# Patient Record
Sex: Female | Born: 1965 | Race: Black or African American | Hispanic: No | Marital: Single | Smoking: Never smoker
Health system: Southern US, Community
[De-identification: ages and names within clinical notes are randomized; demographics above are authoritative.]

## PROBLEM LIST (undated history)

## (undated) DIAGNOSIS — K279 Peptic ulcer, site unspecified, unspecified as acute or chronic, without hemorrhage or perforation: Secondary | ICD-10-CM

## (undated) DIAGNOSIS — F32A Depression, unspecified: Secondary | ICD-10-CM

## (undated) DIAGNOSIS — I10 Essential (primary) hypertension: Secondary | ICD-10-CM

## (undated) DIAGNOSIS — R519 Headache, unspecified: Secondary | ICD-10-CM

## (undated) DIAGNOSIS — F419 Anxiety disorder, unspecified: Secondary | ICD-10-CM

## (undated) DIAGNOSIS — K602 Anal fissure, unspecified: Secondary | ICD-10-CM

## (undated) HISTORY — DX: Anal fissure, unspecified: K60.2

## (undated) HISTORY — PX: ENDOMETRIAL ABLATION: SHX621

## (undated) HISTORY — PX: TONSILLECTOMY: SUR1361

## (undated) HISTORY — DX: Peptic ulcer, site unspecified, unspecified as acute or chronic, without hemorrhage or perforation: K27.9

---

## 1998-10-28 ENCOUNTER — Emergency Department (HOSPITAL_COMMUNITY): Admission: EM | Admit: 1998-10-28 | Discharge: 1998-10-28 | Payer: Self-pay | Admitting: Emergency Medicine

## 1999-06-16 ENCOUNTER — Encounter: Payer: Self-pay | Admitting: Internal Medicine

## 1999-06-16 ENCOUNTER — Ambulatory Visit (HOSPITAL_COMMUNITY): Admission: RE | Admit: 1999-06-16 | Discharge: 1999-06-16 | Payer: Self-pay | Admitting: Internal Medicine

## 1999-06-19 ENCOUNTER — Ambulatory Visit (HOSPITAL_COMMUNITY): Admission: RE | Admit: 1999-06-19 | Discharge: 1999-06-19 | Payer: Self-pay | Admitting: Internal Medicine

## 1999-09-17 ENCOUNTER — Other Ambulatory Visit: Admission: RE | Admit: 1999-09-17 | Discharge: 1999-09-17 | Payer: Self-pay | Admitting: *Deleted

## 2000-03-22 ENCOUNTER — Emergency Department (HOSPITAL_COMMUNITY): Admission: EM | Admit: 2000-03-22 | Discharge: 2000-03-22 | Payer: Self-pay | Admitting: Emergency Medicine

## 2000-04-17 ENCOUNTER — Emergency Department (HOSPITAL_COMMUNITY): Admission: EM | Admit: 2000-04-17 | Discharge: 2000-04-18 | Payer: Self-pay | Admitting: Emergency Medicine

## 2000-05-05 ENCOUNTER — Encounter: Payer: Self-pay | Admitting: Urology

## 2000-05-05 ENCOUNTER — Ambulatory Visit (HOSPITAL_COMMUNITY): Admission: RE | Admit: 2000-05-05 | Discharge: 2000-05-05 | Payer: Self-pay | Admitting: Urology

## 2000-05-10 ENCOUNTER — Other Ambulatory Visit: Admission: RE | Admit: 2000-05-10 | Discharge: 2000-05-10 | Payer: Self-pay | Admitting: Urology

## 2000-12-13 ENCOUNTER — Other Ambulatory Visit: Admission: RE | Admit: 2000-12-13 | Discharge: 2000-12-13 | Payer: Self-pay | Admitting: *Deleted

## 2001-01-06 ENCOUNTER — Other Ambulatory Visit: Admission: RE | Admit: 2001-01-06 | Discharge: 2001-01-06 | Payer: Self-pay | Admitting: *Deleted

## 2001-09-13 ENCOUNTER — Other Ambulatory Visit: Admission: RE | Admit: 2001-09-13 | Discharge: 2001-09-13 | Payer: Self-pay | Admitting: Obstetrics and Gynecology

## 2001-10-18 ENCOUNTER — Encounter: Payer: Self-pay | Admitting: Obstetrics and Gynecology

## 2001-10-18 ENCOUNTER — Ambulatory Visit (HOSPITAL_COMMUNITY): Admission: RE | Admit: 2001-10-18 | Discharge: 2001-10-18 | Payer: Self-pay | Admitting: Obstetrics and Gynecology

## 2001-12-12 ENCOUNTER — Ambulatory Visit (HOSPITAL_COMMUNITY): Admission: RE | Admit: 2001-12-12 | Discharge: 2001-12-12 | Payer: Self-pay | Admitting: Obstetrics and Gynecology

## 2001-12-12 ENCOUNTER — Encounter (INDEPENDENT_AMBULATORY_CARE_PROVIDER_SITE_OTHER): Payer: Self-pay

## 2002-03-17 ENCOUNTER — Encounter: Admission: RE | Admit: 2002-03-17 | Discharge: 2002-03-17 | Payer: Self-pay | Admitting: Occupational Medicine

## 2002-03-17 ENCOUNTER — Emergency Department (HOSPITAL_COMMUNITY): Admission: EM | Admit: 2002-03-17 | Discharge: 2002-03-17 | Payer: Self-pay

## 2002-03-17 ENCOUNTER — Encounter: Payer: Self-pay | Admitting: Occupational Medicine

## 2002-09-13 ENCOUNTER — Other Ambulatory Visit: Admission: RE | Admit: 2002-09-13 | Discharge: 2002-09-13 | Payer: Self-pay | Admitting: Obstetrics and Gynecology

## 2002-09-25 ENCOUNTER — Ambulatory Visit (HOSPITAL_COMMUNITY): Admission: RE | Admit: 2002-09-25 | Discharge: 2002-09-25 | Payer: Self-pay | Admitting: Obstetrics and Gynecology

## 2002-09-25 ENCOUNTER — Encounter: Payer: Self-pay | Admitting: Obstetrics and Gynecology

## 2003-09-17 ENCOUNTER — Other Ambulatory Visit: Admission: RE | Admit: 2003-09-17 | Discharge: 2003-09-17 | Payer: Self-pay | Admitting: Obstetrics and Gynecology

## 2004-09-16 ENCOUNTER — Other Ambulatory Visit: Admission: RE | Admit: 2004-09-16 | Discharge: 2004-09-16 | Payer: Self-pay | Admitting: Obstetrics and Gynecology

## 2005-09-24 ENCOUNTER — Other Ambulatory Visit: Admission: RE | Admit: 2005-09-24 | Discharge: 2005-09-24 | Payer: Self-pay | Admitting: Obstetrics and Gynecology

## 2005-09-30 ENCOUNTER — Ambulatory Visit (HOSPITAL_BASED_OUTPATIENT_CLINIC_OR_DEPARTMENT_OTHER): Admission: RE | Admit: 2005-09-30 | Discharge: 2005-09-30 | Payer: Self-pay | Admitting: Obstetrics and Gynecology

## 2005-10-04 ENCOUNTER — Ambulatory Visit: Payer: Self-pay | Admitting: Internal Medicine

## 2008-02-17 ENCOUNTER — Ambulatory Visit (HOSPITAL_COMMUNITY): Admission: RE | Admit: 2008-02-17 | Discharge: 2008-02-17 | Payer: Self-pay | Admitting: Obstetrics and Gynecology

## 2009-03-07 ENCOUNTER — Encounter: Admission: RE | Admit: 2009-03-07 | Discharge: 2009-03-07 | Payer: Self-pay | Admitting: Obstetrics and Gynecology

## 2009-06-05 ENCOUNTER — Emergency Department (HOSPITAL_COMMUNITY): Admission: EM | Admit: 2009-06-05 | Discharge: 2009-06-05 | Payer: Self-pay | Admitting: Emergency Medicine

## 2009-10-17 ENCOUNTER — Observation Stay (HOSPITAL_COMMUNITY): Admission: EM | Admit: 2009-10-17 | Discharge: 2009-10-18 | Payer: Self-pay | Admitting: Family Medicine

## 2009-10-17 ENCOUNTER — Ambulatory Visit: Payer: Self-pay | Admitting: Gastroenterology

## 2009-10-18 ENCOUNTER — Encounter: Payer: Self-pay | Admitting: Gastroenterology

## 2009-10-22 ENCOUNTER — Encounter: Admission: RE | Admit: 2009-10-22 | Discharge: 2009-10-22 | Payer: Self-pay | Admitting: Family Medicine

## 2010-02-03 ENCOUNTER — Encounter: Admission: RE | Admit: 2010-02-03 | Discharge: 2010-02-03 | Payer: Self-pay | Admitting: Family Medicine

## 2010-03-03 ENCOUNTER — Encounter: Admission: RE | Admit: 2010-03-03 | Discharge: 2010-04-28 | Payer: Self-pay | Admitting: Family Medicine

## 2010-03-10 ENCOUNTER — Encounter: Admission: RE | Admit: 2010-03-10 | Discharge: 2010-03-10 | Payer: Self-pay | Admitting: Family Medicine

## 2010-11-09 ENCOUNTER — Encounter: Payer: Self-pay | Admitting: Family Medicine

## 2010-11-20 NOTE — Procedures (Signed)
Summary: Colonoscopy  Patient: Toni Lewis Note: All result statuses are Final unless otherwise noted.  Tests: (1) Colonoscopy (COL)   COL Colonoscopy           DONE     [facility]     [address] [place], [place]  [address]           COLONOSCOPY PROCEDURE REPORT           PATIENT:  [name], [name]  MR#:  [id number]     BIRTHDATE:  1966/08/21, 43 yrs. old  GENDER:  female     ENDOSCOPIST:  Rachael Fee, MD     PROCEDURE DATE:  10/18/2009     PROCEDURE:  Colonoscopy, Diagnostic     ASA CLASS:  Class I     INDICATIONS:  rectal bleeding, intermittent abd cramping     MEDICATIONS:   Fentanyl 100 mcg IV, Versed 10 mg IV     DESCRIPTION OF PROCEDURE:   After the risks benefits and     alternatives of the procedure were thoroughly explained, informed     consent was obtained.  Digital rectal exam was performed and     revealed no rectal masses.   The EC-[id number] ([id number]) endoscope was     introduced through the anus and advanced to the terminal ileum     which was intubated for a short distance, without limitations.     The quality of the prep was excellent, using MoviPrep.  The     instrument was then slowly withdrawn as the colon was fully     examined.<<PROCEDUREIMAGES>>     FINDINGS: There were a few small diverticulum spread throughout     colon (see image001).  The terminal ileum appeared normal (see     image004).  Internal hemorrhoids were found. These were small to     medium sized, non-thrombosed (see image005).  This was otherwise a     normal examination of the colon (see image002 and image003).     Retroflexed views in the rectum revealed no abnormalities.    The     scope was then withdrawn from the patient and the procedure     completed.           COMPLICATIONS:  None           ENDOSCOPIC IMPRESSION:     1) Mild diverticulosis changes throughout colon     2) Normal terminal ileum; no sign of IBD, Crohn's disease     3) Small to medium sized,  non-thrombosed internal hemorrhoids     4) Otherwise normal examination; no polyps or cancers           RECOMMENDATIONS:     OK to d/c home from GI perspective.     She should increase daily fiber using fiber supplement Citrucel     (start with small spoonfull tomorrow, then gradually increase to     heaping spoonfull over 1 week to decrease bloating that often     occurs after starting fiber).     Local care to hemorrhoids (over the counter preparation H, twice     daily for next 5-7 days).     Return to see Dr. Christella Hartigan at [facility] as needed.           ______________________________     Rachael Fee, MD           n.     eSIGNED:  Rachael Fee at 10/18/2009 08:57 AM           Fredirick Maudlin, [id number]  Note: An exclamation mark (!) indicates a result that was not dispersed into the flowsheet. Document Creation Date: 10/21/2009 9:57 AM _______________________________________________________________________  (1) Order result status: Final Collection or observation date-time: 10/18/2009 08:50 Requested date-time:  Receipt date-time:  Reported date-time:  Referring Physician:   Ordering Physician: Rob Bunting [phone]) Specimen Source:  Source: Launa Grill Order Number: [id number] Lab site:

## 2011-01-19 LAB — CBC
Hemoglobin: 13.1 g/dL (ref 12.0–15.0)
MCHC: 33.2 g/dL (ref 30.0–36.0)
RDW: 14.1 % (ref 11.5–15.5)

## 2011-01-19 LAB — DIFFERENTIAL
Eosinophils Absolute: 0 10*3/uL (ref 0.0–0.7)
Lymphocytes Relative: 44 % (ref 12–46)
Lymphs Abs: 1.5 10*3/uL (ref 0.7–4.0)
Monocytes Absolute: 0.3 10*3/uL (ref 0.1–1.0)
Neutro Abs: 1.6 10*3/uL — ABNORMAL LOW (ref 1.7–7.7)
Neutrophils Relative %: 45 % (ref 43–77)

## 2011-01-19 LAB — URINALYSIS, ROUTINE W REFLEX MICROSCOPIC
Ketones, ur: NEGATIVE mg/dL
Specific Gravity, Urine: 1.026 (ref 1.005–1.030)
pH: 6.5 (ref 5.0–8.0)

## 2011-01-19 LAB — COMPREHENSIVE METABOLIC PANEL
ALT: 17 U/L (ref 0–35)
AST: 23 U/L (ref 0–37)
BUN: 10 mg/dL (ref 6–23)
CO2: 24 mEq/L (ref 19–32)
Calcium: 8.7 mg/dL (ref 8.4–10.5)
Creatinine, Ser: 0.65 mg/dL (ref 0.4–1.2)
GFR calc Af Amer: >60 mL/min (ref >60)
Potassium: 3.9 mEq/L (ref 3.5–5.1)
Sodium: 137 mEq/L (ref 135–145)
Total Bilirubin: 0.8 mg/dL (ref 0.3–1.2)

## 2011-01-19 LAB — ABO/RH: ABO/RH(D): O POS

## 2011-01-19 LAB — BASIC METABOLIC PANEL
CO2: 25 mEq/L (ref 19–32)
Calcium: 8.6 mg/dL (ref 8.4–10.5)
Chloride: 107 mEq/L (ref 96–112)
Creatinine, Ser: 0.74 mg/dL (ref 0.4–1.2)
Glucose, Bld: 94 mg/dL (ref 70–99)
Potassium: 3.6 mEq/L (ref 3.5–5.1)
Sodium: 139 mEq/L (ref 135–145)

## 2011-01-19 LAB — TYPE AND SCREEN: ABO/RH(D): O POS

## 2011-03-06 NOTE — Procedures (Signed)
NAME:  Toni Lewis, Toni Lewis                 ACCOUNT NO.:  [id number]   MEDICAL RECORD NO.:  [id number]          PATIENT TYPE:  OUT   LOCATION:  SLEEP CENTER                 FACILITY:  [facility]   PHYSICIAN:  Clinton D. Maple Hudson, M.D. DATE OF BIRTH:  09/30/1966   DATE OF STUDY:                              NOCTURNAL POLYSOMNOGRAM   REFERRING PHYSICIAN:  Dr. Dierdre Forth.   DATE OF STUDY:  September 30, 2005.   INDICATION FOR STUDY:  Insomnia with sleep apnea. The patient is a third  shift Financial controller. The study was scheduled to accommodate her usual sleep time.   SLEEP ARCHITECTURE:  Lights out at 9:49 a.m. with sleep onset at 9:56 a.m.  Total sleep time 134 minutes with sleep efficiency 38%. Stage I was 14%,  stage II 63%, stages III and IV 8%, REM 15% of total sleep time. Sleep  latency 7 minutes, REM latency 87 minutes, awake after sleep onset 60  minutes with reporting ending at 3:47 p.m. and the patient appeared to be  awake after 1:30 p.m. No medication was reported.   RESPIRATORY DATA:  Only a single hypopnea was noted for an apnea/hypopnea  index of 0.4 per hour which is insignificant.   OXYGEN DATA:  Mild to moderate snoring with oxygen desaturation to a nadir  of 96%. Mean oxygen saturation through the study was 98% on room air.   CARDIAC DATA:  Normal sinus rhythm.   MOVEMENT/PARASOMNIA:  She was noted to be restless. A total of 77 limb jerks  were reported of which 6 were associated with arousal or awakening for  periodic limb movement with arousal index of 2.7 per hour which is slightly  increased.   IMPRESSION/RECOMMENDATIONS:  1.  Her sleep complaints are strongly related to her relatively new third      shift job schedule.  2.  No significant sleep disordered breathing events and only mild sleep      disturbance from leg jerks were noted.  3.  Suggest that emphasis should be on establishment of appropriate sleep      hygiene based around her third shift job. She should maintain  a      consistent sleep schedule 7 days per week. The bedroom should be dark      and protective from noise and day time activities of others around her.      As much as possible, her sleep time should      be protected from scheduled appointments, etc. If necessary, a sleep      medication can be added at her bedtime. Ultimately, she may find that      she is better off changing to a day shift job.      Clinton D. Maple Hudson, M.D.  Diplomate, Biomedical engineer of Sleep Medicine  Electronically Signed     CDY/MEDQ  D:  10/04/2005 17:29:29  T:  10/05/2005 00:55:48  Job:  [id number]

## 2011-03-06 NOTE — Op Note (Signed)
[facility]  Patient:    Toni Lewis, Toni Lewis Visit Number: [id number] MRN: [id number]          Service Type: DSU Location: [facility] Attending Physician:  Shaune Spittle Dictated by:   Maris Berger. Pennie Rushing, M.D. Proc. Date: 12/12/01 Admit Date:  12/12/2001                             Operative Report  PREOPERATIVE DIAGNOSES:       Pelvic pain.  POSTOPERATIVE DIAGNOSES:      Endometriosis.  OPERATION:                    Diagnostic laparoscopy, excision of anterior cul-de-sac endometriosis, ablation of posterior cul-de-sac endometriosis.  SURGEON:                      Vanessa P. Pennie Rushing, M.D.  ANESTHESIA:                   General orotracheal.  ESTIMATED BLOOD LOSS:         Less than 25 cc.  COMPLICATIONS:                None.  FINDINGS:                     The uterus and tubes appeared normal.  The right ovary contained a stigmata thought to be consistent with endometriosis, however, upon excision seemed to be more consistent with a corpus luteum.  The left ovary was without stigmata of endometriosis or without adhesions.  The anterior cul-de-sac contained two powder burn type lesions consistent with endometriosis.  The posterior cul-de-sac contained several erythematous lesions consistent with endometriosis.  On the right and left pelvic side walls overlying the ureters on either side were dark vesicular lesions consistent with endometriosis.  The appendix appeared normal without any evidence of endometriosis grossly.  PROCEDURE:                    The patient was taken to the operating room after appropriate identification and placed on the operating table.  After attainment of adequate general anesthesia she was placed in the modified lithotomy position.  The abdomen and perineum and vagina were prepped with multiple layers of Betadine.  A Foley catheter was inserted into the bladder and connected to straight drainage.  The cervix  required dilation to a #17 dilator to insert the Hulka tenaculum.  The Hulka tenaculum was then inserted. The abdomen was draped as a sterile field.  Subumbilical and suprapubic injection of 0.25% Marcaine for a total of 10 cc was undertaken.  The subumbilical incision was made and the Verres cannula placed through that incision into the peritoneal cavity.  A pneumoperitoneum was created with 3 L of CO2.  The Verres cannula was removed and the laparoscopic trocar placed through that incision into the peritoneal cavity.  The laparoscope was placed through the trocar sleeve.  Suprapubic incisions were made to the right and left of midline and laparoscopic probe trocars placed through those incisions into the peritoneal cavity under direct visualization.  The above noted findings were made and documented.  The anterior cul-de-sac lesions were sharply excised using the hook dissector of the harmonic scalpel.  The posterior cul-de-sac lesions were noted to lie in the posterior cul-de-sac, but then also directly over the ureters.  Hydrodissection could not be carried  out in those areas and those areas were ablated with the harmonic scalpel. Copious irrigation was carried out and approximately 60 cc of warm lactated Ringers left in the peritoneal cavity.  All instruments were then removed under direct visualization as the CO2 was allowed to escape.  The subumbilical and suprapubic incisions were closed with subcuticular sutures of 3-0 Vicryl. Sterile dressings were applied.  The Foley catheter and Hulka tenaculum were removed and the patient awakened from general anesthesia, then taken to the recovery room in satisfactory condition having tolerated the procedure well with sponge and instrument counts correct.  Prior to irrigation the aforementioned right ovarian lesion was excised and removed from the operative field and hemostasis noted to be adequate.  SPECIMEN:                     Right  ovarian excisional biopsy, rule out endometriosis and anterior cul-de-sac excisional biopsy of endometriosis. Dictated by:   Maris Berger. Pennie Rushing, M.D. Attending Physician:  Shaune Spittle DD:  12/12/01 TD:  12/12/01 Job: [id number]

## 2011-03-20 ENCOUNTER — Telehealth: Payer: Self-pay | Admitting: Gastroenterology

## 2011-03-20 ENCOUNTER — Ambulatory Visit (INDEPENDENT_AMBULATORY_CARE_PROVIDER_SITE_OTHER): Payer: BC Managed Care – PPO | Admitting: Gastroenterology

## 2011-03-20 VITALS — BP 132/76 | HR 64 | Ht 66.0 in | Wt 234.0 lb

## 2011-03-20 DIAGNOSIS — K625 Hemorrhage of anus and rectum: Secondary | ICD-10-CM

## 2011-03-20 NOTE — Telephone Encounter (Signed)
Pt added to Dr Christella Hartigan schedule for today Maralyn Sago will notify pt.

## 2011-03-20 NOTE — Progress Notes (Signed)
Review of pertinent gastrointestinal problems: 1. Hemorrhoids: colonoscopy 09/2009 Dr. Christella Hartigan found small to medium sized non thrombosed hemorrhoids, divertiulsosis. Was recommended to use fiber daily, topical ointments as needed for hemorrhoids, recall for routine cancer screening in 10 years.  HPI: This is a very pleasant 45 year old woman whom I last saw about a year and a half ago. See those results summarized above  Has been having bloody diarrhea for about 2 weeks, with red blood clots. Stopped for a day but resumed.  Has gone 3-4 times per day for past week.  She has no significant anal pains.  No hemorroids per recent physical (Dr. Duanne Guess).  Mother had diarrheal illness (much more limited).  Normally she has 2 solid BMs a day, so this is a signficant change.  Recently started relefan for back pains.    Her previous bleeding episode that was attributed to hemorrhoids in 2010 followed heavy ibuprofen use.     Physical Exam: BP 132/76  Pulse 64  Ht 5\' 6"  (1.676 m)  Wt 234 lb (106.142 kg)  BMI 37.77 kg/m2 Constitutional: generally well-appearing Psychiatric: alert and oriented x3 Abdomen: soft, nontender, nondistended, no obvious ascites, no peritoneal signs, normal bowel sounds Rectal examination with female assistant in room found no external anal hemorrhoids, no anal fissures, no clear internal anal hemorrhoids.   Assessment and plan: 45 y.o. female with bloody diarrhea for 2 weeks  She is not really describing typical hemorrhoidal type bleeding. This is more consistent with colitis, whether it be inflammatory or infectious. Indeed her primary care physician also felt she had colitis and started her on ciprofloxacin and Flagyl last week. She should continue those antibiotics and I think we should proceed with flexible sigmoidoscopy to try to firm up the diagnosis. We will get records sent over from her recent labs and her PCP office.

## 2011-03-20 NOTE — Patient Instructions (Signed)
You will be set up for a flexible sigmoidoscopy next week. We will get labs from you PCP sent for review.

## 2011-03-23 ENCOUNTER — Ambulatory Visit (AMBULATORY_SURGERY_CENTER): Payer: BC Managed Care – PPO | Admitting: Gastroenterology

## 2011-03-23 ENCOUNTER — Encounter: Payer: Self-pay | Admitting: Gastroenterology

## 2011-03-23 VITALS — BP 149/100 | HR 92 | Temp 98.9°F | Resp 18 | Ht 66.0 in | Wt 235.0 lb

## 2011-03-23 DIAGNOSIS — K5730 Diverticulosis of large intestine without perforation or abscess without bleeding: Secondary | ICD-10-CM

## 2011-03-23 DIAGNOSIS — R933 Abnormal findings on diagnostic imaging of other parts of digestive tract: Secondary | ICD-10-CM

## 2011-03-23 DIAGNOSIS — K648 Other hemorrhoids: Secondary | ICD-10-CM

## 2011-03-23 DIAGNOSIS — R197 Diarrhea, unspecified: Secondary | ICD-10-CM

## 2011-03-23 DIAGNOSIS — K625 Hemorrhage of anus and rectum: Secondary | ICD-10-CM

## 2011-03-23 DIAGNOSIS — K921 Melena: Secondary | ICD-10-CM

## 2011-03-23 MED ORDER — SODIUM CHLORIDE 0.9 % IV SOLN: 500.0000 mL | INTRAVENOUS | Status: DC

## 2011-03-23 NOTE — Progress Notes (Signed)
Encouraged patient to follow up with BP checks and visit with PCP regarding BP 149/100 . Patient's caregiver stating it's in her family.

## 2011-03-23 NOTE — Patient Instructions (Signed)
Follow the discharge instructions.  Continue your medications.  Await biopsy results.

## 2011-03-24 ENCOUNTER — Telehealth: Payer: Self-pay | Admitting: *Deleted

## 2011-03-24 NOTE — Telephone Encounter (Signed)

## 2011-08-11 ENCOUNTER — Other Ambulatory Visit: Payer: Self-pay | Admitting: Specialist

## 2011-08-11 DIAGNOSIS — M549 Dorsalgia, unspecified: Secondary | ICD-10-CM

## 2011-08-12 ENCOUNTER — Ambulatory Visit
Admission: RE | Admit: 2011-08-12 | Discharge: 2011-08-12 | Disposition: A | Discharge status: Home / Self Care | Payer: Worker's Compensation | Source: Ambulatory Visit | Attending: Specialist | Admitting: Specialist

## 2011-08-12 VITALS — BP 113/61 | HR 87

## 2011-08-12 DIAGNOSIS — M549 Dorsalgia, unspecified: Secondary | ICD-10-CM

## 2011-08-12 MED ORDER — IOHEXOL 180 MG/ML  SOLN
18.0000 mL | Freq: Once | INTRAMUSCULAR | Status: AC | PRN
  Administered 2011-08-12: 18 mL via INTRATHECAL

## 2011-08-12 MED ORDER — ONDANSETRON HCL 4 MG/2ML IJ SOLN: 4.0000 mg | Freq: Four times a day (QID) | INTRAMUSCULAR | Status: DC | PRN

## 2011-08-12 MED ORDER — DIAZEPAM 5 MG PO TABS
10.0000 mg | ORAL_TABLET | Freq: Once | ORAL | Status: AC
  Administered 2011-08-12: 10 mg via ORAL

## 2011-08-12 NOTE — Patient Instructions (Signed)
Myelogram   Discharge Instructions  1. Go home and rest quietly for the next 24 hours.  It is important to lie flat for the next 24 hours.  Get up only to go to the restroom.  You may lie in the bed or on a couch on your back, your stomach, your left side or your right side.  You may have one pillow under your head.  You may have pillows between your knees while you are on your side or under your knees while you are on your back.  2. DO NOT drive today.  Recline the seat as far back as it will go, while still wearing your seat belt, on the way home.  3. You may get up to go to the bathroom as needed.  You may sit up for 10 minutes to eat.  You may resume your normal diet and medications unless otherwise indicated.  4. The incidence of headache, nausea, or vomiting is about 5% (one in 20 patients).  If you develop a headache, lie flat and drink plenty of fluids until the headache goes away.  Caffeinated beverages may be helpful.  If you develop severe nausea and vomiting or a headache that does not go away with flat bed rest, call [phone].  5. You may resume normal activities after your 24 hours of bed rest is over; however, do not exert yourself strongly or do any heavy lifting tomorrow.  6. Call your physician for a follow-up appointment.  The results of your myelogram will be sent directly to your physician by the following day.  7. If you have any questions or if complications develop after you arrive home, please call [phone].  Discharge instructions have been explained to the patient.  The patient, or the person responsible for the patient, fully understands these instructions.  

## 2012-02-26 ENCOUNTER — Other Ambulatory Visit: Payer: Self-pay | Admitting: Neurological Surgery

## 2012-03-07 ENCOUNTER — Encounter (HOSPITAL_COMMUNITY): Payer: Self-pay | Admitting: Pharmacy Technician

## 2012-03-08 ENCOUNTER — Encounter (HOSPITAL_COMMUNITY): Payer: Self-pay

## 2012-03-08 ENCOUNTER — Inpatient Hospital Stay (HOSPITAL_COMMUNITY): Admission: RE | Admit: 2012-03-08 | Discharge: 2012-03-08 | Payer: Worker's Compensation | Source: Ambulatory Visit

## 2012-03-08 HISTORY — DX: Essential (primary) hypertension: I10

## 2012-03-08 NOTE — Pre-Procedure Instructions (Signed)
20 JOCELYN NOLD  03/08/2012   Your procedure is scheduled on:  03/18/12  Report to [facility] at 5:30 AM.  Call this number if you have problems the morning of surgery: [phone]   Remember: discontinue Aspirin, Coumadin, Plavix, Effient and herbal medications.   Do not eat food:After Midnight.  May have clear liquids: up to 4 Hours before arrival (1:30 AM).  Clear liquids include soda, tea, black coffee, apple or grape juice, broth.  Take these medicines the morning of surgery with A SIP OF WATER: Flexeril   Do not wear jewelry, make-up or nail polish.  Do not wear lotions, powders, or perfumes. You may wear deodorant.  Do not shave 48 hours prior to surgery. Men may shave face and neck.  Do not bring valuables to the hospital.  Contacts, dentures or bridgework may not be worn into surgery.  Leave suitcase in the car. After surgery it may be brought to your room.  For patients admitted to the hospital, checkout time is 11:00 AM the day of discharge.   Patients discharged the day of surgery will not be allowed to drive home.    Special Instructions: CHG Shower Use Special Wash: 1/2 bottle night before surgery and 1/2 bottle morning of surgery.   Please read over the following fact sheets that you were given: Pain Booklet, Coughing and Deep Breathing, Blood Transfusion Information, MRSA Information and Surgical Site Infection Prevention

## 2012-03-08 NOTE — Progress Notes (Signed)
Confirmed PAT Lab appt.

## 2012-03-16 ENCOUNTER — Encounter (HOSPITAL_COMMUNITY)
Admission: RE | Admit: 2012-03-16 | Discharge: 2012-03-16 | Disposition: A | Discharge status: Home / Self Care | Payer: Worker's Compensation | Source: Ambulatory Visit | Attending: Neurological Surgery | Admitting: Neurological Surgery

## 2012-03-16 ENCOUNTER — Ambulatory Visit (HOSPITAL_COMMUNITY)
Admission: RE | Admit: 2012-03-16 | Discharge: 2012-03-16 | Disposition: A | Discharge status: Home / Self Care | Payer: Worker's Compensation | Source: Ambulatory Visit | Attending: Anesthesiology | Admitting: Anesthesiology

## 2012-03-16 DIAGNOSIS — Z01818 Encounter for other preprocedural examination: Secondary | ICD-10-CM | POA: Insufficient documentation

## 2012-03-16 DIAGNOSIS — M4120 Other idiopathic scoliosis, site unspecified: Secondary | ICD-10-CM | POA: Insufficient documentation

## 2012-03-16 LAB — CBC
Hemoglobin: 13.7 g/dL (ref 12.0–15.0)
Platelets: 281 10*3/uL (ref 150–400)
RBC: 4.83 MIL/uL (ref 3.87–5.11)
WBC: 5.5 10*3/uL (ref 4.0–10.5)

## 2012-03-16 LAB — TYPE AND SCREEN
ABO/RH(D): O POS
Antibody Screen: NEGATIVE

## 2012-03-16 LAB — BASIC METABOLIC PANEL
Calcium: 9.8 mg/dL (ref 8.4–10.5)
GFR calc Af Amer: >90 mL/min (ref >90)
GFR calc non Af Amer: 82 mL/min — ABNORMAL LOW (ref >90)
Glucose, Bld: 99 mg/dL (ref 70–99)
Potassium: 3.6 mEq/L (ref 3.5–5.1)
Sodium: 136 mEq/L (ref 135–145)

## 2012-03-16 LAB — ABO/RH: ABO/RH(D): O POS

## 2012-03-16 LAB — SURGICAL PCR SCREEN: Staphylococcus aureus: NEGATIVE

## 2012-03-17 MED ORDER — CEFAZOLIN SODIUM 1-5 GM-% IV SOLN
1.0000 g | INTRAVENOUS | Status: AC
  Administered 2012-03-18: 2 g via INTRAVENOUS
  Filled 2012-03-17: qty 50

## 2012-03-18 ENCOUNTER — Encounter (HOSPITAL_COMMUNITY): Payer: Self-pay | Admitting: *Deleted

## 2012-03-18 ENCOUNTER — Encounter (HOSPITAL_COMMUNITY)
Admission: RE | Disposition: A | Discharge status: Home / Self Care | Payer: Self-pay | Source: Ambulatory Visit | Attending: Neurological Surgery

## 2012-03-18 ENCOUNTER — Ambulatory Visit (HOSPITAL_COMMUNITY): Payer: Worker's Compensation

## 2012-03-18 ENCOUNTER — Ambulatory Visit (HOSPITAL_COMMUNITY): Payer: Worker's Compensation | Admitting: Anesthesiology

## 2012-03-18 ENCOUNTER — Inpatient Hospital Stay (HOSPITAL_COMMUNITY)
Admission: RE | Admit: 2012-03-18 | Discharge: 2012-03-21 | DRG: 460 | Disposition: A | Discharge status: Home / Self Care | Payer: Worker's Compensation | Source: Ambulatory Visit | Attending: Neurological Surgery | Admitting: Neurological Surgery

## 2012-03-18 ENCOUNTER — Encounter (HOSPITAL_COMMUNITY): Payer: Self-pay | Admitting: Anesthesiology

## 2012-03-18 DIAGNOSIS — M51379 Other intervertebral disc degeneration, lumbosacral region without mention of lumbar back pain or lower extremity pain: Secondary | ICD-10-CM | POA: Diagnosis present

## 2012-03-18 DIAGNOSIS — Y92410 Unspecified street and highway as the place of occurrence of the external cause: Secondary | ICD-10-CM

## 2012-03-18 DIAGNOSIS — K219 Gastro-esophageal reflux disease without esophagitis: Secondary | ICD-10-CM | POA: Diagnosis present

## 2012-03-18 DIAGNOSIS — Z8249 Family history of ischemic heart disease and other diseases of the circulatory system: Secondary | ICD-10-CM

## 2012-03-18 DIAGNOSIS — M4716 Other spondylosis with myelopathy, lumbar region: Secondary | ICD-10-CM

## 2012-03-18 DIAGNOSIS — M47817 Spondylosis without myelopathy or radiculopathy, lumbosacral region: Secondary | ICD-10-CM | POA: Diagnosis present

## 2012-03-18 DIAGNOSIS — M5126 Other intervertebral disc displacement, lumbar region: Principal | ICD-10-CM | POA: Diagnosis present

## 2012-03-18 DIAGNOSIS — M5137 Other intervertebral disc degeneration, lumbosacral region: Secondary | ICD-10-CM | POA: Diagnosis present

## 2012-03-18 SURGERY — POSTERIOR LUMBAR FUSION 1 LEVEL
Anesthesia: General | Site: Back | Wound class: Clean

## 2012-03-18 MED ORDER — HETASTARCH-ELECTROLYTES 6 % IV SOLN
INTRAVENOUS | Status: DC | PRN
  Administered 2012-03-18: 09:00:00 via INTRAVENOUS

## 2012-03-18 MED ORDER — SODIUM CHLORIDE 0.9 % IJ SOLN
3.0000 mL | INTRAMUSCULAR | Status: DC | PRN
  Administered 2012-03-20: 3 mL via INTRAVENOUS

## 2012-03-18 MED ORDER — BACITRACIN 50000 UNITS IM SOLR
INTRAMUSCULAR | Status: AC
  Filled 2012-03-18: qty 1

## 2012-03-18 MED ORDER — HYDROMORPHONE HCL PF 1 MG/ML IJ SOLN
INTRAMUSCULAR | Status: AC
  Administered 2012-03-18: 0.5 mg via INTRAVENOUS
  Filled 2012-03-18: qty 1

## 2012-03-18 MED ORDER — MIDAZOLAM HCL 5 MG/5ML IJ SOLN
INTRAMUSCULAR | Status: DC | PRN
  Administered 2012-03-18: 2 mg via INTRAVENOUS

## 2012-03-18 MED ORDER — LIDOCAINE-EPINEPHRINE 1 %-1:100000 IJ SOLN
INTRAMUSCULAR | Status: DC | PRN
  Administered 2012-03-18: 10 mL

## 2012-03-18 MED ORDER — DEXTROSE 5 % IV SOLN
INTRAVENOUS | Status: DC | PRN
  Administered 2012-03-18: 08:00:00 via INTRAVENOUS

## 2012-03-18 MED ORDER — CYCLOBENZAPRINE HCL 10 MG PO TABS
10.0000 mg | ORAL_TABLET | Freq: Two times a day (BID) | ORAL | Status: DC | PRN
  Administered 2012-03-19 – 2012-03-21 (×4): 10 mg via ORAL
  Filled 2012-03-18 (×5): qty 1

## 2012-03-18 MED ORDER — 0.9 % SODIUM CHLORIDE (POUR BTL) OPTIME
TOPICAL | Status: DC | PRN
  Administered 2012-03-18: 1000 mL

## 2012-03-18 MED ORDER — HYDROCHLOROTHIAZIDE 12.5 MG PO CAPS
12.5000 mg | ORAL_CAPSULE | Freq: Every day | ORAL | Status: DC
  Filled 2012-03-18 (×4): qty 1

## 2012-03-18 MED ORDER — ALBUMIN HUMAN 5 % IV SOLN
INTRAVENOUS | Status: DC | PRN
  Administered 2012-03-18: 11:00:00 via INTRAVENOUS

## 2012-03-18 MED ORDER — MENTHOL 3 MG MT LOZG: 1.0000 | LOZENGE | OROMUCOSAL | Status: DC | PRN

## 2012-03-18 MED ORDER — ACETAMINOPHEN 650 MG RE SUPP: 650.0000 mg | RECTAL | Status: DC | PRN

## 2012-03-18 MED ORDER — SODIUM CHLORIDE 0.9 % IV SOLN
INTRAVENOUS | Status: AC
  Filled 2012-03-18: qty 500

## 2012-03-18 MED ORDER — THROMBIN 20000 UNITS EX KIT
PACK | CUTANEOUS | Status: DC | PRN
  Administered 2012-03-18: 09:00:00 via TOPICAL

## 2012-03-18 MED ORDER — KETOROLAC TROMETHAMINE 15 MG/ML IJ SOLN
15.0000 mg | Freq: Three times a day (TID) | INTRAMUSCULAR | Status: DC | PRN
  Administered 2012-03-18: 15 mg via INTRAVENOUS
  Filled 2012-03-18: qty 1

## 2012-03-18 MED ORDER — PROPOFOL 10 MG/ML IV EMUL
INTRAVENOUS | Status: DC | PRN
  Administered 2012-03-18: 120 mg via INTRAVENOUS

## 2012-03-18 MED ORDER — FENTANYL CITRATE 0.05 MG/ML IJ SOLN
INTRAMUSCULAR | Status: DC | PRN
  Administered 2012-03-18 (×4): 100 ug via INTRAVENOUS
  Administered 2012-03-18 (×2): 50 ug via INTRAVENOUS

## 2012-03-18 MED ORDER — CEFAZOLIN SODIUM 1-5 GM-% IV SOLN
1.0000 g | Freq: Three times a day (TID) | INTRAVENOUS | Status: AC
  Administered 2012-03-18 – 2012-03-19 (×2): 1 g via INTRAVENOUS
  Filled 2012-03-18 (×2): qty 50

## 2012-03-18 MED ORDER — HYDROMORPHONE HCL PF 1 MG/ML IJ SOLN
0.2500 mg | INTRAMUSCULAR | Status: DC | PRN
  Administered 2012-03-18 (×4): 0.5 mg via INTRAVENOUS

## 2012-03-18 MED ORDER — SODIUM CHLORIDE 0.9 % IV SOLN
250.0000 mL | INTRAVENOUS | Status: DC
  Administered 2012-03-19: 1000 mL via INTRAVENOUS

## 2012-03-18 MED ORDER — SODIUM CHLORIDE 0.9 % IR SOLN
Status: DC | PRN
  Administered 2012-03-18: 09:00:00

## 2012-03-18 MED ORDER — LOSARTAN POTASSIUM-HCTZ 50-12.5 MG PO TABS: 1.0000 | ORAL_TABLET | Freq: Every day | ORAL | Status: DC

## 2012-03-18 MED ORDER — OXYCODONE-ACETAMINOPHEN 5-325 MG PO TABS
ORAL_TABLET | ORAL | Status: AC
  Administered 2012-03-18: 2 via ORAL
  Filled 2012-03-18: qty 2

## 2012-03-18 MED ORDER — KETOROLAC TROMETHAMINE 30 MG/ML IJ SOLN
INTRAMUSCULAR | Status: AC
  Filled 2012-03-18: qty 1

## 2012-03-18 MED ORDER — LACTATED RINGERS IV SOLN
INTRAVENOUS | Status: DC | PRN
  Administered 2012-03-18 (×3): via INTRAVENOUS

## 2012-03-18 MED ORDER — ALUM & MAG HYDROXIDE-SIMETH 200-200-20 MG/5ML PO SUSP: 30.0000 mL | Freq: Four times a day (QID) | ORAL | Status: DC | PRN

## 2012-03-18 MED ORDER — ONDANSETRON HCL 4 MG/2ML IJ SOLN: 4.0000 mg | Freq: Once | INTRAMUSCULAR | Status: DC | PRN

## 2012-03-18 MED ORDER — ACETAMINOPHEN 325 MG PO TABS: 650.0000 mg | ORAL_TABLET | ORAL | Status: DC | PRN

## 2012-03-18 MED ORDER — LIDOCAINE HCL (CARDIAC) 20 MG/ML IV SOLN
INTRAVENOUS | Status: DC | PRN
  Administered 2012-03-18: 50 mg via INTRAVENOUS

## 2012-03-18 MED ORDER — PHENOL 1.4 % MT LIQD: 1.0000 | OROMUCOSAL | Status: DC | PRN

## 2012-03-18 MED ORDER — OXYCODONE-ACETAMINOPHEN 5-325 MG PO TABS
1.0000 | ORAL_TABLET | ORAL | Status: DC | PRN
  Administered 2012-03-18 (×2): 2 via ORAL
  Administered 2012-03-19 (×2): 1 via ORAL
  Administered 2012-03-19 – 2012-03-20 (×3): 2 via ORAL
  Administered 2012-03-20: 1 via ORAL
  Administered 2012-03-21 (×2): 2 via ORAL
  Filled 2012-03-18 (×3): qty 2
  Filled 2012-03-18 (×2): qty 1
  Filled 2012-03-18 (×4): qty 2

## 2012-03-18 MED ORDER — ROCURONIUM BROMIDE 100 MG/10ML IV SOLN
INTRAVENOUS | Status: DC | PRN
  Administered 2012-03-18: 20 mg via INTRAVENOUS
  Administered 2012-03-18: 50 mg via INTRAVENOUS
  Administered 2012-03-18 (×2): 10 mg via INTRAVENOUS

## 2012-03-18 MED ORDER — GLYCOPYRROLATE 0.2 MG/ML IJ SOLN
INTRAMUSCULAR | Status: DC | PRN
  Administered 2012-03-18: .5 mg via INTRAVENOUS

## 2012-03-18 MED ORDER — ONDANSETRON HCL 4 MG/2ML IJ SOLN
4.0000 mg | INTRAMUSCULAR | Status: DC | PRN
  Administered 2012-03-18 – 2012-03-19 (×3): 4 mg via INTRAVENOUS
  Filled 2012-03-18 (×3): qty 2

## 2012-03-18 MED ORDER — LOSARTAN POTASSIUM 50 MG PO TABS
50.0000 mg | ORAL_TABLET | Freq: Every day | ORAL | Status: DC
  Filled 2012-03-18 (×4): qty 1

## 2012-03-18 MED ORDER — BUPIVACAINE HCL (PF) 0.5 % IJ SOLN
INTRAMUSCULAR | Status: DC | PRN
  Administered 2012-03-18: 10 mL

## 2012-03-18 MED ORDER — PROMETHAZINE HCL 25 MG/ML IJ SOLN
25.0000 mg | Freq: Four times a day (QID) | INTRAMUSCULAR | Status: DC | PRN
  Administered 2012-03-19 – 2012-03-21 (×5): 25 mg via INTRAMUSCULAR
  Filled 2012-03-18 (×6): qty 1

## 2012-03-18 MED ORDER — DEXAMETHASONE SODIUM PHOSPHATE 4 MG/ML IJ SOLN
INTRAMUSCULAR | Status: DC | PRN
  Administered 2012-03-18: 8 mg via INTRAVENOUS

## 2012-03-18 MED ORDER — NEOSTIGMINE METHYLSULFATE 1 MG/ML IJ SOLN
INTRAMUSCULAR | Status: DC | PRN
  Administered 2012-03-18: 4 mg via INTRAVENOUS

## 2012-03-18 MED ORDER — SODIUM CHLORIDE 0.9 % IJ SOLN
3.0000 mL | Freq: Two times a day (BID) | INTRAMUSCULAR | Status: DC
  Administered 2012-03-18 – 2012-03-20 (×3): 3 mL via INTRAVENOUS

## 2012-03-18 MED ORDER — MORPHINE SULFATE 2 MG/ML IJ SOLN
1.0000 mg | INTRAMUSCULAR | Status: DC | PRN
  Administered 2012-03-18 (×3): 2 mg via INTRAVENOUS
  Administered 2012-03-19: 4 mg via INTRAVENOUS
  Administered 2012-03-19 (×4): 2 mg via INTRAVENOUS
  Filled 2012-03-18 (×7): qty 1
  Filled 2012-03-18: qty 2

## 2012-03-18 MED ORDER — SODIUM CHLORIDE 0.9 % IV SOLN
INTRAVENOUS | Status: DC
  Administered 2012-03-18: 1000 mL via INTRAVENOUS

## 2012-03-18 MED ORDER — CEFAZOLIN SODIUM-DEXTROSE 2-3 GM-% IV SOLR
INTRAVENOUS | Status: AC
  Filled 2012-03-18: qty 50

## 2012-03-18 SURGICAL SUPPLY — 63 items
ADH SKN CLS APL DERMABOND .7 (GAUZE/BANDAGES/DRESSINGS) ×1
BAG DECANTER FOR FLEXI CONT (MISCELLANEOUS) ×2 IMPLANT
BLADE SURG ROTATE 9660 (MISCELLANEOUS) IMPLANT
BUR MATCHSTICK NEURO 3.0 LAGG (BURR) ×2 IMPLANT
CANISTER SUCTION 2500CC (MISCELLANEOUS) ×2 IMPLANT
CLOTH BEACON ORANGE TIMEOUT ST (SAFETY) ×2 IMPLANT
CONT SPEC 4OZ CLIKSEAL STRL BL (MISCELLANEOUS) ×4 IMPLANT
COVER BACK TABLE 24X17X13 BIG (DRAPES) IMPLANT
COVER TABLE BACK 60X90 (DRAPES) ×2 IMPLANT
DECANTER SPIKE VIAL GLASS SM (MISCELLANEOUS) ×2 IMPLANT
DERMABOND ADVANCED (GAUZE/BANDAGES/DRESSINGS) ×1
DERMABOND ADVANCED .7 DNX12 (GAUZE/BANDAGES/DRESSINGS) ×1 IMPLANT
DRAPE C-ARM 42X72 X-RAY (DRAPES) ×4 IMPLANT
DRAPE LAPAROTOMY 100X72X124 (DRAPES) ×2 IMPLANT
DRAPE POUCH INSTRU U-SHP 10X18 (DRAPES) ×2 IMPLANT
DRAPE PROXIMA HALF (DRAPES) IMPLANT
DURAPREP 26ML APPLICATOR (WOUND CARE) ×2 IMPLANT
ELECT BLADE 4.0 EZ CLEAN MEGAD (MISCELLANEOUS) ×2
ELECT REM PT RETURN 9FT ADLT (ELECTROSURGICAL) ×2
ELECTRODE BLDE 4.0 EZ CLN MEGD (MISCELLANEOUS) IMPLANT
ELECTRODE REM PT RTRN 9FT ADLT (ELECTROSURGICAL) ×1 IMPLANT
GAUZE SPONGE 4X4 16PLY XRAY LF (GAUZE/BANDAGES/DRESSINGS) ×1 IMPLANT
GLOVE BIOGEL PI IND STRL 8 (GLOVE) IMPLANT
GLOVE BIOGEL PI IND STRL 8.5 (GLOVE) ×2 IMPLANT
GLOVE BIOGEL PI INDICATOR 8 (GLOVE) ×2
GLOVE BIOGEL PI INDICATOR 8.5 (GLOVE) ×2
GLOVE ECLIPSE 6.5 STRL STRAW (GLOVE) ×1 IMPLANT
GLOVE ECLIPSE 7.5 STRL STRAW (GLOVE) ×3 IMPLANT
GLOVE ECLIPSE 8.5 STRL (GLOVE) ×4 IMPLANT
GLOVE EXAM NITRILE LRG STRL (GLOVE) IMPLANT
GLOVE EXAM NITRILE MD LF STRL (GLOVE) ×1 IMPLANT
GLOVE EXAM NITRILE XL STR (GLOVE) IMPLANT
GLOVE EXAM NITRILE XS STR PU (GLOVE) IMPLANT
GOWN BRE IMP SLV AUR LG STRL (GOWN DISPOSABLE) IMPLANT
GOWN BRE IMP SLV AUR XL STRL (GOWN DISPOSABLE) IMPLANT
GOWN STRL REIN 2XL LVL4 (GOWN DISPOSABLE) ×2 IMPLANT
KIT BASIN OR (CUSTOM PROCEDURE TRAY) ×2 IMPLANT
KIT ROOM TURNOVER OR (KITS) ×2 IMPLANT
MILL MEDIUM DISP (BLADE) ×1 IMPLANT
NEEDLE HYPO 22GX1.5 SAFETY (NEEDLE) ×2 IMPLANT
NS IRRIG 1000ML POUR BTL (IV SOLUTION) ×2 IMPLANT
PACK FOAM VITOSS 10CC (Orthopedic Implant) ×1 IMPLANT
PACK LAMINECTOMY NEURO (CUSTOM PROCEDURE TRAY) ×2 IMPLANT
PAD ARMBOARD 7.5X6 YLW CONV (MISCELLANEOUS) ×6 IMPLANT
PATTIES SURGICAL .5 X1 (DISPOSABLE) ×2 IMPLANT
PEEK PLIF NOVEL 9X25X10 (Peek) ×1 IMPLANT
ROD 30MM (Rod) ×2 IMPLANT
SCREW 40MM (Screw) ×2 IMPLANT
SCREW BONE SPINE 30MM (Screw) ×2 IMPLANT
SCREW SET SPINAL STD HEXALOBE (Screw) ×4 IMPLANT
SPONGE GAUZE 4X4 12PLY (GAUZE/BANDAGES/DRESSINGS) ×2 IMPLANT
SPONGE LAP 4X18 X RAY DECT (DISPOSABLE) IMPLANT
SPONGE SURGIFOAM ABS GEL 100 (HEMOSTASIS) ×2 IMPLANT
SUT VIC AB 1 CT1 18XBRD ANBCTR (SUTURE) ×1 IMPLANT
SUT VIC AB 1 CT1 8-18 (SUTURE) ×4
SUT VIC AB 2-0 CP2 18 (SUTURE) ×3 IMPLANT
SUT VIC AB 3-0 SH 8-18 (SUTURE) ×2 IMPLANT
SYR 20ML ECCENTRIC (SYRINGE) ×2 IMPLANT
TOWEL OR 17X24 6PK STRL BLUE (TOWEL DISPOSABLE) ×1 IMPLANT
TOWEL OR 17X26 10 PK STRL BLUE (TOWEL DISPOSABLE) ×1 IMPLANT
TRAP SPECIMEN MUCOUS 40CC (MISCELLANEOUS) ×2 IMPLANT
TRAY FOLEY CATH 14FRSI W/METER (CATHETERS) ×2 IMPLANT
WATER STERILE IRR 1000ML POUR (IV SOLUTION) ×2 IMPLANT

## 2012-03-18 NOTE — Progress Notes (Signed)
Patient ID: Toni Lewis, female   DOB: 1966/01/11, 46 y.o.   MRN: [id number] Vital signs stable patient is awake alert some nausea secondary to oral pain medication has been receiving Zofran. I'll write for Phenergan which seems to have a better effect with her pain medication. A letter ambulate next day or 2 in preparation for discharge. Dressing is clean and dry today.

## 2012-03-18 NOTE — Anesthesia Postprocedure Evaluation (Signed)
Anesthesia Post Note  Patient: Toni Lewis  Procedure(s) Performed: Procedure(s) (LRB): POSTERIOR LUMBAR FUSION 1 LEVEL (N/A)  Anesthesia type: general  Patient location: PACU  Post pain: Pain level controlled  Post assessment: Patient's Cardiovascular Status Stable  Last Vitals:  Filed Vitals:   03/18/12 1145  BP: 114/61  Pulse: 99  Temp:   Resp: 22    Post vital signs: Reviewed and stable  Level of consciousness: sedated  Complications: No apparent anesthesia complications
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
## 2012-03-18 NOTE — Anesthesia Preprocedure Evaluation (Addendum)
Anesthesia Evaluation  Patient identified by MRN, date of birth, ID band  Reviewed: Allergy & Precautions, H&P , NPO status , Patient's Chart, lab work & pertinent test results  History of Anesthesia Complications Negative for: history of anesthetic complications  Airway Mallampati: I TM Distance: >3 FB Neck ROM: Full    Dental  (+) Teeth Intact and Dental Advisory Given   Pulmonary neg pulmonary ROS,          Cardiovascular hypertension, Pt. on medications     Neuro/Psych negative neurological ROS  negative psych ROS   GI/Hepatic Neg liver ROS, PUD,   Endo/Other  negative endocrine ROS  Renal/GU negative Renal ROS     Musculoskeletal negative musculoskeletal ROS (+)   Abdominal   Peds  Hematology negative hematology ROS (+)   Anesthesia Other Findings   Reproductive/Obstetrics negative OB ROS                         Anesthesia Physical Anesthesia Plan  ASA: II  Anesthesia Plan: General   Post-op Pain Management:    Induction: Intravenous  Airway Management Planned: Oral ETT  Additional Equipment:   Intra-op Plan:   Post-operative Plan: Extubation in OR  Informed Consent: I have reviewed the patients History and Physical, chart, labs and discussed the procedure including the risks, benefits and alternatives for the proposed anesthesia with the patient or authorized representative who has indicated his/her understanding and acceptance.   Dental advisory given  Plan Discussed with: CRNA and Surgeon  Anesthesia Plan Comments:        Anesthesia Quick Evaluation

## 2012-03-18 NOTE — Preoperative (Signed)
Beta Blockers   Reason not to administer Beta Blockers:Not Applicable 

## 2012-03-18 NOTE — Op Note (Signed)
Procedure: L5 is decompressive laminectomy decompression of L5 and S1 nerve roots, posterior lumbar interbody arthrodesis with peek spacers local autograft and allograft, pedicle screw fixation L5-S1, posterior lateral arthrodesis L5-S1  Surgeon: Barnett Abu M.D.  Asst.: Barbaraann Barthel M.D.  Indications: Patient is Toni Lewis is a 46 y.o. female who who's had significant back pain and lumbar radiculopathy for over a years period time. A lumbar myelogram demonstrates advanced spondylolisthesis with high-grade canal stenosis. she was advised regarding surgical intervention.  Procedure: The patient was brought to the operating room supine on a stretcher. After the smooth induction of general endotracheal anesthesia she was turned prone and the back was prepped with alcohol and DuraPrep. The back was then draped sterilely. A midline incision was created and carried down to the lumbar dorsal fascia. A localizing radiograph identified the S1 and L5 spinous processes. A subligamentous dissection was created at S1 and L5 to expose the interlaminar space at S1 and L5 and the facet joints over the L5-S1 interspace. Laminotomies were were then created removing the entire inferior margin of the lamina of L5 including the inferior facet at the L5-S1 joint. The yellow ligament was taken up and the common dural tube was exposed along with the L5 nerve root superiorly, and the S1 nerve root inferiorly, the disc space was exposed and epidural veins in this region were cauterized and divided. The L5 nerve roots and the S1 nerve root were dissected with care taken to protect them. The disc space was opened and a combination of curettes and rongeurs was used to evacuate the disc space fully. The endplates were removed using sharp curettes. An interbody spacer was placed to distract the disc space while the contralateral discectomy was performed. When the entirety of the disc was removed and the endplates were prepared final  sizing of the disc space was obtained 10 mm peek spacers were chosen and packed with autograft and allograft and placed into the interspace. The remainder of the interspace was packed with autograft and allograft. Pedicle entry sites were then chosen using fluoroscopic guidance and 5.5 x 30 mm screws were placed in L5 and 6.5 x 40 mm screws were placed in S1. The lateral gutters were decorticated and graft was packed in the posterolateral gutters between S1 and L5. Final radiographs were obtained after placing appropriately sized rods between the pedicle screws at L5-S1 and torquing these to the appropriate tension. The surgical site was inspected carefully to assure the S1 and L5 nerve roots were well decompressed, hemostasis was obtained, and the graft was well packed. Then the retractors were removed and the wound was closed with #1 Vicryl in the lumbar dorsal fascia 2-0 Vicryl in the subcutaneous tissue and 3-0 Vicryl subcuticularly. When he cc of half percent Marcaine was injected into the paraspinous musculature at the time of closure. Blood loss was estimated at 250 cc. The patient tolerated procedure well and was returned to the recovery room in stable condition.

## 2012-03-18 NOTE — Transfer of Care (Signed)
Immediate Anesthesia Transfer of Care Note  Patient: Toni Lewis  Procedure(s) Performed: Procedure(s) (LRB): POSTERIOR LUMBAR FUSION 1 LEVEL (N/A)  Patient Location: PACU  Anesthesia Type: General  Level of Consciousness: awake, alert  and oriented  Airway & Oxygen Therapy: Patient Spontanous Breathing and Patient connected to nasal cannula oxygen  Post-op Assessment: Report given to PACU RN and Post -op Vital signs reviewed and stable  Post vital signs: Reviewed and stable  Complications: No apparent anesthesia complications

## 2012-03-18 NOTE — Anesthesia Procedure Notes (Signed)
Procedure Name: Intubation Date/Time: 03/18/2012 7:57 AM Performed by: Nicholos Johns Pre-anesthesia Checklist: Patient identified, Emergency Drugs available, Suction available, Patient being monitored and Timeout performed Patient Re-evaluated:Patient Re-evaluated prior to inductionOxygen Delivery Method: Circle system utilized Preoxygenation: Pre-oxygenation with 100% oxygen Intubation Type: IV induction Ventilation: Mask ventilation without difficulty Laryngoscope Size: Mac and 3 Grade View: Grade I Tube type: Oral Tube size: 7.5 mm Number of attempts: 1 Airway Equipment and Method: Stylet Placement Confirmation: ETT inserted through vocal cords under direct vision,  positive ETCO2 and breath sounds checked- equal and bilateral Secured at: 22 cm Tube secured with: Tape Dental Injury: Teeth and Oropharynx as per pre-operative assessment

## 2012-03-19 MED ORDER — MANAGING BACK PAIN BOOK
Freq: Once | Status: AC
  Administered 2012-03-19: 02:00:00
  Filled 2012-03-19: qty 1

## 2012-03-19 MED ORDER — DEXAMETHASONE 4 MG PO TABS
4.0000 mg | ORAL_TABLET | Freq: Three times a day (TID) | ORAL | Status: AC
  Administered 2012-03-19 – 2012-03-20 (×3): 4 mg via ORAL
  Filled 2012-03-19 (×3): qty 1

## 2012-03-19 NOTE — Progress Notes (Signed)
Patient ID: Toni Lewis, female   DOB: 01/19/1966, 46 y.o.   MRN: [id number] Subjective: Patient reports numbness of right foot  Objective: Vital signs in last 24 hours: Temp:  [97.4 F (36.3 C)-98.4 F (36.9 C)] 97.6 F (36.4 C) (06/01 0600) Pulse Rate:  [87-102] 99  (06/01 0600) Resp:  [14-22] 18  (06/01 0600) BP: (91-128)/(52-71) 111/71 mmHg (06/01 0600) SpO2:  [95 %-100 %] 98 % (06/01 0600) Weight:  [102.059 kg (225 lb)] 102.059 kg (225 lb) (05/31 1300)  Intake/Output from previous day: 05/31 0701 - 06/01 0700 In: 3007 [P.O.:200; I.V.:2053; IV Piggyback:754] Out: 1395 [Urine:1195; Blood:200] Intake/Output this shift:    Strength is 5/5. She does have some decreased sensation in right foot.  Lab Results:  Basename 03/16/12 1039  WBC 5.5  HGB 13.7  HCT 39.7  PLT 281   BMET  Basename 03/16/12 1039  NA 136  K 3.6  CL 99  CO2 23  GLUCOSE 99  BUN 10  CREATININE 0.85  CALCIUM 9.8    Studies/Results: Dg Lumbar Spine 2-3 Views  03/18/2012  *RADIOLOGY REPORT*  Clinical Data: L5-S1 fusion.  DG C-ARM 1-60 MIN,LUMBAR SPINE - 2-3 VIEW  Technique: Two intraoperative fluoroscopic spot views of the lower lumbar spine are provided.  Comparison:  Post-myelogram CT scan 08/12/2011.  Findings: Provided image demonstrates pedicle screws and stabilization bars with interbody spacer at L5-S1.  IMPRESSION: L5-S1 fusion.  Original Report Authenticated By: Bernadene Bell. Maricela Curet, M.D.   Dg Lumbar Spine 1 View  03/18/2012  *RADIOLOGY REPORT*  Clinical Data: 46 year old female undergoing lumbar surgery.  LUMBAR SPINE - 1 VIEW  Comparison: Lumbar CT myelogram 08/12/2011.  Findings: Portable cross-table lateral intraoperative view of the lumbar spine labeled film #2 at 0840 hours.  Surgical clamp on the L5 spinous process.  IMPRESSION: Intraoperative localization at L5.  Original Report Authenticated By: Harley Hallmark, M.D.   Dg C-arm 1-60 Min  03/18/2012  *RADIOLOGY REPORT*  Clinical Data:  L5-S1 fusion.  DG C-ARM 1-60 MIN,LUMBAR SPINE - 2-3 VIEW  Technique: Two intraoperative fluoroscopic spot views of the lower lumbar spine are provided.  Comparison:  Post-myelogram CT scan 08/12/2011.  Findings: Provided image demonstrates pedicle screws and stabilization bars with interbody spacer at L5-S1.  IMPRESSION: L5-S1 fusion.  Original Report Authenticated By: Bernadene Bell. Maricela Curet, M.D.    Assessment/Plan: Doing well. Looks quite good. Numbness secondary to nerve decompression in light of full strength. Will continue present rx. Increase activity as tolerated.  LOS: 1 day  as above   Reinaldo Meeker, MD 03/19/2012, 8:51 AM

## 2012-03-19 NOTE — Evaluation (Signed)
Physical Therapy Evaluation Patient Details Name: Toni Lewis MRN: [id number] DOB: 1966-06-17 Today's Date: 03/19/2012 Time: 4098-1191 PT Time Calculation (min): 38 min  PT Assessment / Plan / Recommendation Clinical Impression  Pt presents s/p PLF L5-S1 with decreased sensation and decreased functional ability. Pt will benefit from skillled PT in the acute care setting in order to maximize functional mobility and safety prior to d/c home with family support.    PT Assessment  Patient needs continued PT services    Follow Up Recommendations  Home health PT;Supervision for mobility/OOB       lEquipment Recommendations  Rolling walker with 5" wheels       Frequency Min 5X/week    Precautions / Restrictions Precautions Precautions: Back Precaution Booklet Issued: Yes (comment) Precaution Comments: Pt educated on 3/3 back precautions. Required Braces or Orthoses: Spinal Brace Spinal Brace: Lumbar corset;Applied in sitting position Restrictions Weight Bearing Restrictions: No         Mobility  Bed Mobility Bed Mobility: Rolling Left;Left Sidelying to Sit;Sitting - Scoot to [place] of Bed Rolling Left: 4: Min assist;With rail Left Sidelying to Sit: 4: Min assist;With rails;HOB elevated Sitting - Scoot to [place] of Bed: 4: Min guard Details for Bed Mobility Assistance: VC for proper sequencing in order to mantain back precautions throughout transfer. Assist through trunk for support into sitting. Transfers Transfers: Sit to Stand;Stand to Sit Sit to Stand: 4: Min assist;With upper extremity assist;From bed Stand to Sit: 4: Min assist;With upper extremity assist;To chair/3-in-1 Details for Transfer Assistance: VC for proper hand placement for safety to/from RW. Pt able to control descent with min assist into chair Ambulation/Gait Ambulation/Gait Assistance: 4: Min guard Ambulation Distance (Feet): 100 Feet Assistive device: Rolling walker Ambulation/Gait Assistance Details: VC  for proper sequencing and safety with RW throughout ambulation. [place] for safety as pt with RLE sensation deficits. Pt with extremely slow gait Gait Pattern: Step-to pattern;Decreased stride length;Antalgic Gait velocity: decreased gait speed    Exercises     PT Diagnosis: Acute pain;Difficulty walking  PT Problem List: Decreased strength;Impaired sensation;Pain;Decreased mobility;Decreased activity tolerance;Decreased knowledge of use of DME;Decreased safety awareness;Decreased knowledge of precautions PT Treatment Interventions: DME instruction;Gait training;Stair training;Functional mobility training;Therapeutic activities;Patient/family education   PT Goals Acute Rehab PT Goals PT Goal Formulation: With patient Time For Goal Achievement: 03/26/12 Potential to Achieve Goals: Good Pt will Roll Supine to Left Side: with modified independence PT Goal: Rolling Supine to Left Side - Progress: Goal set today Pt will go Supine/Side to Sit: with modified independence PT Goal: Supine/Side to Sit - Progress: Goal set today Pt will go Sit to Supine/Side: with modified independence PT Goal: Sit to Supine/Side - Progress: Goal set today Pt will go Sit to Stand: with modified independence PT Goal: Sit to Stand - Progress: Goal set today Pt will go Stand to Sit: with modified independence PT Goal: Stand to Sit - Progress: Goal set today Pt will Transfer Bed to Chair/Chair to Bed: with modified independence PT Transfer Goal: Bed to Chair/Chair to Bed - Progress: Goal set today Pt will Ambulate: >150 feet;with supervision;with least restrictive assistive device PT Goal: Ambulate - Progress: Goal set today Pt will Go Up / Down Stairs: 3-5 stairs;with min assist;with least restrictive assistive device PT Goal: Up/Down Stairs - Progress: Goal set today  Visit Information  Last PT Received On: 03/19/12 Assistance Needed: +1 PT/OT Co-Evaluation/Treatment: Yes    Subjective Data      Prior  Functioning  Home Living Lives With:  Family (mother) Available Help at Discharge: Family;Available 24 hours/day Type of Home: House Home Access: Stairs to enter [employer] of Steps: 3 Entrance Stairs-Rails: None (reaches for doorway for support) Home Layout: One level Bathroom Shower/Tub: Tub/shower unit;Curtain;Walk-in shower Bathroom Toilet: Standard Bathroom Accessibility: Yes How Accessible: Accessible via walker Home Adaptive Equipment: Straight cane Prior Function Level of Independence: Independent Able to Take Stairs?: Yes Driving: Yes Vocation: Workers Chief Operating Officer: No difficulties Dominant Hand: Right    Cognition  Overall Cognitive Status: Appears within functional limits for tasks assessed/performed Arousal/Alertness: Awake/alert Orientation Level: Appears intact for tasks assessed Behavior During Session: [facility] for tasks performed    Extremity/Trunk Assessment Right Lower Extremity Assessment RLE ROM/Strength/Tone: Within functional levels RLE Sensation: Deficits RLE Sensation Deficits: Pt with decreased sensation on L5 myotome, no sensation S1.  RLE Coordination: WFL - gross/fine motor Left Lower Extremity Assessment LLE ROM/Strength/Tone: Within functional levels LLE Sensation: WFL - Light Touch LLE Coordination: WFL - gross/fine motor   Balance Balance Balance Assessed: Yes Static Standing Balance Static Standing - Balance Support: Bilateral upper extremity supported Static Standing - Level of Assistance: 5: Stand by assistance Static Standing - Comment/# of Minutes: Pt standing at sink for ~ 5 minutes with SBA only for safety while brushing teeth and washing face  End of Session PT - End of Session Equipment Utilized During Treatment: Gait belt;Back brace Activity Tolerance: Patient tolerated treatment well Patient left: in chair;with call bell/phone within reach Nurse Communication: Mobility status   Milana Kidney 03/19/2012, 10:19 AM  03/19/2012 Milana Kidney DPT PAGER: [phone] OFFICE: [phone]

## 2012-03-19 NOTE — Progress Notes (Signed)
Occupational Therapy Evaluation Patient Details Name: Toni Lewis MRN: [id number] DOB: 1965/11/19 Today's Date: 03/19/2012 Time: 7829-5621 OT Time Calculation (min): 39 min  OT Assessment / Plan / Recommendation Clinical Impression  Pt s/p posterior lumbar fusion thus affecting PLOF. Will benefit from acute OT services to address below problem list in prep for d/c home with family.    OT Assessment  Patient needs continued OT Services    Follow Up Recommendations  Supervision/Assistance - 24 hour    Barriers to Discharge      Equipment Recommendations  Rolling walker with 5" wheels;3 in 1 bedside comode    Recommendations for Other Services    Frequency  Min 2X/week    Precautions / Restrictions Precautions Precautions: Back Precaution Booklet Issued: Yes (comment) Precaution Comments: Pt educated on 3/3 back precautions. Required Braces or Orthoses: Spinal Brace Spinal Brace: Lumbar corset;Applied in sitting position Restrictions Weight Bearing Restrictions: No   Pertinent Vitals/Pain See vitals    ADL  Grooming: Performed;Wash/dry face;Teeth care;Brushing hair;Supervision/safety Where Assessed - Grooming: Unsupported standing Upper Body Dressing: Performed;Set up Where Assessed - Upper Body Dressing: Unsupported sitting Lower Body Dressing: Performed;Maximal assistance Where Assessed - Lower Body Dressing: Sopported sit to stand Toilet Transfer: Simulated;Minimal assistance Toilet Transfer Method:  (ambulating) Toilet Transfer Equipment:  (chair) Equipment Used: Back brace;Gait belt;Rolling walker Transfers/Ambulation Related to ADLs: Pt ambulated with supervision and intermittent min guard during turns with RW. ADL Comments: Educated pt on donning/doffing back brace, correct technique for bed mobility, and techniques to maintain back precautions during ADLs.    OT Diagnosis: Acute pain  OT Problem List: Decreased activity tolerance;Decreased knowledge of use of  DME or AE;Decreased knowledge of precautions;Pain OT Treatment Interventions: Self-care/ADL training;DME and/or AE instruction;Therapeutic activities;Patient/family education   OT Goals Acute Rehab OT Goals OT Goal Formulation: With patient Time For Goal Achievement: 03/26/12 Potential to Achieve Goals: Good ADL Goals Pt Will Perform Lower Body Bathing: with supervision;Sit to stand from bed;Sit to stand from chair;with adaptive equipment ADL Goal: Lower Body Bathing - Progress: Goal set today Pt Will Perform Lower Body Dressing: with supervision;Sit to stand from chair;Sit to stand from bed;with adaptive equipment ADL Goal: Lower Body Dressing - Progress: Goal set today Pt Will Perform Tub/Shower Transfer: Shower transfer;Tub transfer;with supervision;with DME;Ambulation;Shower seat with back;Maintaining back safety precautions (pt has walk in shower but prefers tub) ADL Goal: Tub/Shower Transfer - Progress: Goal set today Additional ADL Goal #1: Pt will perform all components of toileting ADL with supervision. ADL Goal: Additional Goal #1 - Progress: Goal set today Miscellaneous OT Goals Miscellaneous OT Goal #1: Pt will independently verbalize and maintain back safety precautions during all ADL activity. OT Goal: Miscellaneous Goal #1 - Progress: Goal set today  Visit Information  Last OT Received On: 03/19/12 Assistance Needed: +1 PT/OT Co-Evaluation/Treatment: Yes    Subjective Data  Subjective: My right leg feels numb. Patient Stated Goal: Return to work when back heals.   Prior Functioning  Home Living Lives With: Family (mother) Available Help at Discharge: Family;Available 24 hours/day Type of Home: House Home Access: Stairs to enter [employer] of Steps: 3 Entrance Stairs-Rails: None (reaches for doorway for support) Home Layout: One level Bathroom Shower/Tub: Tub/shower unit;Curtain;Walk-in shower Bathroom Toilet: Standard Bathroom Accessibility:  Yes How Accessible: Accessible via walker Home Adaptive Equipment: Straight cane Prior Function Level of Independence: Independent Able to Take Stairs?: Yes Driving: Yes Vocation: Workers comp Comments: Pt is a Naval architect for [employer]. Communication Communication: No difficulties Dominant  Hand: Right    Cognition  Overall Cognitive Status: Appears within functional limits for tasks assessed/performed Arousal/Alertness: Awake/alert Orientation Level: Appears intact for tasks assessed Behavior During Session: [facility] for tasks performed    Extremity/Trunk Assessment Right Upper Extremity Assessment RUE ROM/Strength/Tone: Within functional levels Left Upper Extremity Assessment LUE ROM/Strength/Tone: Within functional levels Right Lower Extremity Assessment RLE ROM/Strength/Tone: Within functional levels RLE Sensation: Deficits RLE Sensation Deficits: Pt with decreased sensation on L5 myotome, no sensation S1.  RLE Coordination: WFL - gross/fine motor Left Lower Extremity Assessment LLE ROM/Strength/Tone: Within functional levels LLE Sensation: WFL - Light Touch LLE Coordination: WFL - gross/fine motor   Mobility Bed Mobility Bed Mobility: Rolling Left;Left Sidelying to Sit;Sitting - Scoot to [place] of Bed Rolling Left: 4: Min assist;With rail Left Sidelying to Sit: 4: Min assist;With rails;HOB elevated Sitting - Scoot to [place] of Bed: 4: Min guard Details for Bed Mobility Assistance: VC for proper sequencing in order to mantain back precautions throughout transfer. Assist through trunk for support into sitting. Transfers Sit to Stand: 4: Min assist;With upper extremity assist;From bed Stand to Sit: 4: Min assist;With upper extremity assist;To chair/3-in-1 Details for Transfer Assistance: VC for proper hand placement for safety to/from RW. Pt able to control descent with min assist into chair   Exercise    Balance Balance Balance Assessed: Yes Static Standing Balance Static Standing  - Balance Support: Bilateral upper extremity supported Static Standing - Level of Assistance: 5: Stand by assistance Static Standing - Comment/# of Minutes: Pt standing at sink for ~ 5 minutes with SBA only for safety while brushing teeth and washing face  End of Session OT - End of Session Equipment Utilized During Treatment: Gait belt;Back brace Activity Tolerance: Patient tolerated treatment well Patient left: in chair;with call bell/phone within reach Nurse Communication: Mobility status;Other (comment) (R leg numb)  03/19/2012 Cipriano Mile OTR/L Pager [phone] Office [phone]  Cipriano Mile 03/19/2012, 10:40 AM

## 2012-03-20 NOTE — Progress Notes (Signed)
Physical Therapy Treatment Patient Details Name: Toni Lewis MRN: [id number] DOB: [birth year] Today's Date: 03/20/2012 Time: 4098-1191 PT Time Calculation (min): 25 min  PT Assessment / Plan / Recommendation Comments on Treatment Session  Increased activity tolerance today (increased gait distance and stair education completed) with less assistance required.    Follow Up Recommendations  Home health PT;Supervision for mobility/OOB       Equipment Recommendations  3 in 1 bedside comode;Rolling walker with 5" wheels       Frequency Min 5X/week   Plan Discharge plan remains appropriate;Frequency remains appropriate    Precautions / Restrictions Precautions Precautions: Back Precaution Comments: pt able to recall 3/3 back precautions without cues. Required Braces or Orthoses: Spinal Brace Spinal Brace: Lumbar corset;Applied in sitting position Restrictions Other Position/Activity Restrictions: Pt independent in donning/doffing brace at edge of bed without cues/assistance.       Mobility  Bed Mobility Rolling Left: 6: Modified independent (Device/Increase time) Left Sidelying to Sit: 6: Modified independent (Device/Increase time);HOB flat Sitting - Scoot to Edge of Bed: 6: Modified independent (Device/Increase time) Details for Bed Mobility Assistance: no rails used with bed flat. increased time required for rolling to left side and sitting up, however no physical assistance needed. Pt demo'd safe technique with utilization of the logroll technique. Transfers Sit to Stand: 5: Supervision;From bed;With upper extremity assist;From toilet (elevated toilet with 3n1) Stand to Sit: To chair/3-in-1;To toilet;5: Supervision;With upper extremity assist;With armrests Details for Transfer Assistance: no assistance needed with transfers, cues x1 stand for hand placement. pt with controlled descent with all sitting transfers (to toilet x2 and chair x1). Ambulation/Gait Ambulation/Gait  Assistance: 5: Supervision Ambulation Distance: 200 feet Assistive device: Rolling walker Ambulation/Gait Assistance Details: cues to increase bil step lenght and increase DF with swing phase for increased foot clearance and to promote heel strike with heel-toe step progression. Gait Pattern: Step-through pattern;Decreased stride length;Decreased step length - right;Decreased step length - left;Shuffle Gait velocity: decreased intially, with above cues gait cadence increased. Stairs: Yes Stairs Assistance: 5: Supervision Stairs Assistance Details (indicate cue type and reason): cues for sequency (up with strong leg first and down with weak leg first). Pt uses wall at home to steady herself with stairs, therefore replicated wall assist with stair instuction and added family HHA on contralateral side for increased stability and safety. Stair Management Technique: Step to pattern;Forwards;Other (comment) (right side wall assist and left side HHA up, reverse down.) Number of Stairs: 3      PT Goals Acute Rehab PT Goals PT Goal: Rolling Supine to Left Side - Progress: Met PT Goal: Supine/Side to Sit - Progress: Met PT Goal: Sit to Stand - Progress: Progressing toward goal PT Goal: Stand to Sit - Progress: Progressing toward goal PT Goal: Ambulate - Progress: Progressing toward goal PT Goal: Up/Down Stairs - Progress: Met  Visit Information  Last PT Received On: 03/20/12 Assistance Needed: +1    Subjective Data  Subjective: No new complaints, reports feeling better today. Agreeable to therapy today.   Cognition  Overall Cognitive Status: Appears within functional limits for tasks assessed/performed Arousal/Alertness: Awake/alert Orientation Level: Appears intact for tasks assessed Behavior During Session: [facility] for tasks performed       End of Session PT - End of Session Equipment Utilized During Treatment: Gait belt;Back brace Activity Tolerance: Patient tolerated treatment  well Patient left: in chair;with call bell/phone within reach;with family/visitor present Nurse Communication: Mobility status    Sallyanne Kuster 03/20/2012, 1:57 PM  Sallyanne Kuster,  PTA Office- [phone]

## 2012-03-20 NOTE — Progress Notes (Signed)
Orthopedic Tech Progress Note Patient Details:  Toni Lewis August 26, 1966 [id number]  Patient ID: [name], female   DOB: 06-05-1966, 46 y.o.   MRN: [id number]   Shawnie Pons 03/20/2012, 10:49 AM LS SUPPORT W/ ANT AND POST PANELS 5/31 BIO TECH

## 2012-03-20 NOTE — Progress Notes (Signed)
Patient ID: Toni Lewis, female   DOB: 22-Mar-1966, 46 y.o.   MRN: [id number] BP 118/79  Pulse 81  Temp(Src) 98.6 F (37 C) (Oral)  Resp 16  Ht 5\' 6"  (1.676 m)  Wt 102.059 kg (225 lb)  BMI 36.32 kg/m2  SpO2 99% Alert and oriented x 4 Moving lower extremities well. 5/5 strength Wound clean, dry, no signs of infection. Continue pt

## 2012-03-20 NOTE — Progress Notes (Signed)
Occupational Therapy Treatment Patient Details Name: Toni Lewis MRN: [id number] DOB: [birth year] Today's Date: 03/20/2012 Time: 1525-1540 OT Time Calculation (min): 15 min  OT Assessment / Plan / Recommendation Comments on Treatment Session Focus of session on AE education.  Pt would benefit from continued education/reinforcement on use of toilet aid.  Pt also would benefit from both walk in shower and tub transfer education during next session. Unable to perform tub/shower transfer today due to pain.    Follow Up Recommendations  Supervision/Assistance - 24 hour    Barriers to Discharge       Equipment Recommendations  3 in 1 bedside comode;Rolling walker with 5" wheels    Recommendations for Other Services    Frequency Min 2X/week   Plan Discharge plan remains appropriate    Precautions / Restrictions Precautions Precautions: Back Precaution Comments: pt able to recall 3/3 back precautions without cues. Required Braces or Orthoses: Spinal Brace Spinal Brace: Lumbar corset;Applied in sitting position   Pertinent Vitals/Pain See vitals    ADL  Lower Body Bathing: Simulated;Supervision/safety Where Assessed - Lower Body Bathing: Unsupported sitting Lower Body Dressing: Simulated;Supervision/safety Where Assessed - Lower Body Dressing: Unsupported sitting;Supine, head of bed flat Equipment Used: Back brace;Long-handled sponge (toilet aid) ADL Comments: Educated pt on use of toilet aid, LB dressing techniques (supine in bed with knees flexed and ankles crossed over legs), and use of seat in shower.  Pt prefers to use tub in her bathroom vs the walk in shower that her mother uses.  Discussed with pt various kinds of toilet aids and where to purchase them (gift shop or medical supply store).      OT Diagnosis:    OT Problem List:   OT Treatment Interventions:     OT Goals ADL Goals Pt Will Perform Lower Body Bathing: with supervision;Sit to stand from bed;Sit to stand from  chair;with adaptive equipment ADL Goal: Lower Body Bathing - Progress: Progressing toward goals Pt Will Perform Lower Body Dressing: with supervision;Sit to stand from chair;Sit to stand from bed;with adaptive equipment ADL Goal: Lower Body Dressing - Progress: Progressing toward goals Additional ADL Goal #1: Pt will perform all components of toileting ADL with supervision. ADL Goal: Additional Goal #1 - Progress: Progressing toward goals Miscellaneous OT Goals Miscellaneous OT Goal #1: Pt will independently verbalize and maintain back safety precautions during all ADL activity. OT Goal: Miscellaneous Goal #1 - Progress: Progressing toward goals  Visit Information  Last OT Received On: 03/20/12 Assistance Needed: +1    Subjective Data      Prior Functioning       Cognition  Overall Cognitive Status: Appears within functional limits for tasks assessed/performed Arousal/Alertness: Awake/alert Orientation Level: Appears intact for tasks assessed Behavior During Session: [facility] for tasks performed    Mobility Bed Mobility Rolling Left: 6: Modified independent (Device/Increase time) Left Sidelying to Sit: 6: Modified independent (Device/Increase time);HOB flat Sitting - Scoot to Edge of Bed: 6: Modified independent (Device/Increase time) Details for Bed Mobility Assistance: pt demonstrated good technique   Exercises    Balance    End of Session OT - End of Session Equipment Utilized During Treatment: Back brace Activity Tolerance: Patient limited by pain Patient left: in bed;with call bell/phone within reach Nurse Communication: Patient requests pain meds  03/20/2012 Cipriano Mile OTR/L Pager [phone] Office [phone]  Cipriano Mile 03/20/2012, 5:33 PM

## 2012-03-21 MED ORDER — CYCLOBENZAPRINE HCL 10 MG PO TABS: 10.0000 mg | ORAL_TABLET | Freq: Two times a day (BID) | ORAL | Status: AC | PRN

## 2012-03-21 MED ORDER — PROMETHAZINE HCL 12.5 MG PO TABS: 12.5000 mg | ORAL_TABLET | Freq: Four times a day (QID) | ORAL | Status: DC | PRN

## 2012-03-21 MED ORDER — OXYCODONE-ACETAMINOPHEN 5-325 MG PO TABS: 1.0000 | ORAL_TABLET | ORAL | Status: AC | PRN

## 2012-03-21 NOTE — Care Management Note (Signed)
    Page 1 of 2   03/21/2012     2:41:04 PM   CARE MANAGEMENT NOTE 03/21/2012  Patient:  Toni Lewis, Toni Lewis   Account Number:  [id number]  Date Initiated:  03/18/2012  Documentation initiated by:  [facility]  Subjective/Objective Assessment:   Admitted postop L5 decompressive laminectomy, decompression L5-S1 nerve roots     Action/Plan:   PT eval-recommending HHPT, 3in 1, rolling walker, tub bench  OT eval-no d/c needs   Anticipated DC Date:  03/21/2012   Anticipated DC Plan:  HOME W HOME HEALTH SERVICES      DC Planning Services  CM consult      Choice offered to / List presented to:          [facility] arranged  HH-2 PT      [facility] agency  [facility]   Status of service:  Completed, signed off Medicare Important Message given?   (If response is "NO", the following Medicare IM given date fields will be blank) Date Medicare IM given:   Date Additional Medicare IM given:    Discharge Disposition:  HOME W HOME HEALTH SERVICES  Per UR Regulation:  Reviewed for med. necessity/level of care/duration of stay  If discussed at Long Length of Stay Meetings, dates discussed:    Comments:  03/21/12 Received call from Lucy at [facility] [phone] ext [phone], patient will receive HHPT from [facility] [phone] and equipment from [employer] [phone]. Rolling walker, 3 in 1 and tub bench will be delivered to patient's home.  Gave patient contact numbers for Lucy,  [facility] and [employer]. Jacquelynn Cree RN,BSN, CCM  03/21/12 Workers Comp case manager Jonne Ply [phone]. Contacted Ms Rulon Eisenmenger and was instructed to call [facility] for HHPT and equipment [phone]. Spoke with Marylu Lund, she requested orders, face sheet, H and P, and PT notes be faxed to [phone], ref # [id number]. They will set up HHPT and equipment, requested delivery to hospital room. HHC agency will contact me.  Faxed requested information . Will continue to follow. Jacquelynn Cree RN, BSN, CCM  03/17/12 Called tel # for workers comp [phone]  and left voicemail requesting a call back to determine who the adjuster is for patient in case there are HHC or equipment needs for pt. Will continue to follow. Jacquelynn Cree RN, BSN, CCM

## 2012-03-21 NOTE — Progress Notes (Signed)
Occupational Therapy Treatment Patient Details Name: Toni Lewis MRN: [id number] DOB: 12-30-1965 Today's Date: 03/21/2012 Time: 4098-1191 OT Time Calculation (min): 13 min  OT Assessment / Plan / Recommendation Comments on Treatment Session Educated pt on AE kit. Pt declined practice.    Follow Up Recommendations  Supervision/Assistance - 24 hour;No OT follow up    Barriers to Discharge       Equipment Recommendations  3 in 1 bedside comode;Rolling walker with 5" wheels;Tub/shower seat (AE kit)    Plan Discharge plan remains appropriate    Precautions / Restrictions Precautions Precautions: Back Precaution Comments: pt able to recall 3/3 back precautions without cues. Required Braces or Orthoses: Spinal Brace Spinal Brace: Lumbar corset;Applied in sitting position   Pertinent Vitals/Pain Pt with no reports of pain    ADL  Transfers/Ambulation Related to ADLs: Supervision with RW ambulation ADL Comments: Educated pt on AE kit and where to purchase. Pt declined practice of the equipment and stated that she will have assist of her mother until she can acquire kit. Pt with no questions.     OT Goals ADL Goals ADL Goal: Lower Body Bathing - Progress: Progressing toward goals ADL Goal: Lower Body Dressing - Progress: Progressing toward goals   Visit Information  Last OT Received On: 03/21/12 Assistance Needed: +1    Cognition  Overall Cognitive Status: Appears within functional limits for tasks assessed/performed Arousal/Alertness: Awake/alert Orientation Level: Appears intact for tasks assessed Behavior During Session: [facility] for tasks performed    Exercises    End of Session OT - End of Session Equipment Utilized During Treatment: Back brace Activity Tolerance: Patient tolerated treatment well Patient left: in bed;with call bell/phone within reach   Jaysha Lasure 03/21/2012, 2:50 PM

## 2012-03-21 NOTE — Progress Notes (Signed)
Occupational Therapy Treatment Patient Details Name: Toni Lewis MRN: [id number] DOB: June 05, 1966 Today's Date: 03/21/2012 Time: 4098-1191 OT Time Calculation (min): 26 min  OT Assessment / Plan / Recommendation Comments on Treatment Session Focus of session on tub/shower transfer. Next session to focus on demonstration/use of AE    Follow Up Recommendations  Supervision/Assistance - 24 hour;No OT follow up    Barriers to Discharge       Equipment Recommendations  3 in 1 bedside comode;Rolling walker with 5" wheels;Tub/shower seat    Recommendations for Other Services    Frequency     Plan Discharge plan remains appropriate    Precautions / Restrictions Precautions Precautions: Back Precaution Comments: pt able to recall 3/3 back precautions without cues. Required Braces or Orthoses: Spinal Brace Spinal Brace: Lumbar corset;Applied in sitting position   Pertinent Vitals/Pain Pt reports 5/10 back pain. Pre-medicated    ADL  Toilet Transfer: Research scientist (life sciences) Method: Sit to Barista: Materials engineer and Hygiene: Performed;Min guard Where Assessed - Engineer, mining and Hygiene: Standing Tub/Shower Transfer: Performed;Min guard Web designer Method: Ambulating (side-stepping into/out of tub) Psychologist, educational: Information systems manager with back Equipment Used: Back brace;Rolling walker Transfers/Ambulation Related to ADLs: Supervision with RW ambulation ADL Comments: Pt demonstrated ability to perform toileting hygiene without the need of AE, but able to verbalize where to purchase this equipement if she finds she needs it. Pt able to perform tub/shower transfer with min guard A including get into and out of tub and sit to stand from seat inside of tub. Pt interested in AE kit- will educate pt on these items next session    OT Diagnosis:    OT Problem List:   OT Treatment  Interventions:     OT Goals ADL Goals ADL Goal: Lower Body Bathing - Progress: Progressing toward goals ADL Goal: Lower Body Dressing - Progress: Progressing toward goals ADL Goal: Tub/Shower Transfer - Progress: Progressing toward goals ADL Goal: Additional Goal #1 - Progress: Met Miscellaneous OT Goals OT Goal: Miscellaneous Goal #1 - Progress: Met  Visit Information  Last OT Received On: 03/21/12 Assistance Needed: +1    Subjective Data      Prior Functioning       Cognition  Overall Cognitive Status: Appears within functional limits for tasks assessed/performed Arousal/Alertness: Awake/alert Orientation Level: Appears intact for tasks assessed Behavior During Session: [facility] for tasks performed    Mobility Bed Mobility Bed Mobility: Not assessed Transfers Sit to Stand: 5: Supervision;From bed;From toilet Stand to Sit: 5: Supervision;To bed Details for Transfer Assistance: able to perform sit to stand without any cues. supervision for safety   Exercises    Balance    End of Session OT - End of Session Equipment Utilized During Treatment: Back brace Activity Tolerance: Patient tolerated treatment well Patient left: in bed;with call bell/phone within reach   Toni Lewis 03/21/2012, 11:23 AM

## 2012-03-21 NOTE — Discharge Summary (Signed)
Physician Discharge Summary  Patient ID: Toni Lewis MRN: [id number] DOB/AGE: [birth year] 46 y.o.  Admit date: 03/18/2012 Discharge date: 03/21/2012  Admission Diagnoses: Lumbar spondylosis and stenosis L5-S1 with radiculopathy  Discharge Diagnoses: Lumbar spondylosis and stenosis L5-S1 with radiculopathy Active Problems:  * No active hospital problems. *    Discharged Condition: good  Hospital Course: Patient was admitted to undergo surgical decompression and stabilization at L5-S1. She tolerated surgery well. She is ambulatory and on oral pain medications. She does report some difficulty with nausea secondary to pain medication.  Consults: None  Significant Diagnostic Studies: Myelogram and post milligrams CAT scan  Treatments: surgery: Vertical decompression of L5-S1 via laminectomy posterior interbody arthrodesis with peek spacers local autograft and allograft segmental fixation L5-S1 with pedicle screws.  Discharge Exam: Blood pressure 129/78, pulse 88, temperature 98.5 F (36.9 C), temperature source Oral, resp. rate 18, height 5\' 6"  (1.676 m), weight 102.059 kg (225 lb), SpO2 100.00%. Patient is alert and ambulatory motor function is intact in iliopsoas quadricep tibialis anterior and gastrocs station and gait are intact incision is clean and dry  Disposition:  home  Discharge Orders    Future Orders Please Complete By Expires   Diet - low sodium heart healthy      Increase activity slowly      Discharge instructions      Comments:   Okay to shower. Do not apply salves or appointments to incision. No heavy lifting with the upper extremities greater than 15 pounds. May resume driving when not requiring pain medication and patient feels comfortable with doing so.   Call MD for:  redness, tenderness, or signs of infection (pain, swelling, redness, odor or green/yellow discharge around incision site)      Call MD for:  severe uncontrolled pain      Call MD for:  temperature  >100.4        Medication List  As of 03/21/2012 10:10 AM   TAKE these medications         cyclobenzaprine 10 MG tablet   Commonly known as: FLEXERIL   Take 10 mg by mouth 2 (two) times daily as needed. For muscle relaxer      cyclobenzaprine 10 MG tablet   Commonly known as: FLEXERIL   Take 1 tablet (10 mg total) by mouth 2 (two) times daily as needed for muscle spasms.      losartan-hydrochlorothiazide 50-12.5 MG per tablet   Commonly known as: HYZAAR   Take 1 tablet by mouth daily.      mulitivitamin with minerals Tabs   Take 1 tablet by mouth daily.      oxyCODONE-acetaminophen 5-325 MG per tablet   Commonly known as: PERCOCET   Take 1-2 tablets by mouth every 4 (four) hours as needed for pain.      promethazine 12.5 MG tablet   Commonly known as: PHENERGAN   Take 1 tablet (12.5 mg total) by mouth every 6 (six) hours as needed for nausea.             SignedStefani Dama 03/21/2012, 10:10 AM

## 2012-03-22 MED FILL — Sodium Chloride Irrigation Soln 0.9%: Qty: 1000 | Status: AC

## 2012-03-22 MED FILL — Heparin Sodium (Porcine) Inj 1000 Unit/ML: INTRAMUSCULAR | Qty: 30 | Status: AC

## 2012-03-22 MED FILL — Sodium Chloride IV Soln 0.9%: INTRAVENOUS | Qty: 1000 | Status: AC

## 2013-09-14 ENCOUNTER — Emergency Department (HOSPITAL_COMMUNITY)
Admission: EM | Admit: 2013-09-14 | Discharge: 2013-09-14 | Disposition: A | Discharge status: Home / Self Care | Payer: BC Managed Care – PPO | Attending: Emergency Medicine | Admitting: Emergency Medicine

## 2013-09-14 ENCOUNTER — Encounter (HOSPITAL_COMMUNITY): Payer: Self-pay | Admitting: Emergency Medicine

## 2013-09-14 ENCOUNTER — Emergency Department (HOSPITAL_COMMUNITY): Payer: BC Managed Care – PPO

## 2013-09-14 DIAGNOSIS — M791 Myalgia, unspecified site: Secondary | ICD-10-CM

## 2013-09-14 DIAGNOSIS — Z8679 Personal history of other diseases of the circulatory system: Secondary | ICD-10-CM | POA: Insufficient documentation

## 2013-09-14 DIAGNOSIS — R55 Syncope and collapse: Secondary | ICD-10-CM

## 2013-09-14 DIAGNOSIS — R197 Diarrhea, unspecified: Secondary | ICD-10-CM | POA: Insufficient documentation

## 2013-09-14 DIAGNOSIS — I10 Essential (primary) hypertension: Secondary | ICD-10-CM | POA: Insufficient documentation

## 2013-09-14 DIAGNOSIS — R112 Nausea with vomiting, unspecified: Secondary | ICD-10-CM | POA: Insufficient documentation

## 2013-09-14 DIAGNOSIS — R42 Dizziness and giddiness: Secondary | ICD-10-CM | POA: Insufficient documentation

## 2013-09-14 DIAGNOSIS — M542 Cervicalgia: Secondary | ICD-10-CM | POA: Insufficient documentation

## 2013-09-14 DIAGNOSIS — R61 Generalized hyperhidrosis: Secondary | ICD-10-CM | POA: Insufficient documentation

## 2013-09-14 DIAGNOSIS — Z8711 Personal history of peptic ulcer disease: Secondary | ICD-10-CM | POA: Insufficient documentation

## 2013-09-14 DIAGNOSIS — Z8742 Personal history of other diseases of the female genital tract: Secondary | ICD-10-CM | POA: Insufficient documentation

## 2013-09-14 DIAGNOSIS — M79609 Pain in unspecified limb: Secondary | ICD-10-CM | POA: Insufficient documentation

## 2013-09-14 DIAGNOSIS — Z79899 Other long term (current) drug therapy: Secondary | ICD-10-CM | POA: Insufficient documentation

## 2013-09-14 LAB — CBC WITH DIFFERENTIAL/PLATELET
Eosinophils Relative: 1 % (ref 0–5)
Lymphocytes Relative: 39 % (ref 12–46)
Lymphs Abs: 1.9 10*3/uL (ref 0.7–4.0)
MCV: 83.6 fL (ref 78.0–100.0)
Neutrophils Relative %: 53 % (ref 43–77)
Platelets: 267 10*3/uL (ref 150–400)
RBC: 4.33 MIL/uL (ref 3.87–5.11)
WBC: 5 10*3/uL (ref 4.0–10.5)

## 2013-09-14 LAB — COMPREHENSIVE METABOLIC PANEL
ALT: 17 U/L (ref 0–35)
Alkaline Phosphatase: 41 U/L (ref 39–117)
CO2: 27 mEq/L (ref 19–32)
Chloride: 103 mEq/L (ref 96–112)
GFR calc Af Amer: >90 mL/min (ref >90)
GFR calc non Af Amer: 87 mL/min — ABNORMAL LOW (ref >90)
Glucose, Bld: 130 mg/dL — ABNORMAL HIGH (ref 70–99)
Potassium: 3.8 mEq/L (ref 3.5–5.1)
Sodium: 138 mEq/L (ref 135–145)
Total Bilirubin: 0.3 mg/dL (ref 0.3–1.2)

## 2013-09-14 LAB — TROPONIN I
Troponin I: <0.3 ng/mL (ref <0.30)
Troponin I: <0.3 ng/mL (ref <0.30)

## 2013-09-14 MED ORDER — METHOCARBAMOL 500 MG PO TABS: 500.0000 mg | ORAL_TABLET | Freq: Two times a day (BID) | ORAL | Status: DC

## 2013-09-14 NOTE — ED Provider Notes (Signed)
CSN: [id number]     Arrival date & time 09/14/13  2536 History   First MD Initiated Contact with Patient 09/14/13 [phone]     Chief Complaint  Patient presents with  . Chest Pain   HPI  47 year old female via EMS.  She works driving a truck. She went to work at 4:30 this afternoon. She had a normal shift. She started noticing some discomfort in the posterior lateral neck and the superior aspect of her shoulder medially. Somewhat in the front of her neck. No shortness of breath no diaphoresis or other symptoms with this. She got home about 2:30 AM. She was laying in bed. She started getting a cramp in her right leg. Above her knee. She felt like she needed to stretch it.  She got up to walk, and the pain became worse attempting to walk.  She felt dizzy lightheaded, then felt a sudden nausea. She describes what sound like a vagal reaction. She went to the bathroom. Having difficulty walking on the cramping leg. In the bathroom had diarrhea, and nausea, with one episode of vomiting.. Felt lightheaded and dizzy and laid down. Her mother called paramedics. On arrival her pressure was 60 her pulse was 40. This recovered with only 200 cc of IV fluid en route.  Past Medical History  Diagnosis Date  . Endometriosis   . Peptic ulcer disease   . Anal fissure   . Hemorrhoids   . Hypertension    Past Surgical History  Procedure Laterality Date  . Tonsillectomy    . Endometrial ablation     History reviewed. No pertinent family history. History  Substance Use Topics  . Smoking status: Never Smoker   . Smokeless tobacco: Never Used  . Alcohol Use: Yes     Comment: occ--twice a mnth    OB History   Grav Para Term Preterm Abortions TAB SAB Ect Mult Living                 Review of Systems  Constitutional: Positive for diaphoresis. Negative for fever, chills, appetite change and fatigue.  HENT: Negative for mouth sores, sore throat and trouble swallowing.   Eyes: Negative for visual disturbance.   Respiratory: Negative for cough, chest tightness, shortness of breath and wheezing.   Cardiovascular: Positive for chest pain.  Gastrointestinal: Positive for nausea, vomiting and diarrhea. Negative for abdominal pain and abdominal distention.  Endocrine: Negative for polydipsia, polyphagia and polyuria.  Genitourinary: Negative for dysuria, frequency and hematuria.  Musculoskeletal: Positive for myalgias and neck pain. Negative for gait problem.  Skin: Negative for color change, pallor and rash.  Neurological: Negative for dizziness, syncope, light-headedness and headaches.  Hematological: Does not bruise/bleed easily.  Psychiatric/Behavioral: Negative for behavioral problems and confusion.    Allergies  Vicodin  Home Medications   Current Outpatient Rx  Name  Route  Sig  Dispense  Refill  . losartan-hydrochlorothiazide (HYZAAR) 50-12.5 MG per tablet   Oral   Take 1 tablet by mouth once.          . methocarbamol (ROBAXIN) 500 MG tablet   Oral   Take 1 tablet (500 mg total) by mouth 2 (two) times daily.   20 tablet   0   . EXPIRED: promethazine (PHENERGAN) 12.5 MG tablet   Oral   Take 1 tablet (12.5 mg total) by mouth every 6 (six) hours as needed for nausea.   30 tablet   3    BP 128/79  Pulse 74  Temp(Src)  97.7 F (36.5 C) (Oral)  Resp 22  SpO2 100%  LMP 08/07/2013 Physical Exam  Constitutional: She is oriented to person, place, and time. She appears well-developed and well-nourished. No distress.  HENT:  Head: Normocephalic.  Eyes: Conjunctivae are normal. Pupils are equal, round, and reactive to light. No scleral icterus.  Neck: Normal range of motion. Neck supple. No thyromegaly present.  Cardiovascular: Normal rate and regular rhythm.  Exam reveals no gallop and no friction rub.   No murmur heard. Pulmonary/Chest: Effort normal and breath sounds normal. No respiratory distress. She has no wheezes. She has no rales.  Abdominal: Soft. Bowel sounds are  normal. She exhibits no distension. There is no tenderness. There is no rebound.  Musculoskeletal: Normal range of motion.  Tender medial distal quadriceps. No pain to palpation of the calf. No asymmetry of the calves. Good perfusion of the extremities.  Neurological: She is alert and oriented to person, place, and time.  Skin: Skin is warm and dry. No rash noted.  Psychiatric: She has a normal mood and affect. Her behavior is normal.    ED Course  Procedures (including critical care time) Labs Review Labs Reviewed  COMPREHENSIVE METABOLIC PANEL - Abnormal; Notable for the following:    Glucose, Bld 130 (*)    GFR calc non Af Amer 87 (*)    All other components within normal limits  CBC WITH DIFFERENTIAL  MAGNESIUM  TROPONIN I  TROPONIN I   Imaging Review Dg Chest 2 View  09/14/2013   CLINICAL DATA:  Chest pain with cough and congestion. Lightheadedness. History hypertension.  EXAM: CHEST  2 VIEW  COMPARISON:  03/16/2012.  FINDINGS: The heart size and mediastinal contours are normal. The lungs are clear. There is no pleural effusion or pneumothorax. No acute osseous findings are identified. Degenerative changes are noted throughout the spine.  IMPRESSION: Stable chest.  No active cardiopulmonary process.   Electronically Signed   By: Roxy Horseman M.D.   On: 09/14/2013 07:46    EKG Interpretation    Date/Time:  Thursday September 14 2013 06:58:56 EST Ventricular Rate:  57 PR Interval:  193 QRS Duration: 94 QT Interval:  459 QTC Calculation: 447 R Axis:   38 Text Interpretation:  Sinus rhythm Confirmed by Fayrene Fearing  MD, Sheli Dorin ([id number]) on 09/14/2013 8:58:03 AM            MDM   1. Vagal reaction   2. Muscle pain    Her suicidal enzymes are normal. I think this is most Castillo pain in her neck. Clonus the skull or leg. Leg pain cramping turgor vagal reaction. Plan home rest hydrated prescription for Robaxin recheck with any recurrence of symptoms.    Roney Marion,  MD 09/14/13 [phone]

## 2013-09-14 NOTE — ED Notes (Signed)
The pt arrived by gems from home.  She just came home from work 0300am.  She started having lt upper chest pain with lt neck and jaw radiation while at work earlier.  Her chest pain started again at home and she had had bi-lateral leg cramps.  ems arrived and her bp was in the 60s and her heart rate was in the 40s.  Iv started and 300 ml nss bolus given.  bp came up in the ambulance to 112/70.  Hr 70.  On arrival pt alert skin warm and dry.  She had not taken her bp meds for several days until she saw her doctor on Monday who told her to stop taking the bp med.  But she only had 2 tabs left and she  Has taken the meds.  Tuesday and  Wednesday at 1000.Marland Kitchen  No pain in her chest at present she has a cramp in her rt leg

## 2013-11-16 ENCOUNTER — Other Ambulatory Visit (HOSPITAL_COMMUNITY): Payer: Self-pay | Admitting: Neurological Surgery

## 2013-11-16 ENCOUNTER — Other Ambulatory Visit: Payer: Self-pay | Admitting: Neurological Surgery

## 2013-11-16 DIAGNOSIS — M5416 Radiculopathy, lumbar region: Secondary | ICD-10-CM

## 2013-11-24 ENCOUNTER — Ambulatory Visit (HOSPITAL_COMMUNITY): Payer: BC Managed Care – PPO

## 2013-11-24 ENCOUNTER — Ambulatory Visit (HOSPITAL_COMMUNITY): Admission: RE | Admit: 2013-11-24 | Payer: BC Managed Care – PPO | Source: Ambulatory Visit

## 2014-02-01 ENCOUNTER — Ambulatory Visit (INDEPENDENT_AMBULATORY_CARE_PROVIDER_SITE_OTHER): Payer: BC Managed Care – PPO | Admitting: Cardiology

## 2014-02-01 ENCOUNTER — Encounter: Payer: Self-pay | Admitting: Cardiology

## 2014-02-01 ENCOUNTER — Encounter: Payer: Self-pay | Admitting: *Deleted

## 2014-02-01 VITALS — BP 120/110 | HR 74 | Ht 66.0 in | Wt 194.8 lb

## 2014-02-01 DIAGNOSIS — R079 Chest pain, unspecified: Secondary | ICD-10-CM

## 2014-02-01 DIAGNOSIS — I10 Essential (primary) hypertension: Secondary | ICD-10-CM

## 2014-02-01 DIAGNOSIS — R002 Palpitations: Secondary | ICD-10-CM

## 2014-02-01 DIAGNOSIS — R55 Syncope and collapse: Secondary | ICD-10-CM | POA: Insufficient documentation

## 2014-02-01 MED ORDER — METOPROLOL SUCCINATE ER 25 MG PO TB24: 25.0000 mg | ORAL_TABLET | Freq: Every day | ORAL | Status: DC

## 2014-02-01 NOTE — Patient Instructions (Addendum)
Your physician has requested that you have an echocardiogram. Echocardiography is a painless test that uses sound waves to create images of your heart. It provides your doctor with information about the size and shape of your heart and how well your heart's chambers and valves are working. This procedure takes approximately one hour. There are no restrictions for this procedure.  Your physician has recommended that you wear an event monitor. Event monitors are medical devices that record the heart's electrical activity. Doctors most often us these monitors to diagnose arrhythmias. Arrhythmias are problems with the speed or rhythm of the heartbeat. The monitor is a small, portable device. You can wear one while you do your normal daily activities. This is usually used to diagnose what is causing palpitations/syncope (passing out).  Your physician has requested that you have en exercise stress myoview. For further information please visit [email or web address]. Please follow instruction sheet, as given.   Your physician has recommended you make the following change in your medication: Toprol 25 mg daily  Nurse Visit/BP check/heart rate check in 1 week 02/08/14 at 11:15 am    Your physician recommends that you schedule a follow-up appointment in: 4 weeks with Dr. Mayford Knifeurner  Your physician recommends that you continue on your current medications as directed. Please refer to the Current Medication list given to you today.

## 2014-02-01 NOTE — Progress Notes (Signed)
[address] [place], [place]  [address] Phone: [phone] Fax:  [phone]  Date:  02/01/2014   ID:  Toni PrimasLisa Y Lewis, DOB 06/08/1966, MRN [id number]  PCP:  Maryelizabeth RowanEWEY,ELIZABETH, MD  Cardiologist:  Armanda Magicraci Melainie Krinsky, MD  E   History of Present Illness: Toni PrimasLisa Y Lewis is a 48 y.o. female with a history of HTN and PUD who presents today for evaluation of chest pain. She says the chest pain started last November.  It has been episodic.  The last the episode she had was March.  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  The pain was much worse with deep breathing and movement.  She came home and took a shower and laid in bed and the she developed a sharp pain in her right leg and then feels like she needs to vomit and then feels like she needs to have a bowel movement.  Then she gets confused and lightheaded and diaphoretic and then passes out.  This was witnessed by her mom and kept coming alert but would pass out again and EMS was called.  On arrival her systolic pressure was 60 and HR was 40.  She recovered after a 200cc IV saline bolus.  She had another episode in March when she got chest pain while in bed and it awakened her.  She then developed severe sharp pains in her legs and got out of bed and then started feeling nauseated and went to vomit but felt dizzy and sat down. She did not pass out this time.  She also states that she has had palpitations with both episodes but does not think they started first.  She could feel it up in her throat.  She describes a fluttering sensation as well.  She occasionally has some LE edema.  She denies any SOB.   Wt Readings from Last 3 Encounters:  02/01/14 194 lb 12.8 oz (88.361 kg)  03/18/12 225 lb (102.059 kg)  03/18/12 225 lb (102.059 kg)     Past Medical History  Diagnosis Date  .  Endometriosis   . Peptic ulcer disease   . Anal fissure   . Hemorrhoids   . Hypertension     No current outpatient prescriptions on file.   Current Facility-Administered Medications  Medication Dose Route Frequency Provider Last Rate Last Dose  . 0.9 %  sodium chloride infusion  500 mL Intravenous Continuous Rachael Feeaniel P Jacobs, MD        Allergies:    Allergies  Allergen Reactions  . Vicodin [Hydrocodone-Acetaminophen]     Nausea and vomiting    Social History:  The patient  reports that she has never smoked. She has never used smokeless tobacco. She reports that she drinks alcohol. She reports that she does not use illicit drugs.   Family History:  The patient's family history includes Heart attack in her mother; Heart disease in her mother; Heart failure in her mother.   ROS:  Please see the history of present illness.      All other systems reviewed and negative.   PHYSICAL EXAM: VS:  BP 120/110  Pulse 74  Ht 5\' 6"  (1.676 m)  Wt 194 lb 12.8 oz (88.361 kg)  BMI 31.46 kg/m2  SpO2 99% Well nourished, well developed, in no acute distress HEENT: normal Neck: no JVD  Cardiac:  normal S1, S2; RRR; no murmur Lungs:  clear to auscultation bilaterally, no wheezing, rhonchi or rales Abd: soft, nontender, no hepatomegaly Ext: no edema Skin: warm and dry Neuro:  CNs 2-12 intact, no focal abnormalities noted  EKG:     NSR at 71bpm with normal intervals  ASSESSMENT AND PLAN:  1. Chest pain that sounds atypical.  EKG is nonischemic.  CRF include HTN and family history of ASCAD in her mom at an early age 67(56yo). - Stress myoview 2. Palpitations - the palpitations do not seem to be the primary trigger of her syncopal events - Event monitor to assess for arrhythmias 3. Syncope that sounds vasovagal triggered by severe leg pain and chest pain.  I will do cardiac workup with stress test, echo and heart monitor and if normal then consider Tilt Table test.  Orthostatic BPs were done  during OV.  When lying supine her BP was 176/1905mmHg with HR 72bpm and she felt sleepy and boy felt light.  Sitting her BP was 177/15207mmHg with HR 62bpm feeling the same way.  Standing after 5 minutes her BP was 160/15408mmHg with HR 81bpm.  She then took 4 steps and felt very lightheaded and body felt very light and her BP was 155/12715mmHg and HR 81bpm.  I think this is most likely vasovagal syncope.  Her BP drops some with sitting and standing but not in range to cause dizziness.  Since her BP is elevated I am going to start her on Toprol 25mg  daily which is also used for patient's with vasovagal syncope.   - 2D echo to assess LVF  Followup with nurse in 1 week for BP and HR check Followup with me in 4 weeks  Signed, Armanda Magicraci Dorla Guizar, MD 02/01/2014 3:02 PM

## 2014-02-08 ENCOUNTER — Ambulatory Visit: Payer: BC Managed Care – PPO

## 2014-02-08 VITALS — BP 150/98 | HR 76 | Ht 66.0 in | Wt 193.2 lb

## 2014-02-08 DIAGNOSIS — I10 Essential (primary) hypertension: Secondary | ICD-10-CM

## 2014-02-08 NOTE — Progress Notes (Signed)
1.) Reason for visit: B/P check  2.) Name of MD requesting visit: Dr.Turner  3.) H&P: Patient stated she just started Metoprolol Succ 25mg  daily yesterday 02/07/14.  4.) ROS related to problem: B/P elevated 150/98   5.) Assessment and plan per MD: Advised to continue metoprolol succ 25 mg daily.Repeat B/P check in 1 week.  6.) Provider sign-of(MD statement): Reviewed and agree with above assessment and plan

## 2014-02-08 NOTE — Patient Instructions (Signed)
Advised to continue Metoprolol Succ 25 mg daily   Repeat Blood Pressure Check 02/08/14 at 11:15 am.

## 2014-02-16 ENCOUNTER — Ambulatory Visit (INDEPENDENT_AMBULATORY_CARE_PROVIDER_SITE_OTHER): Payer: BC Managed Care – PPO | Admitting: Nurse Practitioner

## 2014-02-16 VITALS — BP 138/88 | HR 84 | Resp 16

## 2014-02-16 DIAGNOSIS — I10 Essential (primary) hypertension: Secondary | ICD-10-CM

## 2014-02-16 NOTE — Patient Instructions (Addendum)
Your physician recommends that you keep your upcoming appointments for echocardiogram, cardiac monitor and follow-up with Dr. Mayford Knifeurner

## 2014-02-16 NOTE — Progress Notes (Signed)
1.) Reason for visit: elevated blood pressure  2.) Name of MD requesting visit: Dr. Mayford Knifeurner  3.) H&P: Patient presents for blood pressure recheck after starting Metoprolol 1 week ago  4.) ROS related to problem: Patient denies complaints; states she is feeling well and taking medication as directed  BP right arm 138/90, HR 84  BP left arm 138/88  5.) Assessment and plan per MD: follow-up as directed; our office will call you with any changes to treatment plan  6.) Provider sign-of(MD statement):  BP fairly well controlled.  Continue current medical therapy  Armanda Magicraci Turner, MD

## 2014-02-20 ENCOUNTER — Telehealth: Payer: Self-pay | Admitting: Cardiology

## 2014-02-20 ENCOUNTER — Ambulatory Visit (HOSPITAL_BASED_OUTPATIENT_CLINIC_OR_DEPARTMENT_OTHER): Payer: BC Managed Care – PPO | Admitting: Radiology

## 2014-02-20 ENCOUNTER — Ambulatory Visit (HOSPITAL_COMMUNITY): Payer: BC Managed Care – PPO | Attending: Cardiology | Admitting: Radiology

## 2014-02-20 ENCOUNTER — Encounter: Payer: Self-pay | Admitting: *Deleted

## 2014-02-20 ENCOUNTER — Encounter (INDEPENDENT_AMBULATORY_CARE_PROVIDER_SITE_OTHER): Payer: BC Managed Care – PPO

## 2014-02-20 VITALS — BP 138/94 | Ht 66.0 in | Wt 192.0 lb

## 2014-02-20 DIAGNOSIS — R079 Chest pain, unspecified: Secondary | ICD-10-CM

## 2014-02-20 DIAGNOSIS — R55 Syncope and collapse: Secondary | ICD-10-CM

## 2014-02-20 DIAGNOSIS — R002 Palpitations: Secondary | ICD-10-CM | POA: Insufficient documentation

## 2014-02-20 DIAGNOSIS — R42 Dizziness and giddiness: Secondary | ICD-10-CM | POA: Insufficient documentation

## 2014-02-20 MED ORDER — TECHNETIUM TC 99M SESTAMIBI GENERIC - CARDIOLITE
10.0000 | Freq: Once | INTRAVENOUS | Status: AC | PRN
  Administered 2014-02-20: 10 via INTRAVENOUS

## 2014-02-20 MED ORDER — TECHNETIUM TC 99M SESTAMIBI GENERIC - CARDIOLITE
30.0000 | Freq: Once | INTRAVENOUS | Status: AC | PRN
  Administered 2014-02-20: 30 via INTRAVENOUS

## 2014-02-20 NOTE — Progress Notes (Signed)
[facility] SITE 3 NUCLEAR MED [address] [place], [place] [address] [phone]    Cardiology Nuclear Med Study  Toni Lewis is a 48 y.o. female     MRN : [id number]     DOB: 09/28/1966  Procedure Date: 02/20/2014  Nuclear Med Background Indication for Stress Test:  Evaluation for Ischemia History:No Known History of CAD; Echo 02/20/14 (pending) Cardiac Risk Factors: Family History - CAD and Hypertension  Symptoms:  Chest Pain, Dizziness, Palpitations and Syncope   Nuclear Pre-Procedure Caffeine/Decaff Intake:  None > 12 hrs NPO After: 11:30pm   Lungs:  clear O2 Sat: 98% on room air. IV 0.9% NS with Angio Cath:  22g  IV Site: R Antecubital x 1, tolerated well IV Started by:  Irean HongPatsy Edwards, RN  Chest Size (in):  36 Cup Size: DDD  Height: 5\' 6"  (1.676 m)  Weight:  192 lb (87.091 kg)  BMI:  Body mass index is 31 kg/(m^2). Tech Comments:  Held Toprol x 24 hrs    Nuclear Med Study 1 or 2 day study: 1 day  Stress Test Type:  Stress  Reading MD: N/A  Order Authorizing Provider:  Armanda Magicraci Turner, MD  Resting Radionuclide: Technetium 7947m Sestamibi  Resting Radionuclide Dose: 11.0 mCi   Stress Radionuclide:  Technetium 1347m Sestamibi  Stress Radionuclide Dose: 33.0 mCi           Stress Protocol Rest HR: 58 Stress HR: 162  Rest BP: 138/94 Stress BP: 178/89  Exercise Time (min): 9:21 METS: 10.60   Predicted Max HR: 174 bpm % Max HR: 75.86 bpm Rate Pressure Product: [id number]   Dose of Adenosine (mg):  n/a Dose of Lexiscan: n/a mg  Dose of Atropine (mg): n/a Dose of Dobutamine: n/a mcg/kg/min (at max HR)  Stress Test Technologist: Frederick Peerseresa Johnson, EMT-P  Nuclear Technologist:  Domenic PoliteStephen Carbone, CNMT     Rest Procedure:  Myocardial perfusion imaging was performed at rest 45 minutes following the intravenous administration of Technetium 5247m Sestamibi. Rest ECG: NSR with non-specific ST-T wave changes  Stress Procedure:  The patient exercised on the treadmill utilizing the  Bruce Protocol for 9:21 minutes. The patient stopped due to fatigue/sob and denied any chest pain.  Technetium 5747m Sestamibi was injected at peak exercise and myocardial perfusion imaging was performed after a brief delay. Stress ECG: No significant ST segment change suggestive of ischemia.  QPS Raw Data Images:  Normal; no motion artifact; normal heart/lung ratio. Stress Images:  Normal homogeneous uptake in all areas of the myocardium. Rest Images:  Normal homogeneous uptake in all areas of the myocardium. Subtraction (SDS):  No evidence of ischemia. Transient Ischemic Dilatation (Normal <1.22):  0.97 Lung/Heart Ratio (Normal <0.45):  0.27  Quantitative Gated Spect Images QGS EDV:  106 ml QGS ESV:  40 ml  Impression Exercise Capacity:  Good exercise capacity. BP Response:  Normal blood pressure response. Clinical Symptoms:  No chest pain. ECG Impression:  No significant ST segment change suggestive of ischemia. Comparison with Prior Nuclear Study: No previous nuclear study performed  Overall Impression:  Normal stress nuclear study.  LV Ejection Fraction: 62%.  LV Wall Motion:  Normal Wall Motion   Toni Clementhomas Malaysia Crance MD

## 2014-02-20 NOTE — Progress Notes (Signed)
Echocardiogram performed.  

## 2014-02-20 NOTE — Telephone Encounter (Signed)
New Message:  Pt states she missed a call and believes it is Duwayne HeckDanielle calling about her tests. Pt is requesting a call back

## 2014-02-20 NOTE — Telephone Encounter (Signed)
Called pt back and gave results

## 2014-02-20 NOTE — Progress Notes (Signed)
Patient ID: Toni Lewis, female   DOB: 12/06/1965, 48 y.o.   MRN: [id number] [employer] 30 day cardiac event monitor applied to patient.

## 2014-03-01 ENCOUNTER — Encounter: Payer: Self-pay | Admitting: Cardiology

## 2014-03-01 ENCOUNTER — Ambulatory Visit (INDEPENDENT_AMBULATORY_CARE_PROVIDER_SITE_OTHER): Payer: BC Managed Care – PPO | Admitting: Cardiology

## 2014-03-01 VITALS — BP 126/64 | HR 97 | Ht 66.0 in | Wt 197.0 lb

## 2014-03-01 DIAGNOSIS — R55 Syncope and collapse: Secondary | ICD-10-CM

## 2014-03-01 DIAGNOSIS — R002 Palpitations: Secondary | ICD-10-CM

## 2014-03-01 DIAGNOSIS — R079 Chest pain, unspecified: Secondary | ICD-10-CM

## 2014-03-01 DIAGNOSIS — I10 Essential (primary) hypertension: Secondary | ICD-10-CM

## 2014-03-01 NOTE — Progress Notes (Signed)
[address] [place], [place]  [address] Phone: [phone] Fax:  [phone]  Date:  03/01/2014   ID:  Toni PrimasLisa Y Laidler, DOB 03/21/1966, MRN [id number]  PCP:  Maryelizabeth RowanEWEY,ELIZABETH, MD  Cardiologist:  Armanda Magicraci Rainn Zupko, MD     History of Present Illness: Toni Lewis is a 48 y.o. female with a history of HTN and PUD who presented recently for evaluation of chest pain. She says the chest pain started last November. It has been episodic. The last the episode she had was March. 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 The pain was much worse with deep breathing and movement. She came home and took a shower and laid in bed and the she developed a sharp pain in her right leg and then felt like she needed to vomit and then felt like she needed to have a bowel movement. Then she got confused and lightheaded and diaphoretic and then passed out. This was witnessed by her mom and kept coming alert but would pass out again and EMS was called. On arrival her systolic pressure was 60 and HR was 40. She recovered after a 200cc IV saline bolus. She had another episode in March when she got chest pain while in bed and it awakened her. She then developed severe sharp pains in her legs and got out of bed and then started feeling nauseated and went to vomit but felt dizzy and sat down. She did not pass out this time. She also states that she has had palpitations with both episodes but does not think they started first. She could feel it up in her throat. She described a fluttering sensation as well. She occasionally has some LE edema. She denies any SOB. She underwent stress myoview that showed no ischemia and normal LVF.  It was felt that she had vasovagal syncope and was started on Toprol XL 25mg  daily.  2D echo showed normal LVF.  She presents back today for  followup.  She still occasionally has some chest discomfort but only with the palpitations.  Heart monitor thus far show no arrhythmias.  She has not had any further episodes where she get dizzy and passes out.     Wt Readings from Last 3 Encounters:  03/01/14 197 lb (89.359 kg)  02/20/14 192 lb (87.091 kg)  02/08/14 193 lb 4 oz (87.658 kg)     Past Medical History  Diagnosis Date  . Endometriosis   . Peptic ulcer disease   . Anal fissure   . Hemorrhoids   . Hypertension     Current Outpatient Prescriptions  Medication Sig Dispense Refill  . metoprolol succinate (TOPROL XL) 25 MG 24 hr tablet Take 1 tablet (25 mg total) by mouth daily.  30 tablet  6   Current Facility-Administered Medications  Medication Dose Route Frequency Provider Last Rate Last Dose  . 0.9 %  sodium chloride infusion  500 mL Intravenous Continuous Rachael Feeaniel P Jacobs, MD        Allergies:    Allergies  Allergen Reactions  . Vicodin [Hydrocodone-Acetaminophen]     Nausea and vomiting    Social History:  The patient  reports that she has never smoked. She has never used smokeless tobacco. She reports that she drinks alcohol. She reports that she does not use illicit drugs.  Family History:  The patient's family history includes Heart attack in her mother; Heart disease in her mother; Heart failure in her mother.   ROS:  Please see the history of present illness.      All other systems reviewed and negative.   PHYSICAL EXAM: VS:  BP 126/64  Pulse 97  Ht 5\' 6"  (1.676 m)  Wt 197 lb (89.359 kg)  BMI 31.81 kg/m2 Well nourished, well developed, in no acute distress HEENT: normal Neck: no JVD Cardiac:  normal S1, S2; RRR; no murmur Lungs:  clear to auscultation bilaterally, no wheezing, rhonchi or rales Abd: soft, nontender, no hepatomegaly Ext: no edema Skin: warm and dry Neuro:  CNs 2-12 intact, no focal abnormalities noted     ASSESSMENT AND PLAN: 1.  Atypical chest pain - stress myoview showed no  ischemia 2.  Palpitations - the palpitations do not seem to be the primary trigger of her syncopal events - Event monitor is pending - so far no arrhythmias except PAC's 3.  Syncope most likely vasovagal in etiology - no further episodes on beta blockers - at this time will continue to follow her.  If she has another episode of syncope or presyncope will consider Tilt Table testing - continue Toprol 4.  HTN - well controlled after addition of Toprol  Followup with me in 2 months  Signed, Armanda Magicraci Prentis Langdon, MD 03/01/2014 11:23 AM

## 2014-03-01 NOTE — Patient Instructions (Signed)
Your physician recommends that you continue on your current medications as directed. Please refer to the Current Medication list given to you today.  Your physician recommends that you schedule a follow-up appointment in: 2 months with Dr Turner   

## 2014-03-30 ENCOUNTER — Telehealth: Payer: Self-pay | Admitting: Cardiology

## 2014-03-30 NOTE — Telephone Encounter (Signed)
lmtrc

## 2014-03-30 NOTE — Telephone Encounter (Signed)
Please let patient know that heart monitor showed NSR with HR ranging from 58-172bpm with occasional extra heart beats from the top of the heart which are benign. Please find out if patient works out around 8-8:30 in the am since most elevations in HR were around this time

## 2014-04-02 ENCOUNTER — Telehealth: Payer: Self-pay | Admitting: Cardiology

## 2014-04-02 NOTE — Telephone Encounter (Signed)
Pt is aware and stated she does work out at that time.

## 2014-04-02 NOTE — Telephone Encounter (Signed)
Patient is returning your call. Please call back.  °

## 2014-04-02 NOTE — Telephone Encounter (Signed)
Called pt back to give results. Pt is aware.

## 2014-05-08 ENCOUNTER — Ambulatory Visit (INDEPENDENT_AMBULATORY_CARE_PROVIDER_SITE_OTHER): Payer: BC Managed Care – PPO | Admitting: Cardiology

## 2014-05-08 VITALS — BP 120/89 | HR 99 | Ht 66.0 in | Wt 189.0 lb

## 2014-05-08 DIAGNOSIS — I10 Essential (primary) hypertension: Secondary | ICD-10-CM

## 2014-05-08 DIAGNOSIS — R55 Syncope and collapse: Secondary | ICD-10-CM

## 2014-05-08 DIAGNOSIS — R002 Palpitations: Secondary | ICD-10-CM

## 2014-05-08 DIAGNOSIS — R079 Chest pain, unspecified: Secondary | ICD-10-CM

## 2014-05-08 NOTE — Patient Instructions (Signed)
Your physician recommends that you continue on your current medications as directed. Please refer to the Current Medication list given to you today.  You have been referred to our EP Department Please set up before leaving today  Your physician recommends that you schedule a follow-up appointment in: 3 months with Dr Mayford Knifeurner

## 2014-05-08 NOTE — Progress Notes (Signed)
[address] [place], [place]  [address] Phone: [phone] Fax:  [phone]  Date:  05/08/2014   ID:  Toni PrimasLisa Y Lewis, DOB 09/01/1966, MRN [id number]  PCP:  Toni Lewis,ELIZABETH, MD  Cardiologist:  Toni Magicraci Toni Harkin, MD     History of Present Illness: Toni PrimasLisa Y Custis is a 48 y.o. female with a history of HTN and PUD who presented recently for evaluation of chest pain. She says the chest pain started last November. It has been episodic. The last the episode she had was March. 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 The pain was much worse with deep breathing and movement. She came home and took a shower and laid in bed and the she developed a sharp pain in her right leg and then felt like she needed to vomit and then felt like she needed to have a bowel movement. Then she got confused and lightheaded and diaphoretic and then passed out. This was witnessed by her mom and kept coming alert but would pass out again and EMS was called. On arrival her systolic pressure was 60 and HR was 40. She recovered after a 200cc IV saline bolus. She had another episode in March when she got chest pain while in bed and it awakened her. She then developed severe sharp pains in her legs and got out of bed and then started feeling nauseated and went to vomit but felt dizzy and sat down. She did not pass out this time. She also states that she has had palpitations with both episodes but does not think they started first. She could feel it up in her throat. She described a fluttering sensation as well. She occasionally has some LE edema. She denies any SOB. She underwent stress myoview that showed no ischemia and normal LVF. It was felt that she had vasovagal syncope and was started on Toprol XL 25mg  daily. 2D echo showed normal LVF. She presents back today for  followup. Heart monitor showed no arrhythmias except PAC's. She has continued to have some dizziness but no syncope.  She has continued to have some episodes of sharp chest pain that goes down the left arm that feels like an electrical shock and at the same time she feels her heart racing and feels it in her throat and she gets dizzy.     Wt Readings from Last 3 Encounters:  05/08/14 189 lb (85.73 kg)  03/01/14 197 lb (89.359 kg)  02/20/14 192 lb (87.091 kg)     Past Medical History  Diagnosis Date  . Endometriosis   . Peptic ulcer disease   . Anal fissure   . Hemorrhoids   . Hypertension     Current Outpatient Prescriptions  Medication Sig Dispense Refill  . albuterol (PROVENTIL HFA;VENTOLIN HFA) 108 (90 BASE) MCG/ACT inhaler Inhale 2 puffs into the lungs.      Marland Kitchen. amLODipine (NORVASC) 5 MG tablet Take 5 mg by mouth.      . cyclobenzaprine (FLEXERIL) 10 MG tablet Take 10 mg by mouth.       No current facility-administered medications for this visit.    Allergies:    Allergies  Allergen Reactions  . Vicodin [Hydrocodone-Acetaminophen]     Nausea and vomiting    Social History:  The patient  reports that she has never smoked. She has  never used smokeless tobacco. She reports that she drinks alcohol. She reports that she does not use illicit drugs.   Family History:  The patient's family history includes Heart attack in her mother; Heart disease in her mother; Heart failure in her mother.   ROS:  Please see the history of present illness.      All other systems reviewed and negative.   PHYSICAL EXAM: VS:  BP 120/89  Pulse 99  Ht 5\' 6"  (1.676 m)  Wt 189 lb (85.73 kg)  BMI 30.52 kg/m2 Well nourished, well developed, in no acute distress   ASSESSMENT AND PLAN:  1. Atypical chest pain - stress myoview showed no ischemia.  Her CP sound musculoskeletal and she has no ischemia on stress test. 2. Palpitations - the heart monitor was fine with PAC's but she still continues to  complain of palpitations leading to dizziness 3. Syncope most likely vasovagal in etiology - no further episodes on beta blockers  - at this time I am going to refer her for EP eval and possible loop recorder since she still complains of severe palpitations with dizziness.  It is unclear what the etiology of her symptoms are at this time. - continue Toprol  4. HTN - well controlled after addition of Toprol     Signed, Toni Magic, MD 05/08/2014 11:40 AM

## 2015-10-31 ENCOUNTER — Other Ambulatory Visit: Payer: Self-pay | Admitting: Family Medicine

## 2015-10-31 DIAGNOSIS — Z1231 Encounter for screening mammogram for malignant neoplasm of breast: Secondary | ICD-10-CM

## 2015-11-07 ENCOUNTER — Ambulatory Visit
Admission: RE | Admit: 2015-11-07 | Discharge: 2015-11-07 | Disposition: A | Discharge status: Home / Self Care | Payer: BLUE CROSS/BLUE SHIELD | Source: Ambulatory Visit | Attending: Family Medicine | Admitting: Family Medicine

## 2015-11-07 DIAGNOSIS — Z1231 Encounter for screening mammogram for malignant neoplasm of breast: Secondary | ICD-10-CM

## 2016-04-21 ENCOUNTER — Emergency Department (HOSPITAL_COMMUNITY): Payer: Worker's Compensation

## 2016-04-21 ENCOUNTER — Emergency Department (HOSPITAL_COMMUNITY)
Admission: EM | Admit: 2016-04-21 | Discharge: 2016-04-21 | Disposition: A | Discharge status: Home / Self Care | Payer: Worker's Compensation | Attending: Emergency Medicine | Admitting: Emergency Medicine

## 2016-04-21 ENCOUNTER — Encounter (HOSPITAL_COMMUNITY): Payer: Self-pay | Admitting: Emergency Medicine

## 2016-04-21 DIAGNOSIS — Y9389 Activity, other specified: Secondary | ICD-10-CM | POA: Insufficient documentation

## 2016-04-21 DIAGNOSIS — W1839XA Other fall on same level, initial encounter: Secondary | ICD-10-CM | POA: Diagnosis not present

## 2016-04-21 DIAGNOSIS — I10 Essential (primary) hypertension: Secondary | ICD-10-CM | POA: Diagnosis not present

## 2016-04-21 DIAGNOSIS — M25571 Pain in right ankle and joints of right foot: Secondary | ICD-10-CM | POA: Diagnosis present

## 2016-04-21 DIAGNOSIS — Y929 Unspecified place or not applicable: Secondary | ICD-10-CM | POA: Diagnosis not present

## 2016-04-21 DIAGNOSIS — Y990 Civilian activity done for income or pay: Secondary | ICD-10-CM | POA: Insufficient documentation

## 2016-04-21 MED ORDER — IBUPROFEN 400 MG PO TABS
400.0000 mg | ORAL_TABLET | Freq: Once | ORAL | Status: AC
  Administered 2016-04-21: 400 mg via ORAL
  Filled 2016-04-21: qty 1

## 2016-04-21 NOTE — ED Notes (Signed)
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 C/o pain right foot and ankle. Has ace wrap on at this time.

## 2016-04-21 NOTE — Discharge Instructions (Signed)
Read the information below.   X-rays were negative for fracture or dislocation. Suspect to have an ankle sprain. Wear splint and use crutches. Ice and elevate ankle for next 48 hours. You can take Tylenol or ibuprofen every 6 hours as needed for pain and inflammation control. Activity as tolerated. If treated properly, ankle sprains can be expected to recover completely; however, the length of recovery depends on the degree of injury.  A grade 1 sprain usually heals enough in 5 to 7 days to allow modified activity and requires an average of 6 weeks to heal completely.  A grade 2 sprain requires 6 to 10 weeks to heal completely.  A grade 3 sprain requires 12 to 16 weeks to heal.  A syndesmosis sprain often takes more than 3 months to heal. Be sure to follow up with your primary care provider. If symptoms are not improving in the next week I provided the contact information for orthopedics, you can call to schedule an appointment. You may return to the Emergency Department at any time for worsening condition or any new symptoms that concern you. Return to the emergency department if your symptoms worsen or you develop a fever, rapid bruising/swelling, numbness of your toes, or coldness of your foot.

## 2016-04-21 NOTE — ED Provider Notes (Signed)
CSN: [id number]     Arrival date & time 04/21/16  0902 History  By signing my name below, I, Rosario AdieWilliam Andrew Hiatt, attest that this documentation has been prepared under the direction and in the presence of Arvilla MeresAshley Meyer, PA-C.   Electronically Signed: Rosario AdieWilliam Andrew Hiatt, ED Scribe. 04/21/2016. 9:23 AM.   Chief Complaint  Patient presents with  . Foot Injury  . Ankle Injury   The history is provided by the patient. No language interpreter was used.   HPI Comments: Berenice PrimasLisa Y Mendosa is a 50 y.o. female with a PMHx for HTN who presents to the Emergency Department complaining of sudden onset, gradually improving, constant, 10/10 right lateral malleolus and 3rd through 5th metatarsal pain s/p fall that occurred approximately 8 hours PTA. Pt reports that she fell off of the back of a truck, and that her ankle was caught in between an open slot on her truck during the fall. She audibly heard and felt a "pop" sound during the fall, and notes that her ankle was inverted upon landing. She has associated edema to the area of pain, but notes that it has gradually improved since the incident. Pt has taken Tylenol (last dose approximately 2 hours prior to coming into the ED), and has applied ice with mild relief of her swelling and pain. She also has associated numbness and weakness in her right toes secondary to her pain. She denies any warmth to the area. Pt is not immunocompromised. She denies fever.    Past Medical History  Diagnosis Date  . Endometriosis   . Peptic ulcer disease   . Anal fissure   . Hemorrhoids   . Hypertension    Past Surgical History  Procedure Laterality Date  . Tonsillectomy    . Endometrial ablation     Family History  Problem Relation Age of Onset  . Heart failure Mother   . Heart attack Mother   . Heart disease Mother    Social History  Substance Use Topics  . Smoking status: Never Smoker   . Smokeless tobacco: Never Used  . Alcohol Use: Yes     Comment: occ--twice a  mnth    OB History    No data available     Review of Systems  Constitutional: Negative for fever.  Musculoskeletal: Positive for myalgias, joint swelling and arthralgias.  Allergic/Immunologic: Negative for immunocompromised state.  Neurological: Positive for weakness and numbness.    Allergies  Vicodin  Home Medications   Prior to Admission medications   Medication Sig Start Date End Date Taking? Authorizing Provider  albuterol (PROVENTIL HFA;VENTOLIN HFA) 108 (90 BASE) MCG/ACT inhaler Inhale 2 puffs into the lungs. 04/30/14   Historical Provider, MD  amLODipine (NORVASC) 5 MG tablet Take 5 mg by mouth. 04/30/14 04/30/15  Historical Provider, MD  cyclobenzaprine (FLEXERIL) 10 MG tablet Take 10 mg by mouth. 04/30/14   Historical Provider, MD   BP 133/99 mmHg  Pulse 85  Temp(Src) 98 F (36.7 C) (Oral)  Resp 18  Ht 5\' 6"  (1.676 m)  Wt 88.451 kg  BMI 31.49 kg/m2  SpO2 100%   Physical Exam  Constitutional: She appears well-developed and well-nourished. No distress.  HENT:  Head: Normocephalic and atraumatic.  Eyes: Conjunctivae are normal. No scleral icterus.  Neck: Normal range of motion.  Cardiovascular: Normal rate and intact distal pulses.   Pulmonary/Chest: Effort normal. No respiratory distress.  Musculoskeletal: She exhibits tenderness.  Distal pulses intact, capillary refill is <3 seconds. Swelling noted over  lateral malleolus. Exquisitely tender on the lateral malleolus, anterior portion of ankle, and all metatarsals w/o warmth or erythema noted. Able to move all toes. Sensation is intact. Active ROM is intact. Strength is intact but with pain. Pt able to ambulate; however, favors right ankle.  Neurological: She is alert.  Skin: Skin is warm and dry. She is not diaphoretic.  Psychiatric: She has a normal mood and affect. Her behavior is normal.  Nursing note and vitals reviewed.  ED Course  Procedures (including critical care time)  DIAGNOSTIC STUDIES: Oxygen  Saturation is 100% on RA, normal by my interpretation.   COORDINATION OF CARE: 9:19 AM-Discussed next steps with pt including DG right foot/ankle. Pt verbalized understanding and is agreeable with the plan.   Imaging Review Dg Ankle Complete Right  04/21/2016  CLINICAL DATA:  While unloading a truck at work this am, got right foot caught on something and fell off the truck, approx 3 ft. Pain all over right foot/ankle. EXAM: RIGHT ANKLE - COMPLETE 3+ VIEW COMPARISON:  None. FINDINGS: No acute fracture.  Ankle mortise is normally spaced and aligned. There are plantar and dorsal calcaneal spurs. Soft tissues are unremarkable. IMPRESSION: No acute fracture or dislocation. Electronically Signed   By: Amie Portlandavid  Ormond M.D.   On: 04/21/2016 09:40   Dg Foot Complete Right  04/21/2016  CLINICAL DATA:  Larey SeatFell off truck this morning, right foot pain EXAM: RIGHT FOOT COMPLETE - 3+ VIEW COMPARISON:  Right ankle same day FINDINGS: Three views of the right foot submitted. No acute fracture or subluxation. There is dorsal spurring tarsal region. Plantar and posterior spurring of calcaneus. IMPRESSION: No acute fracture or subluxation. Dorsal spurring tarsal region. Small plantar and posterior spur of calcaneus. Electronically Signed   By: Natasha MeadLiviu  Pop M.D.   On: 04/21/2016 09:41    I have personally reviewed and evaluated these images and lab results as part of my medical decision-making.   MDM   Final diagnoses:  Right ankle pain   Patient is afebrile and nontoxic-appearing. Her sugar is mildly elevated, with a history of elevated blood pressure and is currently on Norvasc. Physical exam remarkable for TTP of right lateral malleolus anterior ankle and metatarsals. Mild swelling noted in right lateral malleolus. Neurovascularly intact. Patient X-Ray negative for obvious fracture or dislocation. Pain medication given in ED. Pt advised to follow up with orthopedics if symptoms persist for possibility of missed fracture  diagnosis. Patient given brace while in ED, conservative therapy recommended and discussed. Patient will be dc home & is agreeable with above plan.  I personally performed the services described in this documentation, which was scribed in my presence. The recorded information has been reviewed and is accurate.     Lona KettleAshley Laurel Meyer, PA-C 04/21/16 1010  Melene Planan Floyd, DO 04/21/16 1137

## 2017-03-18 ENCOUNTER — Emergency Department (HOSPITAL_COMMUNITY): Payer: BLUE CROSS/BLUE SHIELD

## 2017-03-18 ENCOUNTER — Emergency Department (HOSPITAL_COMMUNITY)
Admission: EM | Admit: 2017-03-18 | Discharge: 2017-03-18 | Disposition: A | Discharge status: Home / Self Care | Payer: BLUE CROSS/BLUE SHIELD | Attending: Emergency Medicine | Admitting: Emergency Medicine

## 2017-03-18 ENCOUNTER — Other Ambulatory Visit: Payer: Self-pay

## 2017-03-18 ENCOUNTER — Encounter (HOSPITAL_COMMUNITY): Payer: Self-pay | Admitting: Emergency Medicine

## 2017-03-18 DIAGNOSIS — Y999 Unspecified external cause status: Secondary | ICD-10-CM | POA: Diagnosis not present

## 2017-03-18 DIAGNOSIS — W228XXA Striking against or struck by other objects, initial encounter: Secondary | ICD-10-CM | POA: Diagnosis not present

## 2017-03-18 DIAGNOSIS — Y929 Unspecified place or not applicable: Secondary | ICD-10-CM | POA: Diagnosis not present

## 2017-03-18 DIAGNOSIS — Y939 Activity, unspecified: Secondary | ICD-10-CM | POA: Insufficient documentation

## 2017-03-18 DIAGNOSIS — S0003XA Contusion of scalp, initial encounter: Secondary | ICD-10-CM | POA: Diagnosis not present

## 2017-03-18 DIAGNOSIS — E876 Hypokalemia: Secondary | ICD-10-CM

## 2017-03-18 DIAGNOSIS — I10 Essential (primary) hypertension: Secondary | ICD-10-CM | POA: Diagnosis not present

## 2017-03-18 DIAGNOSIS — Z79899 Other long term (current) drug therapy: Secondary | ICD-10-CM | POA: Diagnosis not present

## 2017-03-18 DIAGNOSIS — R0789 Other chest pain: Secondary | ICD-10-CM | POA: Diagnosis not present

## 2017-03-18 DIAGNOSIS — R110 Nausea: Secondary | ICD-10-CM

## 2017-03-18 DIAGNOSIS — R55 Syncope and collapse: Secondary | ICD-10-CM | POA: Diagnosis not present

## 2017-03-18 DIAGNOSIS — S0990XA Unspecified injury of head, initial encounter: Secondary | ICD-10-CM | POA: Diagnosis present

## 2017-03-18 LAB — COMPREHENSIVE METABOLIC PANEL
ALBUMIN: 3.7 g/dL (ref 3.5–5.0)
ALK PHOS: 39 U/L (ref 38–126)
ALT: 20 U/L (ref 14–54)
ANION GAP: 10 (ref 5–15)
AST: 35 U/L (ref 15–41)
BUN: 12 mg/dL (ref 6–20)
CALCIUM: 9 mg/dL (ref 8.9–10.3)
CHLORIDE: 99 mmol/L — AB (ref 101–111)
CO2: 28 mmol/L (ref 22–32)
CREATININE: 1.01 mg/dL — AB (ref 0.44–1.00)
GFR calc Af Amer: >60 mL/min (ref >60)
GFR calc non Af Amer: >60 mL/min (ref >60)
GLUCOSE: 104 mg/dL — AB (ref 65–99)
Potassium: 2.8 mmol/L — ABNORMAL LOW (ref 3.5–5.1)
SODIUM: 137 mmol/L (ref 135–145)
Total Bilirubin: 0.6 mg/dL (ref 0.3–1.2)
Total Protein: 6.1 g/dL — ABNORMAL LOW (ref 6.5–8.1)

## 2017-03-18 LAB — CBC
HEMATOCRIT: 36.7 % (ref 36.0–46.0)
HEMOGLOBIN: 11.8 g/dL — AB (ref 12.0–15.0)
MCH: 27.1 pg (ref 26.0–34.0)
MCHC: 32.2 g/dL (ref 30.0–36.0)
MCV: 84.2 fL (ref 78.0–100.0)
Platelets: 239 10*3/uL (ref 150–400)
RBC: 4.36 MIL/uL (ref 3.87–5.11)
RDW: 14.1 % (ref 11.5–15.5)
WBC: 5.4 10*3/uL (ref 4.0–10.5)

## 2017-03-18 LAB — MAGNESIUM: Magnesium: 2.1 mg/dL (ref 1.7–2.4)

## 2017-03-18 LAB — I-STAT TROPONIN, ED: Troponin i, poc: 0 ng/mL (ref 0.00–0.08)

## 2017-03-18 LAB — APTT: APTT: 26 s (ref 24–36)

## 2017-03-18 LAB — PROTIME-INR
INR: 1.03
PROTHROMBIN TIME: 13.5 s (ref 11.4–15.2)

## 2017-03-18 LAB — D-DIMER, QUANTITATIVE: D-Dimer, Quant: <0.27 ug/mL-FEU (ref 0.00–0.50)

## 2017-03-18 MED ORDER — SODIUM CHLORIDE 0.9 % IV BOLUS (SEPSIS)
1000.0000 mL | Freq: Once | INTRAVENOUS | Status: AC
  Administered 2017-03-18: 1000 mL via INTRAVENOUS

## 2017-03-18 MED ORDER — POTASSIUM CHLORIDE CRYS ER 20 MEQ PO TBCR: 20.0000 meq | EXTENDED_RELEASE_TABLET | Freq: Two times a day (BID) | ORAL | 0 refills | Status: DC

## 2017-03-18 MED ORDER — POTASSIUM CHLORIDE CRYS ER 20 MEQ PO TBCR
40.0000 meq | EXTENDED_RELEASE_TABLET | Freq: Once | ORAL | Status: AC
  Administered 2017-03-18: 40 meq via ORAL
  Filled 2017-03-18: qty 2

## 2017-03-18 MED ORDER — ONDANSETRON 4 MG PO TBDP: 4.0000 mg | ORAL_TABLET | Freq: Three times a day (TID) | ORAL | 0 refills | Status: DC | PRN

## 2017-03-18 MED ORDER — KETOROLAC TROMETHAMINE 30 MG/ML IJ SOLN
30.0000 mg | Freq: Once | INTRAMUSCULAR | Status: AC
  Administered 2017-03-18: 30 mg via INTRAVENOUS
  Filled 2017-03-18: qty 1

## 2017-03-18 MED ORDER — IOPAMIDOL (ISOVUE-370) INJECTION 76%
INTRAVENOUS | Status: AC
  Administered 2017-03-18: 100 mL
  Filled 2017-03-18: qty 100

## 2017-03-18 NOTE — ED Notes (Signed)
Dr. Mackuen at bedside at this time.  

## 2017-03-18 NOTE — ED Notes (Signed)
Pt returns from CT where she was unable to complete ct due to leg cramps. Pt stood beside scanner bed to put pressure on legs and stop cramps and then became weak. assisted back to bed by staff with no fall. Pt returns to room.

## 2017-03-18 NOTE — ED Provider Notes (Addendum)
[facility]Y DEPT Provider Note   CSN: [id number] Arrival date & time: 03/18/17  0506  Time seen 05:51 AM   History   Chief Complaint Chief Complaint  Patient presents with  . Loss of Consciousness  . Chest Pain    HPI Toni Lewis is a 51 y.o. female.  HPI  patient states about a week ago she started having chest pain and trouble swallowing and describes them as tightness. She states that was there constantly until May 29 when it became more intermittent. She states it would now last a few hours at a time. Nothing she does makes it feel worse, nothing she does makes it feel better. She describes shortness of breath and a dry cough. She has not had fever or chills. She has had some nausea without vomiting or diaphoresis. She states she had something similar before and it was "nothing". She reports she started getting a headache at 3 PM. She states she has pain in the back of her head that she describes as sharp, she states and comes and goes and lasts a few hours at a time. She describes photophobia and phonophobia. She denies any visual changes, or numbness or tingling of her extremities. She relates at 3 AM she was awakened with severe in her bilateral thigh leg cramps. She was trying to walk to the kitchen to get mustard cramping she had a syncopal episode. Her mother heard her fall and states she was unconscious for a few seconds. She had nausea and some urinary incontinence. Afterwards she did have one diarrhea stool. She denies any abdominal cramping. She has had syncope before when she had a prior episode of chest pain. She reports prior to this she has been having a normal appetite.  EMS reported initial heart rate 47.  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  PCP Lewis Moccasin, MD   Past Medical History:  Diagnosis Date  . Anal fissure   . Endometriosis   . Hemorrhoids   . Hypertension   . Peptic ulcer disease      Patient Active Problem List   Diagnosis Date Noted  . Benign essential HTN 02/01/2014  . Chest pain 02/01/2014  . Syncope, vasovagal 02/01/2014  . Palpitations 02/01/2014    Past Surgical History:  Procedure Laterality Date  . ENDOMETRIAL ABLATION    . TONSILLECTOMY      OB History    No data available       Home Medications    Prior to Admission medications   Medication Sig Start Date End Date Taking? Authorizing Provider  albuterol (PROVENTIL HFA;VENTOLIN HFA) 108 (90 BASE) MCG/ACT inhaler Inhale 2 puffs into the lungs. 04/30/14   [provider]  amLODipine (NORVASC) 5 MG tablet Take 5 mg by mouth. 04/30/14 04/30/15  [provider]  cyclobenzaprine (FLEXERIL) 10 MG tablet Take 10 mg by mouth. 04/30/14   [provider]    Family History Family History  Problem Relation Age of Onset  . Heart failure Mother   . Heart attack Mother   . Heart disease Mother     Social History Social History  Substance Use Topics  . Smoking status: Never Smoker  . Smokeless tobacco: Never Used  . Alcohol use Yes     Comment: occ--twice a mnth   lives with mother No alcohol tonight Employed  Allergies   Vicodin [hydrocodone-acetaminophen]   Review of Systems Review of Systems  All other  systems reviewed and are negative.    Physical Exam Updated Vital Signs BP 134/86   Pulse (!) 59   Resp 17   Wt 88.5 kg (195 lb)   SpO2 100%   BMI 31.47 kg/m   Vital signs normal    Physical Exam  Constitutional: She is oriented to person, place, and time. She appears well-developed and well-nourished.  Non-toxic appearance. She does not appear ill. No distress.  HENT:  Head: Normocephalic and atraumatic.  Right Ear: External ear normal.  Left Ear: External ear normal.  Nose: Nose normal. No mucosal edema or rhinorrhea.  Mouth/Throat: Oropharynx is clear and moist and mucous membranes are normal. No dental abscesses or uvula swelling.  Eyes:  Conjunctivae and EOM are normal. Pupils are equal, round, and reactive to light.  Neck: Normal range of motion and full passive range of motion without pain. Neck supple.  Cardiovascular: Normal rate, regular rhythm and normal heart sounds.  Exam reveals no gallop and no friction rub.   No murmur heard. Pulmonary/Chest: Effort normal and breath sounds normal. No respiratory distress. She has no wheezes. She has no rhonchi. She has no rales. She exhibits no tenderness and no crepitus.  Abdominal: Soft. Normal appearance and bowel sounds are normal. She exhibits no distension. There is no tenderness. There is no rebound and no guarding.  Musculoskeletal: Normal range of motion. She exhibits no edema or tenderness.  Moves all extremities well.   Neurological: She is alert and oriented to person, place, and time. She has normal strength. No cranial nerve deficit.  Skin: Skin is warm, dry and intact. No rash noted. No erythema. No pallor.  Psychiatric: She has a normal mood and affect. Her speech is normal and behavior is normal. Her mood appears not anxious.  Nursing note and vitals reviewed.    ED Treatments / Results  Labs (all labs ordered are listed, but only abnormal results are displayed) Results for orders placed or performed during the hospital encounter of 03/18/17  CBC  Result Value Ref Range   WBC 5.4 4.0 - 10.5 K/uL   RBC 4.36 3.87 - 5.11 MIL/uL   Hemoglobin 11.8 (L) 12.0 - 15.0 g/dL   HCT 16.136.7 09.636.0 - 04.546.0 %   MCV 84.2 78.0 - 100.0 fL   MCH 27.1 26.0 - 34.0 pg   MCHC 32.2 30.0 - 36.0 g/dL   RDW 40.914.1 81.111.5 - 91.415.5 %   Platelets 239 150 - 400 K/uL  Comprehensive metabolic panel  Result Value Ref Range   Sodium 137 135 - 145 mmol/L   Potassium 2.8 (L) 3.5 - 5.1 mmol/L   Chloride 99 (L) 101 - 111 mmol/L   CO2 28 22 - 32 mmol/L   Glucose, Bld 104 (H) 65 - 99 mg/dL   BUN 12 6 - 20 mg/dL   Creatinine, Ser 7.821.01 (H) 0.44 - 1.00 mg/dL   Calcium 9.0 8.9 - 95.610.3 mg/dL   Total Protein  6.1 (L) 6.5 - 8.1 g/dL   Albumin 3.7 3.5 - 5.0 g/dL   AST 35 15 - 41 U/L   ALT 20 14 - 54 U/L   Alkaline Phosphatase 39 38 - 126 U/L   Total Bilirubin 0.6 0.3 - 1.2 mg/dL   GFR calc non Af Amer >60 >60 mL/min   GFR calc Af Amer >60 >60 mL/min   Anion gap 10 5 - 15  D-dimer, quantitative  Result Value Ref Range   D-Dimer, Quant <0.27 0.00 - 0.50 ug/mL-FEU  Protime-INR  Result Value Ref Range   Prothrombin Time 13.5 11.4 - 15.2 seconds   INR 1.03   APTT  Result Value Ref Range   aPTT 26 24 - 36 seconds   Laboratory interpretation all normal except Hypokalemia, mild anemia    EKG  EKG Interpretation  Date/Time:  Thursday Mar 18 2017 05:06:35 EDT Ventricular Rate:  60 PR Interval:    QRS Duration: 98 QT Interval:  504 QTC Calculation: 504 R Axis:   54 Text Interpretation:  Sinus rhythm Borderline prolonged QT interval No significant change since last tracing 20 Feb 2014 Confirmed by Devoria Albe ([id number]) on 03/18/2017 5:53:12 AM       Radiology Dg Chest 2 View  Result Date: 03/18/2017 CLINICAL DATA:  Left-sided chest pain. EXAM: CHEST  2 VIEW COMPARISON:  09/14/2013. FINDINGS: Mediastinum and hilar structures normal. Lungs are clear. No pleural effusion or pneumothorax. Heart size normal. No acute bony abnormality. Degenerative changes thoracic spine and both shoulders. IMPRESSION: No acute cardiopulmonary disease. Electronically Signed   By: Maisie Fus  Register   On: 03/18/2017 06:25   Ct Head Wo Contrast  Result Date: 03/18/2017 CLINICAL DATA:  Syncopal episode today, striking the head. EXAM: CT HEAD WITHOUT CONTRAST TECHNIQUE: Contiguous axial images were obtained from the base of the skull through the vertex without intravenous contrast. COMPARISON:  None. FINDINGS: Brain: No evidence of malformation, atrophy, old or acute small or large vessel infarction, mass lesion, hemorrhage, hydrocephalus or extra-axial collection. No evidence of pituitary lesion. Vascular: There is  atherosclerotic calcification of the major vessels at the base of the brain. Skull: Normal.  No fracture or focal bone lesion. Sinuses/Orbits: Visualized sinuses are clear. No fluid in the middle ears or mastoids. Visualized orbits are normal. Other: None significant IMPRESSION: Normal head CT with the exception of atherosclerotic calcification of the vessels at the base of the brain, somewhat premature for age. Electronically Signed   By: Paulina Fusi M.D.   On: 03/18/2017 09:04    Procedures Procedures (including critical care time)  Medications Ordered in ED Medications  sodium chloride 0.9 % bolus 1,000 mL (not administered)  ketorolac (TORADOL) 30 MG/ML injection 30 mg (30 mg Intravenous Given 03/18/17 0741)  sodium chloride 0.9 % bolus 1,000 mL (1,000 mLs Intravenous New Bag/Given 03/18/17 0746)  sodium chloride 0.9 % bolus 1,000 mL (1,000 mLs Intravenous New Bag/Given 03/18/17 0741)  iopamidol (ISOVUE-370) 76 % injection (100 mLs  Contrast Given 03/18/17 0826)  potassium chloride SA (K-DUR,KLOR-CON) CR tablet 40 mEq (40 mEq Oral Given 03/18/17 0748)     Initial Impression / Assessment and Plan / ED Course  I have reviewed the triage vital signs and the nursing notes.  Pertinent labs & imaging results that were available during my care of the patient were reviewed by me and considered in my medical decision making (see chart for details).  CT of the head was ordered due to her having a syncopal episode and hitting her head, due to her history of driving trucks and having leg cramps and syncope with chest pain and shortness of breath CT angiogram was done to look for PE. Her syncopal episode however does sound like it may be vasovagal with the bradycardia.  After reviewing her labs she was given oral potassium for hypokalemia.  Recheck at 9 AM patient has only got half of her IV fluids she has her arm flexed and her IV wasn't running. I showed her how to keep her arms straight to the  IVs  will run. We discussed her test results however her CT scans were not resulted yet. Also her i-STAT troponin was never resulted. Nursing staff say they will make sure that gets done.  Patient left at change of shift with dayshift doctor to check her CT results, check her magnesium and her troponin levels.  Final Clinical Impressions(s) / ED Diagnoses   Final diagnoses:  Vasovagal syncope  Chest tightness  Hypokalemia  Nausea  Contusion of scalp, initial encounter    Disposition pending   Devoria Albe, MD, Concha Pyo, MD 03/18/17 1610    Devoria Albe, MD 03/18/17 [phone]

## 2017-03-18 NOTE — ED Triage Notes (Addendum)
[facility] with c/o cp x 2 days and syncope after getting up to go to bathroom. CP is L sided, radiates to L arm and neck. Per EMS, pt had leg cramps this morning, was walking to restroom and passed out, hitting back of head on floor. Family states LOC was 1 min. When EMS arrived, pt was brady (HR 47) and pt had incontinence and diarrhea. Pt lethargic, a&ox3, denies n/v. CBG was 157, BP 130/60. 324 ASA given

## 2017-09-20 ENCOUNTER — Ambulatory Visit (HOSPITAL_COMMUNITY)
Admission: EM | Admit: 2017-09-20 | Discharge: 2017-09-20 | Disposition: A | Discharge status: Home / Self Care | Payer: BLUE CROSS/BLUE SHIELD | Attending: Family Medicine | Admitting: Family Medicine

## 2017-09-20 ENCOUNTER — Other Ambulatory Visit: Payer: Self-pay

## 2017-09-20 ENCOUNTER — Encounter (HOSPITAL_COMMUNITY): Payer: Self-pay | Admitting: Emergency Medicine

## 2017-09-20 DIAGNOSIS — R05 Cough: Secondary | ICD-10-CM

## 2017-09-20 DIAGNOSIS — J111 Influenza due to unidentified influenza virus with other respiratory manifestations: Secondary | ICD-10-CM

## 2017-09-20 DIAGNOSIS — R69 Illness, unspecified: Secondary | ICD-10-CM

## 2017-09-20 DIAGNOSIS — R059 Cough, unspecified: Secondary | ICD-10-CM

## 2017-09-20 MED ORDER — HYDROCODONE-HOMATROPINE 5-1.5 MG/5ML PO SYRP: 5.0000 mL | ORAL_SOLUTION | Freq: Four times a day (QID) | ORAL | 0 refills | Status: DC | PRN

## 2017-09-20 NOTE — ED Triage Notes (Signed)
Pt c/o fever yesterday of 101, woke up this morning with chills, achy, nausea.

## 2017-09-20 NOTE — ED Provider Notes (Signed)
  [facility]   [id number] 09/20/17 Arrival Time: 1140  ASSESSMENT & PLAN:  1. Influenza-like illness   2. Cough     Meds ordered this encounter  Medications  . HYDROcodone-homatropine (HYCODAN) 5-1.5 MG/5ML syrup    Sig: Take 5 mLs by mouth every 6 (six) hours as needed for cough.    Dispense:  60 mL    Refill:  0   Medication sedation precautions. OTC symptom care as needed. May f/u with PCP or here as needed. Ensure adequate fluid intake and rest. Reviewed expectations re: course of current medical issues. Questions answered. Outlined signs and symptoms indicating need for more acute intervention. Patient verbalized understanding. After Visit Summary given.   SUBJECTIVE:  Toni Lewis is a 51 y.o. female who presents with complaint of nasal congestion, post-nasal drainage, and a persistent cough. Onset abrupt approximately 4 days ago. Overall fatigued. SOB: none. Wheezing: none. Coughing bothering her the most; affecting sleep. No n/v. Chills and recorded temperature of 101 degrees F yesterday. Tylenol helps.  Social History   Tobacco Use  Smoking Status Never Smoker  Smokeless Tobacco Never Used     ROS: As per HPI.   OBJECTIVE:  Vitals:   09/20/17 1159  BP: (!) 143/92  Pulse: 73  Resp: 20  Temp: 98 F (36.7 C)  SpO2: 100%     General appearance: alert; no distress but appears fatigued HEENT: nasal congestion; clear runny nose; throat irritation secondary to post-nasal drainage Neck: supple without LAD Lungs: clear to auscultation bilaterally; dry cough Skin: warm and dry Psychological: alert and cooperative; normal mood and affect    Allergies  Allergen Reactions  . Vicodin [Hydrocodone-Acetaminophen]     Nausea and vomiting    Past Medical History:  Diagnosis Date  . Anal fissure   . Endometriosis   . Hemorrhoids   . Hypertension   . Peptic ulcer disease    Social History   Socioeconomic History  . Marital status: Single      Spouse name: Not on file  . Number of children: Not on file  . Years of education: Not on file  . Highest education level: Not on file  Social Needs  . Financial resource strain: Not on file  . Food insecurity - worry: Not on file  . Food insecurity - inability: Not on file  . Transportation needs - medical: Not on file  . Transportation needs - non-medical: Not on file  Occupational History  . Occupation: [employer]     Comment: Driver   Tobacco Use  . Smoking status: Never Smoker  . Smokeless tobacco: Never Used  Substance and Sexual Activity  . Alcohol use: Yes    Comment: occ--twice a mnth   . Drug use: No  . Sexual activity: No  Other Topics Concern  . Not on file  Social History Narrative  . Not on file   Family History  Problem Relation Age of Onset  . Heart failure Mother   . Heart attack Mother   . Heart disease Mother            Mardella LaymanHagler, Niala Stcharles, MD 09/20/17 [phone]

## 2017-09-20 NOTE — Discharge Instructions (Signed)
Be aware, your cough medication may cause drowsiness. Please do not drive, operate heavy machinery or make important decisions while on this medication, it can cloud your judgement.  

## 2017-11-18 ENCOUNTER — Other Ambulatory Visit: Payer: Self-pay | Admitting: Family Medicine

## 2017-11-18 DIAGNOSIS — Z139 Encounter for screening, unspecified: Secondary | ICD-10-CM

## 2017-12-07 ENCOUNTER — Ambulatory Visit
Admission: RE | Admit: 2017-12-07 | Discharge: 2017-12-07 | Disposition: A | Discharge status: Home / Self Care | Payer: BLUE CROSS/BLUE SHIELD | Source: Ambulatory Visit | Attending: Family Medicine | Admitting: Family Medicine

## 2017-12-07 DIAGNOSIS — Z139 Encounter for screening, unspecified: Secondary | ICD-10-CM

## 2018-10-20 ENCOUNTER — Emergency Department (HOSPITAL_COMMUNITY): Payer: BLUE CROSS/BLUE SHIELD

## 2018-10-20 ENCOUNTER — Emergency Department (HOSPITAL_COMMUNITY)
Admission: EM | Admit: 2018-10-20 | Discharge: 2018-10-20 | Disposition: A | Discharge status: Home / Self Care | Payer: BLUE CROSS/BLUE SHIELD | Attending: Emergency Medicine | Admitting: Emergency Medicine

## 2018-10-20 ENCOUNTER — Encounter (HOSPITAL_COMMUNITY): Payer: Self-pay | Admitting: *Deleted

## 2018-10-20 DIAGNOSIS — I10 Essential (primary) hypertension: Secondary | ICD-10-CM | POA: Insufficient documentation

## 2018-10-20 DIAGNOSIS — R05 Cough: Secondary | ICD-10-CM | POA: Diagnosis present

## 2018-10-20 DIAGNOSIS — J40 Bronchitis, not specified as acute or chronic: Secondary | ICD-10-CM | POA: Insufficient documentation

## 2018-10-20 DIAGNOSIS — Z79899 Other long term (current) drug therapy: Secondary | ICD-10-CM | POA: Insufficient documentation

## 2018-10-20 MED ORDER — AZITHROMYCIN 250 MG PO TABS: 250.0000 mg | ORAL_TABLET | Freq: Every day | ORAL | 0 refills | Status: DC

## 2018-10-20 MED ORDER — ALBUTEROL SULFATE HFA 108 (90 BASE) MCG/ACT IN AERS
2.0000 | INHALATION_SPRAY | Freq: Once | RESPIRATORY_TRACT | Status: AC
Start: 2018-10-20 — End: 2018-10-20
  Administered 2018-10-20: 2 via RESPIRATORY_TRACT
  Filled 2018-10-20: qty 6.7

## 2018-10-20 NOTE — ED Triage Notes (Signed)
Pt in c/o cough and congestion for the last month, states she was seen at her PCP and given cough medicine but symptoms have not improved, having more pain and cough is productive with green sputum

## 2018-10-20 NOTE — ED Provider Notes (Signed)
[facility] EMERGENCY DEPARTMENT Provider Note   CSN: [id number] Arrival date & time: 10/20/18  1145     History   Chief Complaint Chief Complaint  Patient presents with  . Cough    HPI Toni Lewis is a 53 y.o. female.  She is complaining of cough congestion wheezing for over a month.  She saw her PCP recently and it sounds like they put her on some cough medicine.  She said it has been worse over the last couple of days and more difficulty breathing she has had some chest pain.  Cough is productive of some green sputum with a little streak of blood in it.  She said she has had fevers on and off.  Little bit of diarrhea.  The history is provided by the patient.  Cough  This is a new problem. The current episode started more than 1 week ago. The problem occurs every few minutes. The problem has been gradually worsening. The cough is productive of blood-tinged sputum. The maximum temperature recorded prior to her arrival was 100 to 100.9 F. Associated symptoms include chest pain, chills and sweats. Pertinent negatives include no sore throat and no shortness of breath. She has tried cough syrup for the symptoms. The treatment provided mild relief. She is not a smoker.    Past Medical History:  Diagnosis Date  . Anal fissure   . Endometriosis   . Hemorrhoids   . Hypertension   . Peptic ulcer disease     Patient Active Problem List   Diagnosis Date Noted  . Benign essential HTN 02/01/2014  . Chest pain 02/01/2014  . Syncope, vasovagal 02/01/2014  . Palpitations 02/01/2014    Past Surgical History:  Procedure Laterality Date  . ENDOMETRIAL ABLATION    . TONSILLECTOMY       OB History   No obstetric history on file.      Home Medications    Prior to Admission medications   Medication Sig Start Date End Date Taking? Authorizing Provider  albuterol (PROVENTIL HFA;VENTOLIN HFA) 108 (90 BASE) MCG/ACT inhaler Inhale 2 puffs into the lungs. 04/30/14    [provider]  amLODipine (NORVASC) 5 MG tablet Take 5 mg by mouth. 04/30/14 04/30/15  [provider]  cyclobenzaprine (FLEXERIL) 10 MG tablet Take 10 mg by mouth. 04/30/14   [provider]  HYDROcodone-homatropine (HYCODAN) 5-1.5 MG/5ML syrup Take 5 mLs by mouth every 6 (six) hours as needed for cough. 09/20/17   Mardella Layman, MD  LOSARTAN POTASSIUM PO Take by mouth.    [provider]  ondansetron (ZOFRAN ODT) 4 MG disintegrating tablet Take 1 tablet (4 mg total) by mouth every 8 (eight) hours as needed for nausea or vomiting. 03/18/17   Mackuen, Courteney Lyn, MD  potassium chloride SA (K-DUR,KLOR-CON) 20 MEQ tablet Take 1 tablet (20 mEq total) by mouth 2 (two) times daily. 03/18/17 03/23/17  Mackuen, Courteney Lyn, MD  Pregabalin (LYRICA PO) Take by mouth.    [provider]    Family History Family History  Problem Relation Age of Onset  . Heart failure Mother   . Heart attack Mother   . Heart disease Mother   . Breast cancer Maternal Aunt     Social History Social History   Tobacco Use  . Smoking status: Never Smoker  . Smokeless tobacco: Never Used  Substance Use Topics  . Alcohol use: Yes    Comment: occ--twice a mnth   . Drug use: No  Allergies   Vicodin [hydrocodone-acetaminophen]   Review of Systems Review of Systems  Constitutional: Positive for chills. Negative for fever.  HENT: Negative for sore throat.   Eyes: Negative for visual disturbance.  Respiratory: Positive for cough. Negative for shortness of breath.   Cardiovascular: Positive for chest pain.  Gastrointestinal: Negative for abdominal pain.  Genitourinary: Negative for dysuria.  Musculoskeletal: Negative for neck pain.  Skin: Negative for rash.  Neurological: Negative for seizures.     Physical Exam Updated Vital Signs BP (!) 141/111 (BP Location: Right Arm)   Pulse 91   Temp 98.6 F (37 C) (Oral) Comment: Pt states she took "2 Tylenol, maybe  500mg  each last night at this morning"  Resp 14   Ht 5\' 6"  (1.676 m)   SpO2 100%   BMI 31.47 kg/m   Physical Exam Vitals signs and nursing note reviewed.  Constitutional:      General: She is not in acute distress.    Appearance: She is well-developed.  HENT:     Head: Normocephalic and atraumatic.     Right Ear: Tympanic membrane normal.     Left Ear: Tympanic membrane normal.     Mouth/Throat:     Mouth: Mucous membranes are moist.     Pharynx: Oropharynx is clear.  Eyes:     Conjunctiva/sclera: Conjunctivae normal.  Neck:     Musculoskeletal: Neck supple.  Cardiovascular:     Rate and Rhythm: Normal rate and regular rhythm.     Heart sounds: No murmur.  Pulmonary:     Effort: Pulmonary effort is normal. No respiratory distress.     Breath sounds: Normal breath sounds.  Abdominal:     Palpations: Abdomen is soft.     Tenderness: There is no abdominal tenderness.  Musculoskeletal:     Right lower leg: No edema.     Left lower leg: No edema.  Skin:    General: Skin is warm and dry.     Capillary Refill: Capillary refill takes less than 2 seconds.  Neurological:     General: No focal deficit present.     Mental Status: She is alert.     Gait: Gait normal.      ED Treatments / Results  Labs (all labs ordered are listed, but only abnormal results are displayed) Labs Reviewed - No data to display  EKG None  Radiology Dg Chest 2 View  Result Date: 10/20/2018 CLINICAL DATA:  Cough, fever. EXAM: CHEST - 2 VIEW COMPARISON:  Radiographs of Mar 18, 2017. FINDINGS: The heart size and mediastinal contours are within normal limits. Both lungs are clear. The visualized skeletal structures are unremarkable. IMPRESSION: No active cardiopulmonary disease. Electronically Signed   By: Lupita RaiderJames  Green Jr, M.D.   On: 10/20/2018 13:09    Procedures Procedures (including critical care time)  Medications Ordered in ED Medications  albuterol (PROVENTIL HFA;VENTOLIN HFA) 108 (90  Base) MCG/ACT inhaler 2 puff (has no administration in time range)     Initial Impression / Assessment and Plan / ED Course  I have reviewed the triage vital signs and the nursing notes.  Pertinent labs & imaging results that were available during my care of the patient were reviewed by me and considered in my medical decision making (see chart for details).  Clinical Course as of Oct 21 856  Thu Oct 20, 2018  1337 Chest x-ray and EKG unremarkable.  Patient felt better after the albuterol MDI.  Will discharge her with that and  put her on some antibiotics for empiric treatment for bronchitis as her symptoms have been going on for almost a month.   [MB]    Clinical Course User Index [MB] Terrilee FilesButler, Mcihael Hinderman C, MD     Final Clinical Impressions(s) / ED Diagnoses   Final diagnoses:  Bronchitis    ED Discharge Orders         Ordered    azithromycin (ZITHROMAX) 250 MG tablet  Daily     10/20/18 1339           Terrilee FilesButler, Yaqub Arney C, MD 10/21/18 [phone]

## 2018-10-20 NOTE — Discharge Instructions (Addendum)
You were evaluated in the emergency department for 1 month of cough and congestion.  You had a chest x-ray and an EKG that did not show any obvious abnormalities.  We are sending you home with a prescription for antibiotics and an inhaler.  Use the inhaler 2 puffs every 4-6 hours as needed.  Drink plenty of fluids.  Follow-up with your doctor.

## 2018-11-24 ENCOUNTER — Other Ambulatory Visit: Payer: Self-pay

## 2018-11-24 ENCOUNTER — Emergency Department (HOSPITAL_COMMUNITY): Payer: BLUE CROSS/BLUE SHIELD

## 2018-11-24 ENCOUNTER — Encounter (HOSPITAL_COMMUNITY): Payer: Self-pay | Admitting: Emergency Medicine

## 2018-11-24 ENCOUNTER — Ambulatory Visit (INDEPENDENT_AMBULATORY_CARE_PROVIDER_SITE_OTHER)
Admission: EM | Admit: 2018-11-24 | Discharge: 2018-11-24 | Disposition: A | Discharge status: Home / Self Care | Payer: BLUE CROSS/BLUE SHIELD | Source: Home / Self Care | Attending: Family Medicine | Admitting: Family Medicine

## 2018-11-24 ENCOUNTER — Emergency Department (HOSPITAL_COMMUNITY)
Admission: EM | Admit: 2018-11-24 | Discharge: 2018-11-24 | Disposition: A | Discharge status: Home / Self Care | Payer: BLUE CROSS/BLUE SHIELD | Attending: Emergency Medicine | Admitting: Emergency Medicine

## 2018-11-24 DIAGNOSIS — K5792 Diverticulitis of intestine, part unspecified, without perforation or abscess without bleeding: Secondary | ICD-10-CM | POA: Insufficient documentation

## 2018-11-24 DIAGNOSIS — Z3202 Encounter for pregnancy test, result negative: Secondary | ICD-10-CM

## 2018-11-24 DIAGNOSIS — I10 Essential (primary) hypertension: Secondary | ICD-10-CM | POA: Insufficient documentation

## 2018-11-24 DIAGNOSIS — Z79899 Other long term (current) drug therapy: Secondary | ICD-10-CM | POA: Insufficient documentation

## 2018-11-24 DIAGNOSIS — R1031 Right lower quadrant pain: Secondary | ICD-10-CM

## 2018-11-24 DIAGNOSIS — R509 Fever, unspecified: Secondary | ICD-10-CM

## 2018-11-24 LAB — COMPREHENSIVE METABOLIC PANEL
ALT: 34 U/L (ref 0–44)
ANION GAP: 15 (ref 5–15)
AST: 29 U/L (ref 15–41)
Albumin: 3.8 g/dL (ref 3.5–5.0)
Alkaline Phosphatase: 42 U/L (ref 38–126)
BUN: 11 mg/dL (ref 6–20)
CO2: 28 mmol/L (ref 22–32)
Calcium: 9.4 mg/dL (ref 8.9–10.3)
Chloride: 97 mmol/L — ABNORMAL LOW (ref 98–111)
Creatinine, Ser: 0.98 mg/dL (ref 0.44–1.00)
GFR calc non Af Amer: >60 mL/min (ref >60)
Glucose, Bld: 116 mg/dL — ABNORMAL HIGH (ref 70–99)
Potassium: 3.7 mmol/L (ref 3.5–5.1)
Sodium: 140 mmol/L (ref 135–145)
Total Bilirubin: 0.7 mg/dL (ref 0.3–1.2)
Total Protein: 7.3 g/dL (ref 6.5–8.1)

## 2018-11-24 LAB — CBC
HCT: 39.2 % (ref 36.0–46.0)
Hemoglobin: 12.4 g/dL (ref 12.0–15.0)
MCH: 26.7 pg (ref 26.0–34.0)
MCHC: 31.6 g/dL (ref 30.0–36.0)
MCV: 84.5 fL (ref 80.0–100.0)
NRBC: 0 % (ref 0.0–0.2)
Platelets: 300 10*3/uL (ref 150–400)
RBC: 4.64 MIL/uL (ref 3.87–5.11)
RDW: 15.4 % (ref 11.5–15.5)
WBC: 9.6 10*3/uL (ref 4.0–10.5)

## 2018-11-24 LAB — POCT URINALYSIS DIP (DEVICE)
Bilirubin Urine: NEGATIVE
Glucose, UA: NEGATIVE mg/dL
Hgb urine dipstick: NEGATIVE
Ketones, ur: NEGATIVE mg/dL
Leukocytes, UA: NEGATIVE
Nitrite: NEGATIVE
Protein, ur: NEGATIVE mg/dL
SPECIFIC GRAVITY, URINE: 1.025 (ref 1.005–1.030)
UROBILINOGEN UA: 0.2 mg/dL (ref 0.0–1.0)
pH: 5.5 (ref 5.0–8.0)

## 2018-11-24 LAB — POCT PREGNANCY, URINE: Preg Test, Ur: NEGATIVE

## 2018-11-24 MED ORDER — AMOXICILLIN-POT CLAVULANATE ER 1000-62.5 MG PO TB12: 2.0000 | ORAL_TABLET | Freq: Two times a day (BID) | ORAL | 0 refills | Status: DC

## 2018-11-24 MED ORDER — SODIUM CHLORIDE 0.9 % IV BOLUS
1000.0000 mL | Freq: Once | INTRAVENOUS | Status: AC
  Administered 2018-11-24: 1000 mL via INTRAVENOUS

## 2018-11-24 MED ORDER — IBUPROFEN 600 MG PO TABS: 600.0000 mg | ORAL_TABLET | Freq: Three times a day (TID) | ORAL | 0 refills | Status: DC | PRN

## 2018-11-24 MED ORDER — ONDANSETRON 8 MG PO TBDP: 8.0000 mg | ORAL_TABLET | Freq: Three times a day (TID) | ORAL | 0 refills | Status: DC | PRN

## 2018-11-24 MED ORDER — HYDROMORPHONE HCL 1 MG/ML IJ SOLN
1.0000 mg | Freq: Once | INTRAMUSCULAR | Status: AC
  Administered 2018-11-24: 1 mg via INTRAMUSCULAR
  Filled 2018-11-24: qty 1

## 2018-11-24 MED ORDER — IOHEXOL 300 MG/ML  SOLN
100.0000 mL | Freq: Once | INTRAMUSCULAR | Status: AC | PRN
  Administered 2018-11-24: 100 mL via INTRAVENOUS

## 2018-11-24 MED ORDER — ONDANSETRON HCL 4 MG/2ML IJ SOLN
4.0000 mg | Freq: Once | INTRAMUSCULAR | Status: AC
  Administered 2018-11-24: 4 mg via INTRAVENOUS
  Filled 2018-11-24: qty 2

## 2018-11-24 MED ORDER — SODIUM CHLORIDE 0.9 % IV SOLN
3.0000 g | Freq: Once | INTRAVENOUS | Status: AC
  Administered 2018-11-24: 3 g via INTRAVENOUS
  Filled 2018-11-24: qty 3

## 2018-11-24 MED ORDER — MORPHINE SULFATE (PF) 4 MG/ML IV SOLN
4.0000 mg | Freq: Once | INTRAVENOUS | Status: AC
  Administered 2018-11-24: 4 mg via INTRAVENOUS
  Filled 2018-11-24: qty 1

## 2018-11-24 NOTE — ED Triage Notes (Signed)
Pt presents to ED from urgent care for abd pain that started on Sunday and has progessively worsened.  Pt has had fevers and diarrhea and is very tender in the RLQ.  Some labs were completed at urgent care today.

## 2018-11-24 NOTE — ED Notes (Signed)
Pt transported to CT ?

## 2018-11-24 NOTE — ED Notes (Signed)
Pt back from CT

## 2018-11-24 NOTE — ED Provider Notes (Signed)
[facility] Provider Note   CSN: [id number] Arrival date & time: 11/24/18  1023     History   Chief Complaint Chief Complaint  Patient presents with  . Abdominal Pain    HPI Toni Lewis is a 53 y.o. female.  Patient c/o right lower abdominal pain for the past 3-4 days. States started 4 days ago with pain by umbilicus. Pain constant, dull, moderate-severe. Did have a few loose stools and decreased appetite.  In past day, fevers to 101, and pain is worse in RLQ. No vomiting. No dysuria or hematuria. No back/flank pain. No vaginal discharge or bleeding. States last menstrual period several yrs ago. Sent to ED from urgent care where patient had ua neg, upreg neg.   The history is provided by the patient.  Abdominal Pain  Associated symptoms: fever and nausea   Associated symptoms: no chest pain, no cough, no dysuria, no shortness of breath, no sore throat, no vaginal bleeding, no vaginal discharge and no vomiting     Past Medical History:  Diagnosis Date  . Anal fissure   . Endometriosis   . Hemorrhoids   . Hypertension   . Peptic ulcer disease     Patient Active Problem List   Diagnosis Date Noted  . Benign essential HTN 02/01/2014  . Chest pain 02/01/2014  . Syncope, vasovagal 02/01/2014  . Palpitations 02/01/2014    Past Surgical History:  Procedure Laterality Date  . ENDOMETRIAL ABLATION    . TONSILLECTOMY       OB History   No obstetric history on file.      Home Medications    Prior to Admission medications   Medication Sig Start Date End Date Taking? Authorizing Provider  albuterol (PROVENTIL HFA;VENTOLIN HFA) 108 (90 BASE) MCG/ACT inhaler Inhale 2 puffs into the lungs. 04/30/14   [provider]  amLODipine (NORVASC) 5 MG tablet Take 5 mg by mouth. 04/30/14 04/30/15  [provider]  cyclobenzaprine (FLEXERIL) 10 MG tablet Take 10 mg by mouth. 04/30/14   [provider]    HYDROcodone-homatropine (HYCODAN) 5-1.5 MG/5ML syrup Take 5 mLs by mouth every 6 (six) hours as needed for cough. 09/20/17   Mardella LaymanHagler, Brian, MD  LOSARTAN POTASSIUM PO Take by mouth.    [provider]  potassium chloride SA (K-DUR,KLOR-CON) 20 MEQ tablet Take 1 tablet (20 mEq total) by mouth 2 (two) times daily. 03/18/17 03/23/17  Mackuen, Courteney Lyn, MD  Pregabalin (LYRICA PO) Take by mouth.    [provider]    Family History Family History  Problem Relation Age of Onset  . Heart failure Mother   . Heart attack Mother   . Heart disease Mother   . Breast cancer Maternal Aunt     Social History Social History   Tobacco Use  . Smoking status: Never Smoker  . Smokeless tobacco: Never Used  Substance Use Topics  . Alcohol use: Yes    Comment: occ--twice a mnth   . Drug use: No     Allergies   Vicodin [hydrocodone-acetaminophen]   Review of Systems Review of Systems  Constitutional: Positive for fever.  HENT: Negative for sore throat.   Eyes: Negative for redness.  Respiratory: Negative for cough and shortness of breath.   Cardiovascular: Negative for chest pain.  Gastrointestinal: Positive for abdominal pain and nausea. Negative for vomiting.  Endocrine: Negative for polyuria.  Genitourinary: Negative for dysuria, flank pain, vaginal bleeding and vaginal discharge.  Musculoskeletal: Negative  for back pain and neck pain.  Skin: Negative for rash.  Neurological: Negative for headaches.  Hematological: Does not bruise/bleed easily.  Psychiatric/Behavioral: Negative for confusion.     Physical Exam Updated Vital Signs BP 118/74 (BP Location: Right Arm)   Pulse 90   Temp 100.3 F (37.9 C) (Oral)   Resp 20   Ht 1.676 m (5\' 6" )   Wt 108.9 kg   SpO2 99%   BMI 38.74 kg/m   Physical Exam Vitals signs and nursing note reviewed.  Constitutional:      Appearance: Normal appearance. She is well-developed.  HENT:     Head: Atraumatic.     Nose:  Nose normal.     Mouth/Throat:     Mouth: Mucous membranes are moist.  Eyes:     General: No scleral icterus.    Conjunctiva/sclera: Conjunctivae normal.     Pupils: Pupils are equal, round, and reactive to light.  Neck:     Musculoskeletal: Normal range of motion and neck supple. No neck rigidity or muscular tenderness.     Trachea: No tracheal deviation.  Cardiovascular:     Rate and Rhythm: Normal rate and regular rhythm.     Pulses: Normal pulses.     Heart sounds: Normal heart sounds. No murmur. No friction rub. No gallop.   Pulmonary:     Effort: Pulmonary effort is normal. No respiratory distress.     Breath sounds: Normal breath sounds.  Abdominal:     General: Bowel sounds are normal. There is no distension.     Palpations: Abdomen is soft.     Tenderness: There is abdominal tenderness.     Comments: +lower quadrant/suprapubic tenderness.  Genitourinary:    Comments: No cva tenderness.  Musculoskeletal:        General: No swelling.  Skin:    General: Skin is warm and dry.     Findings: No rash.  Neurological:     Mental Status: She is alert.     Comments: Alert, speech normal.   Psychiatric:        Mood and Affect: Mood normal.      ED Treatments / Results  Labs (all labs ordered are listed, but only abnormal results are displayed) Results for orders placed or performed during the hospital encounter of 11/24/18  Comprehensive metabolic panel  Result Value Ref Range   Sodium 140 135 - 145 mmol/L   Potassium 3.7 3.5 - 5.1 mmol/L   Chloride 97 (L) 98 - 111 mmol/L   CO2 28 22 - 32 mmol/L   Glucose, Bld 116 (H) 70 - 99 mg/dL   BUN 11 6 - 20 mg/dL   Creatinine, Ser 1.11 0.44 - 1.00 mg/dL   Calcium 9.4 8.9 - 55.2 mg/dL   Total Protein 7.3 6.5 - 8.1 g/dL   Albumin 3.8 3.5 - 5.0 g/dL   AST 29 15 - 41 U/L   ALT 34 0 - 44 U/L   Alkaline Phosphatase 42 38 - 126 U/L   Total Bilirubin 0.7 0.3 - 1.2 mg/dL   GFR calc non Af Amer >60 >60 mL/min   GFR calc Af Amer  >60 >60 mL/min   Anion gap 15 5 - 15  CBC  Result Value Ref Range   WBC 9.6 4.0 - 10.5 K/uL   RBC 4.64 3.87 - 5.11 MIL/uL   Hemoglobin 12.4 12.0 - 15.0 g/dL   HCT 08.0 22.3 - 36.1 %   MCV 84.5 80.0 - 100.0 fL  MCH 26.7 26.0 - 34.0 pg   MCHC 31.6 30.0 - 36.0 g/dL   RDW 54.2 70.6 - 23.7 %   Platelets 300 150 - 400 K/uL   nRBC 0.0 0.0 - 0.2 %    EKG None  Radiology Ct Abdomen Pelvis W Contrast  Result Date: 11/24/2018 CLINICAL DATA:  Abdominal pain, diarrhea and fever. EXAM: CT ABDOMEN AND PELVIS WITH CONTRAST TECHNIQUE: Multidetector CT imaging of the abdomen and pelvis was performed using the standard protocol following bolus administration of intravenous contrast. CONTRAST:  OMNIPAQUE IOHEXOL 300 MG/ML  SOLN COMPARISON:  None. FINDINGS: Lower chest: No acute abnormality. Hepatobiliary: Hepatic steatosis. Normal appearance of the gallbladder. Pancreas: Unremarkable. No pancreatic ductal dilatation or surrounding inflammatory changes. Spleen: Single splenic calcified granuloma, otherwise normal. Adrenals/Urinary Tract: Adrenal glands are unremarkable. Kidneys are normal, without renal calculi, focal lesion, or hydronephrosis. Bladder is unremarkable. Stomach/Bowel: Stomach is within normal limits. Appendix appears normal. No evidence of a small bowel wall thickening, distention, or inflammatory changes. Widespread colonic diverticulosis. Relatively long segment of mucosal thickening of the sigmoid colon with pericolonic inflammatory changes and an inflamed diverticulum within the mid sigmoid colon. Vascular/Lymphatic: No significant vascular findings are present. No enlarged abdominal or pelvic lymph nodes. Reproductive: Uterus and bilateral adnexa are unremarkable. Other: No abdominal wall hernia or abnormality. No abdominopelvic ascites. Musculoskeletal: Prior L5-S1 fusion with intact hardware. No suspicious osseous lesions. IMPRESSION: Relatively long segment of mucosal thickening and  pericolonic inflammatory changes in the sigmoid colon on the background of extensive diverticulosis, likely due to acute diverticulitis. No evidence of rupture or abscess formation. Colonic malignancy, although can not be entirely excluded, is deemed less likely. Correlation to colonoscopy, when clinically feasible may be considered. Hepatic steatosis. Electronically Signed   By: Ted Mcalpine M.D.   On: 11/24/2018 13:35    Procedures Procedures (including critical care time)  Medications Ordered in ED Medications  sodium chloride 0.9 % bolus 1,000 mL (1,000 mLs Intravenous New Bag/Given 11/24/18 1105)  morphine 4 MG/ML injection 4 mg (4 mg Intravenous Given 11/24/18 1108)  ondansetron (ZOFRAN) injection 4 mg (4 mg Intravenous Given 11/24/18 1108)     Initial Impression / Assessment and Plan / ED Course  I have reviewed the triage vital signs and the nursing notes.  Pertinent labs & imaging results that were available during my care of the patient were reviewed by me and considered in my medical decision making (see chart for details).  Iv ns bolus. Labs sent. Ct ordered.   Iv abx given.  Reviewed nursing notes and prior charts for additional history.   Labs reviewed - chem normal.  Ct reviewed - +diverticulitis.   Discussed results w pt.  Pt is feeling much improved. No nv.   Return precautions provided.   Final Clinical Impressions(s) / ED Diagnoses   Final diagnoses:  None    ED Discharge Orders    None       Cathren Laine, MD 11/24/18 1410

## 2018-11-24 NOTE — ED Provider Notes (Signed)
[facility]   [id number] 11/24/18 Arrival Time: 0850  ASSESSMENT & PLAN:  1. Right lower quadrant abdominal pain   2. Fever, unspecified    Given her worsening symptoms and amount of pain she appears to have, we are sending her to the ED for further evaluation. Declines EMS transport. Stable upon discharge.  Follow-up Information    Go to  [facility] EMERGENCY DEPARTMENT.   Specialty:  Emergency Medicine Contact information: [address] [id number] [place]he[place] [address] [phone]         Reviewed expectations re: course of current medical issues. Questions answered. Outlined signs and symptoms indicating need for more acute intervention. Patient verbalized understanding. After Visit Summary given.   SUBJECTIVE: History from: patient. Toni Lewis is a 53 y.o. female who presents with complaint of persistent lower abdominal pain. Started as epigastric discomfort about four days ago; with associated loose stools/diarrhea. Onset of nausea shortly after with continued dry heaves but without frank emesis. Over the past 24 hours she reports more lower and worsening abdominal pain; more RLQ. Small loose stool today without blood. Discomfort described as cramping and pressure-like; without radiation. Symptoms are gradually worsening since beginning. Fever: reports 101 degrees F last evening. Aggravating factors: have not been identified. Alleviating factors: have not been identified. Associated symptoms: fatigue. She denies arthralgias, belching, constipation, headache, myalgias and sweats. Appetite: decreased. PO intake: decreased. Ambulatory without assistance. Urinary symptoms: urinary frequency without dysuria or hematuria. Slight lightheadedness also mentioned. No vertigo. Normal vision. History of similar: no. OTC treatment: none reported.  No LMP recorded. Patient has had an ablation.   Past Surgical History:  Procedure  Laterality Date  . ENDOMETRIAL ABLATION    . TONSILLECTOMY     ROS: As per HPI. All other systems negative.  OBJECTIVE:  Vitals:   11/24/18 0939  BP: 116/86  Pulse: 92  Resp: 20  Temp: 100.1 F (37.8 C)  TempSrc: Temporal  SpO2: 99%    General appearance: alert and oriented; appears to be in pain; holding lower abdomen  Oropharynx: moist Lungs: clear to auscultation bilaterally; unlabored respirations Heart: regular rate and rhythm Abdomen: obese; soft; without distention; moderate tenderness over lower abdomen (R>L); she resists abdominal exam after initial palpation; present bowel sounds; without appreciable masses or organomegaly; without obvious guarding or rebound tenderness Back: without CVA tenderness; FROM at waist Extremities: without LE edema; symmetrical; without gross deformities Skin: warm and dry Neurologic: normal gait Psychological: alert and cooperative; normal mood and affect  Labs: Results for orders placed or performed during the hospital encounter of 11/24/18  POCT urinalysis dip (device)  Result Value Ref Range   Glucose, UA NEGATIVE NEGATIVE mg/dL   Bilirubin Urine NEGATIVE NEGATIVE   Ketones, ur NEGATIVE NEGATIVE mg/dL   Specific Gravity, Urine 1.025 1.005 - 1.030   Hgb urine dipstick NEGATIVE NEGATIVE   pH 5.5 5.0 - 8.0   Protein, ur NEGATIVE NEGATIVE mg/dL   Urobilinogen, UA 0.2 0.0 - 1.0 mg/dL   Nitrite NEGATIVE NEGATIVE   Leukocytes, UA NEGATIVE NEGATIVE  Pregnancy, urine POC  Result Value Ref Range   Preg Test, Ur NEGATIVE NEGATIVE   Labs Reviewed  POCT URINALYSIS DIP (DEVICE)  POCT PREGNANCY, URINE    Allergies  Allergen Reactions  . Vicodin [Hydrocodone-Acetaminophen]     Nausea and vomiting  Past Medical History:  Diagnosis Date  . Anal fissure   . Endometriosis   . Hemorrhoids   . Hypertension   . Peptic ulcer disease    Social History   Socioeconomic History  . Marital  status: Single    Spouse name: Not on file  . Number of children: Not on file  . Years of education: Not on file  . Highest education level: Not on file  Occupational History  . Occupation: [employer]     Comment: Public librarianDriver   Social Needs  . Financial resource strain: Not on file  . Food insecurity:    Worry: Not on file    Inability: Not on file  . Transportation needs:    Medical: Not on file    Non-medical: Not on file  Tobacco Use  . Smoking status: Never Smoker  . Smokeless tobacco: Never Used  Substance and Sexual Activity  . Alcohol use: Yes    Comment: occ--twice a mnth   . Drug use: No  . Sexual activity: Never  Lifestyle  . Physical activity:    Days per week: Not on file    Minutes per session: Not on file  . Stress: Not on file  Relationships  . Social connections:    Talks on phone: Not on file    Gets together: Not on file    Attends religious service: Not on file    Active member of club or organization: Not on file    Attends meetings of clubs or organizations: Not on file    Relationship status: Not on file  . Intimate partner violence:    Fear of current or ex partner: Not on file    Emotionally abused: Not on file    Physically abused: Not on file    Forced sexual activity: Not on file  Other Topics Concern  . Not on file  Social History Narrative  . Not on file   Family History  Problem Relation Age of Onset  . Heart failure Mother   . Heart attack Mother   . Heart disease Mother   . Breast cancer Maternal Renato GailsAunt      Judd Mccubbin, MD 11/24/18 1026

## 2018-11-24 NOTE — ED Notes (Signed)
Patient verbalized understanding of discharge instructions and denies any further needs or questions at this time. VS stable. Patient ambulatory with steady gait. Assisted to ED lobby in wheelchair.   

## 2018-11-24 NOTE — ED Triage Notes (Signed)
Lower abdominal pain started on Monday.  Fever 101 last night.  No unusual back pain-chronic pain.  Denies burning with urination.  Urinating more often.  Patient is having diarrhea since Sunday.  Diarrhea episodes today-2 episodes.    Last night had nausea and lightheaded

## 2018-11-24 NOTE — ED Notes (Signed)
MD at bedside. 

## 2018-11-24 NOTE — Discharge Instructions (Addendum)
It was our pleasure to provide your ER care today - we hope that you feel better.  Take antibiotic as prescribed (augmentin).   Take acetaminophen as need for pain. In addition, take ibuprofen or naprosyn as need for pain.  Take zofran as need for nausea.   Your CT scan was read as follows: Relatively long segment of mucosal thickening and pericolonic inflammatory changes in the sigmoid colon on the background of extensive diverticulosis, likely due to acute diverticulitis. No evidence of rupture or abscess formation. Colonic malignancy,although can not be entirely excluded, is deemed less likely. Correlation to colonoscopy, when clinically feasible.  Follow up with primary care doctor in the coming week for recheck.   Also, for diverticulitis, follow up with GI doctor in the next few weeks - have them reviewed your CT results - discuss possible colonoscopy then.   Return to ER if worse, new symptoms, worsening/severe pain, persistent vomiting, other concern.   You were given pain medication in the ER - no driving for the next 4 hours.

## 2018-12-05 ENCOUNTER — Other Ambulatory Visit: Payer: Self-pay | Admitting: Family Medicine

## 2018-12-05 DIAGNOSIS — Z1231 Encounter for screening mammogram for malignant neoplasm of breast: Secondary | ICD-10-CM

## 2019-09-30 ENCOUNTER — Ambulatory Visit (HOSPITAL_COMMUNITY)
Admission: EM | Admit: 2019-09-30 | Discharge: 2019-09-30 | Disposition: A | Discharge status: Home / Self Care | Payer: BLUE CROSS/BLUE SHIELD | Attending: Family | Admitting: Family

## 2019-09-30 ENCOUNTER — Encounter (HOSPITAL_COMMUNITY): Payer: Self-pay

## 2019-09-30 ENCOUNTER — Other Ambulatory Visit: Payer: Self-pay

## 2019-09-30 DIAGNOSIS — R6883 Chills (without fever): Secondary | ICD-10-CM | POA: Diagnosis not present

## 2019-09-30 DIAGNOSIS — R432 Parageusia: Secondary | ICD-10-CM | POA: Diagnosis not present

## 2019-09-30 DIAGNOSIS — R05 Cough: Secondary | ICD-10-CM

## 2019-09-30 DIAGNOSIS — J069 Acute upper respiratory infection, unspecified: Secondary | ICD-10-CM

## 2019-09-30 DIAGNOSIS — U071 COVID-19: Secondary | ICD-10-CM | POA: Diagnosis not present

## 2019-09-30 DIAGNOSIS — R059 Cough, unspecified: Secondary | ICD-10-CM

## 2019-09-30 LAB — POC SARS CORONAVIRUS 2 AG: SARS Coronavirus 2 Ag: POSITIVE — AB

## 2019-09-30 LAB — POC SARS CORONAVIRUS 2 AG -  ED
SARS Coronavirus 2 Ag: POSITIVE — AB
SARS Coronavirus 2 Ag: POSITIVE — AB

## 2019-09-30 MED ORDER — PREDNISONE 10 MG (21) PO TBPK: ORAL_TABLET | Freq: Every day | ORAL | 0 refills | Status: DC

## 2019-09-30 MED ORDER — ALBUTEROL SULFATE HFA 108 (90 BASE) MCG/ACT IN AERS: 2.0000 | INHALATION_SPRAY | Freq: Four times a day (QID) | RESPIRATORY_TRACT | 1 refills | Status: DC | PRN

## 2019-09-30 NOTE — ED Provider Notes (Signed)
[place]    CSN: [id number] Arrival date & time: 09/30/19  1110      History   Chief Complaint Chief Complaint  Patient presents with  . Cough  . Shortness of Breath  . Loss of taste    HPI Toni Lewis is a 53 y.o. female.   53 year old female presents with cough for over 2 weeks. Also has had some nasal congestion and body aches. Then a few days ago started developing a low grade fever, more congestion, shortness of breath, nausea, diarrhea and loss of taste and smell. Has been taking Theraflu and Nyquil with minimal relief. Was together with her niece and mom at Thanksgiving (over 2 weeks ago). Niece was positive for COVID 19 a few days after Thanksgiving. Mom and Patient got tested at [facility] on 09/21/2019 and were negative. 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 She took her mom to the ER this morning and her mom tested positive for COVID 19 and is being transferred to [facility]. She is concerned she may have COVID 19 and requests rapid test. Works for [employer]. Other chronic health issues include HTN, chronic back pain and endometriosis and currently on Hyzaar, Lyrica, Amitriptyline, and Diclofenac daily.   The history is provided by the patient.    Past Medical History:  Diagnosis Date  . Anal fissure   . Endometriosis   . Hemorrhoids   . Hypertension   . Peptic ulcer disease     Patient Active Problem List   Diagnosis Date Noted  . Benign essential HTN 02/01/2014  . Chest pain 02/01/2014  . Syncope, vasovagal 02/01/2014  . Palpitations 02/01/2014    Past Surgical History:  Procedure Laterality Date  . ENDOMETRIAL ABLATION    . TONSILLECTOMY      OB History   No obstetric history on file.      Home Medications    Prior to Admission medications   Medication Sig Start Date End Date Taking? Authorizing Provider  AMITRIPTYLINE HCL PO Take by mouth.   Yes [provider]   diclofenac (VOLTAREN) 75 MG EC tablet Take 75 mg by mouth 2 (two) times daily.   Yes [provider]  albuterol (VENTOLIN HFA) 108 (90 Base) MCG/ACT inhaler Inhale 2 puffs into the lungs every 6 (six) hours as needed for wheezing or shortness of breath. 09/30/19   Katy Apo, NP  ELDERBERRY PO Take 30 mLs by mouth 2 (two) times daily.    [provider]  losartan-hydrochlorothiazide (HYZAAR) 50-12.5 MG tablet Take 1 tablet by mouth daily.    [provider]  predniSONE (STERAPRED UNI-PAK 21 TAB) 10 MG (21) TBPK tablet Take by mouth daily. Take 6 tabs by mouth today then decrease by 1 tablet each day until finished on day 6. 09/30/19   Katy Apo, NP  pregabalin (LYRICA) 50 MG capsule Take 50 mg by mouth 2 (two) times daily. 10/26/18   [provider]  potassium chloride SA (K-DUR,KLOR-CON) 20 MEQ tablet Take 1 tablet (20 mEq total) by mouth 2 (two) times daily. Patient not taking: Reported on 11/24/2018 03/18/17 09/30/19  Macarthur Critchley, MD    Family History Family History  Problem Relation Age of Onset  . Heart failure Mother   . Heart attack Mother   . Heart disease Mother   . Breast cancer Maternal Aunt     Social History Social History  Tobacco Use  . Smoking status: Never Smoker  . Smokeless tobacco: Never Used  Substance Use Topics  . Alcohol use: Yes    Comment: occ--twice a mnth   . Drug use: No     Allergies   Vicodin [hydrocodone-acetaminophen]   Review of Systems Review of Systems  Constitutional: Positive for activity change, appetite change, chills, fatigue and fever. Negative for diaphoresis.  HENT: Positive for congestion, postnasal drip, rhinorrhea, sinus pressure and sinus pain. Negative for ear discharge, ear pain, facial swelling, mouth sores, nosebleeds, sore throat and trouble swallowing.   Eyes: Negative for pain, discharge, redness and itching.  Respiratory: Positive for cough, chest tightness and  shortness of breath. Negative for wheezing.   Cardiovascular: Negative for chest pain and palpitations.  Gastrointestinal: Positive for diarrhea and nausea. Negative for abdominal pain and vomiting.  Musculoskeletal: Positive for arthralgias, back pain (chronic) and myalgias. Negative for neck pain and neck stiffness.  Skin: Negative for color change, rash and wound.  Allergic/Immunologic: Negative for immunocompromised state.  Neurological: Positive for light-headedness and headaches. Negative for dizziness, tremors, seizures, syncope, weakness and numbness.  Hematological: Negative for adenopathy. Does not bruise/bleed easily.     Physical Exam Triage Vital Signs ED Triage Vitals  Enc Vitals Group     BP 09/30/19 1254 (!) 141/102     Pulse Rate 09/30/19 1254 (!) 117     Resp 09/30/19 1254 18     Temp 09/30/19 1254 98.8 F (37.1 C)     Temp src --      SpO2 09/30/19 1254 100 %     Weight --      Height --      Head Circumference --      Peak Flow --      Pain Score 09/30/19 1257 0     Pain Loc --      Pain Edu? --      Excl. in GC? --    No data found.  Updated Vital Signs BP (!) 141/102 (BP Location: Left Arm)   Pulse (!) 117   Temp 98.8 F (37.1 C)   Resp 18   SpO2 100%   Visual Acuity Right Eye Distance:   Left Eye Distance:   Bilateral Distance:    Right Eye Near:   Left Eye Near:    Bilateral Near:     Physical Exam Vitals and nursing note reviewed.  Constitutional:      General: She is awake. She is not in acute distress.    Appearance: She is well-developed and well-groomed. She is ill-appearing.     Comments: Patient sitting comfortably in exam chair in no acute distress but appears ill.   HENT:     Head: Normocephalic and atraumatic.     Right Ear: Hearing, tympanic membrane, ear canal and external ear normal.     Left Ear: Hearing, tympanic membrane, ear canal and external ear normal.     Nose: Mucosal edema and congestion present.     Right  Sinus: Maxillary sinus tenderness and frontal sinus tenderness present.     Left Sinus: Maxillary sinus tenderness and frontal sinus tenderness present.     Mouth/Throat:     Lips: Pink.     Mouth: Mucous membranes are moist.     Pharynx: Uvula midline. Posterior oropharyngeal erythema present. No pharyngeal swelling, oropharyngeal exudate or uvula swelling.  Eyes:     Extraocular Movements: Extraocular movements intact.     Conjunctiva/sclera: Conjunctivae normal.  Cardiovascular:     Rate and Rhythm: Regular rhythm. Tachycardia present.     Pulses: Normal pulses.     Heart sounds: Normal heart sounds. No murmur.  Pulmonary:     Effort: Pulmonary effort is normal. No respiratory distress.     Breath sounds: Normal air entry. Examination of the right-upper field reveals decreased breath sounds and wheezing. Examination of the left-upper field reveals decreased breath sounds and wheezing. Examination of the right-middle field reveals decreased breath sounds. Examination of the right-lower field reveals decreased breath sounds. Examination of the left-lower field reveals decreased breath sounds. Decreased breath sounds and wheezing (mild) present. No rhonchi or rales.  Musculoskeletal:        General: Normal range of motion.     Cervical back: Normal range of motion and neck supple. No rigidity or tenderness.  Lymphadenopathy:     Cervical: No cervical adenopathy.  Skin:    General: Skin is warm and dry.     Capillary Refill: Capillary refill takes less than 2 seconds.     Findings: No rash.  Neurological:     General: No focal deficit present.     Mental Status: She is alert and oriented to person, place, and time.  Psychiatric:        Mood and Affect: Mood normal.        Behavior: Behavior normal. Behavior is cooperative.        Thought Content: Thought content normal.        Judgment: Judgment normal.      UC Treatments / Results  Labs (all labs ordered are listed, but only  abnormal results are displayed) Labs Reviewed  POC SARS CORONAVIRUS 2 AG -  ED - Abnormal; Notable for the following components:      Result Value   SARS Coronavirus 2 Ag POSITIVE (*)    All other components within normal limits  POC SARS CORONAVIRUS 2 AG - Abnormal; Notable for the following components:   SARS Coronavirus 2 Ag POSITIVE (*)    All other components within normal limits  POC SARS CORONAVIRUS 2 AG -  ED - Abnormal; Notable for the following components:   SARS Coronavirus 2 Ag POSITIVE (*)    All other components within normal limits    EKG   Radiology No results found.  Procedures Procedures (including critical care time)  Medications Ordered in UC Medications - No data to display  Initial Impression / Assessment and Plan / UC Course  I have reviewed the triage vital signs and the nursing notes.  Pertinent labs & imaging results that were available during my care of the patient were reviewed by me and considered in my medical decision making (see chart for details).    Reviewed positive rapid COVID 19 test result with patient. Due to slight wheezing, cough and shortness of breath, will start Albuterol inhaler 2 puffs every 4 to 6 hours as needed. Also since history of frequent bronchitis, recommend Prednisone  6 day dose pack as directed. Continue to push fluids to stay hydrated. May continue Nyquil as needed for cough. May take Tylenol  every 8 hours as needed for fever and body aches. Continue other routine medications. Rest. Stay at home. Note written for work. Continue to monitor symptoms. Currently stable with good SpO2 levels and minimal lung changes on exam. If cough and shortness of breath worsen or pulse Ox drops to 94% or lower (patient has a pulse Ox at home), go to the ER  ASAP. Otherwise, follow-up with your PCP in 3 to 4 days if minimal improvement.  Final Clinical Impressions(s) / UC Diagnoses   Final diagnoses:  Cough  Acute respiratory  disease due to COVID-19 virus  Chills  Loss of taste     Discharge Instructions     Recommend start Albuterol inhaler 2 puffs every 4 to 6 hours as needed for cough or shortness of breath. Also start Prednisone 10mg  tablets- take 6 tablets today then decrease by 1 tablet each day until finished. Increase fluids to help stay hydrated. May take OTC Mucinex DM as needed for cough. May continue Ibuprofen 600mg  or Tylenol 1000mg  every 8 hours as needed for body aches and fever. Rest. Stay at home! Follow-up with your PCP in 3 to 4 days if minimal improvement or if symptoms worsen, go to the ER for further treatment.     ED Prescriptions    Medication Sig Dispense Auth. Provider   albuterol (VENTOLIN HFA) 108 (90 Base) MCG/ACT inhaler Inhale 2 puffs into the lungs every 6 (six) hours as needed for wheezing or shortness of breath. 18 g Sudie GrumblingAmyot, Chandlar Staebell Berry, NP   predniSONE (STERAPRED UNI-PAK 21 TAB) 10 MG (21) TBPK tablet Take by mouth daily. Take 6 tabs by mouth today then decrease by 1 tablet each day until finished on day 6. 21 tablet Lizeth Bencosme, Ali LoweAnn Berry, NP     PDMP not reviewed this encounter.   Sudie GrumblingAmyot, Linday Rhodes Berry, NP 09/30/19 2314

## 2019-09-30 NOTE — Discharge Instructions (Addendum)
Recommend start Albuterol inhaler 2 puffs every 4 to 6 hours as needed for cough or shortness of breath. Also start Prednisone 10mg  tablets- take 6 tablets today then decrease by 1 tablet each day until finished. Increase fluids to help stay hydrated. May take OTC Mucinex DM as needed for cough. May continue Ibuprofen 600mg  or Tylenol 1000mg  every 8 hours as needed for body aches and fever. Rest. Stay at home! Follow-up with your PCP in 3 to 4 days if minimal improvement or if symptoms worsen, go to the ER for further treatment.

## 2019-09-30 NOTE — ED Triage Notes (Signed)
Pt present sob, cough and loss of taste. Symptoms started a 3 weeks ago. Pt has been tested for covid on 09/21/2019 with [facility]  Test results were negative on this day.  There has not been any change with the patient symptoms, they have gotten worst.

## 2020-01-15 ENCOUNTER — Other Ambulatory Visit: Payer: Self-pay

## 2020-01-15 ENCOUNTER — Emergency Department (HOSPITAL_COMMUNITY)
Admission: EM | Admit: 2020-01-15 | Discharge: 2020-01-15 | Disposition: A | Discharge status: Home / Self Care | Payer: BC Managed Care – PPO | Attending: Emergency Medicine | Admitting: Emergency Medicine

## 2020-01-15 ENCOUNTER — Emergency Department (HOSPITAL_COMMUNITY): Payer: BC Managed Care – PPO

## 2020-01-15 ENCOUNTER — Encounter (HOSPITAL_COMMUNITY): Payer: Self-pay | Admitting: *Deleted

## 2020-01-15 DIAGNOSIS — I10 Essential (primary) hypertension: Secondary | ICD-10-CM | POA: Insufficient documentation

## 2020-01-15 DIAGNOSIS — Y281XXA Contact with knife, undetermined intent, initial encounter: Secondary | ICD-10-CM | POA: Diagnosis not present

## 2020-01-15 DIAGNOSIS — Y999 Unspecified external cause status: Secondary | ICD-10-CM | POA: Insufficient documentation

## 2020-01-15 DIAGNOSIS — Y929 Unspecified place or not applicable: Secondary | ICD-10-CM | POA: Diagnosis not present

## 2020-01-15 DIAGNOSIS — Z79899 Other long term (current) drug therapy: Secondary | ICD-10-CM | POA: Insufficient documentation

## 2020-01-15 DIAGNOSIS — Z23 Encounter for immunization: Secondary | ICD-10-CM | POA: Insufficient documentation

## 2020-01-15 DIAGNOSIS — S61216A Laceration without foreign body of right little finger without damage to nail, initial encounter: Secondary | ICD-10-CM | POA: Diagnosis present

## 2020-01-15 DIAGNOSIS — Y939 Activity, unspecified: Secondary | ICD-10-CM | POA: Diagnosis not present

## 2020-01-15 MED ORDER — LIDOCAINE HCL (PF) 1 % IJ SOLN
5.0000 mL | Freq: Once | INTRAMUSCULAR | Status: AC
  Administered 2020-01-15: 18:00:00 5 mL
  Filled 2020-01-15: qty 5

## 2020-01-15 MED ORDER — TETANUS-DIPHTH-ACELL PERTUSSIS 5-2.5-18.5 LF-MCG/0.5 IM SUSP
0.5000 mL | Freq: Once | INTRAMUSCULAR | Status: AC
  Administered 2020-01-15: 0.5 mL via INTRAMUSCULAR
  Filled 2020-01-15: qty 0.5

## 2020-01-15 NOTE — Discharge Instructions (Addendum)
Please keep splint on until the sutures are removed. You will need to have the sutures removed in 5-7 days time. You may return to the ED or your PCP for suture removal.   Keep wound clean and dry. You may take Ibuprofen and Tylenol as needed for pain.   Return to the ED IMMEDIATELY for any signs of infection to the wound including redness/swelling around the wound, drainage of pus, fevers > 100.4, or chills.

## 2020-01-15 NOTE — ED Provider Notes (Signed)
[facility] EMERGENCY DEPARTMENT Provider Note   CSN: [id number] Arrival date & time: 01/15/20  1346     History Chief Complaint  Patient presents with  . Laceration    Toni Lewis is a 54 y.o. female who presents to the ED for sustaining laceration to right little finger at work.  Patient works for [employer] and was opening a package when she accidentally sliced her finger with a knife.  She is unsure of her tetanus status.  Leading is controlled on arrival.  She complains of mild pain to the area.  No other complaints at this time.  The history is provided by the patient and medical records.       Past Medical History:  Diagnosis Date  . Anal fissure   . Endometriosis   . Hemorrhoids   . Hypertension   . Peptic ulcer disease     Patient Active Problem List   Diagnosis Date Noted  . Benign essential HTN 02/01/2014  . Chest pain 02/01/2014  . Syncope, vasovagal 02/01/2014  . Palpitations 02/01/2014    Past Surgical History:  Procedure Laterality Date  . ENDOMETRIAL ABLATION    . TONSILLECTOMY       OB History   No obstetric history on file.     Family History  Problem Relation Age of Onset  . Heart failure Mother   . Heart attack Mother   . Heart disease Mother   . Breast cancer Maternal Aunt     Social History   Tobacco Use  . Smoking status: Never Smoker  . Smokeless tobacco: Never Used  Substance Use Topics  . Alcohol use: Yes    Comment: occ--twice a mnth   . Drug use: No    Home Medications Prior to Admission medications   Medication Sig Start Date End Date Taking? Authorizing Provider  albuterol (VENTOLIN HFA) 108 (90 Base) MCG/ACT inhaler Inhale 2 puffs into the lungs every 6 (six) hours as needed for wheezing or shortness of breath. 09/30/19   Sudie Grumbling, NP  AMITRIPTYLINE HCL PO Take by mouth.    [provider]  diclofenac (VOLTAREN) 75 MG EC tablet Take 75 mg by mouth 2 (two) times daily.    [provider]  ELDERBERRY PO Take 30 mLs by mouth 2 (two) times daily.    [provider]  losartan-hydrochlorothiazide (HYZAAR) 50-12.5 MG tablet Take 1 tablet by mouth daily.    [provider]  predniSONE (STERAPRED UNI-PAK 21 TAB) 10 MG (21) TBPK tablet Take by mouth daily. Take 6 tabs by mouth today then decrease by 1 tablet each day until finished on day 6. 09/30/19   Sudie Grumbling, NP  pregabalin (LYRICA) 50 MG capsule Take 50 mg by mouth 2 (two) times daily. 10/26/18   [provider]  potassium chloride SA (K-DUR,KLOR-CON) 20 MEQ tablet Take 1 tablet (20 mEq total) by mouth 2 (two) times daily. Patient not taking: Reported on 11/24/2018 03/18/17 09/30/19  Abelino Derrick, MD    Allergies    Vicodin [hydrocodone-acetaminophen]  Review of Systems   Review of Systems  Constitutional: Negative for chills and fever.  Musculoskeletal: Positive for arthralgias.  Skin: Positive for wound.    Physical Exam Updated Vital Signs BP (!) 137/101 (BP Location: Right Arm)   Pulse (!) 107   Temp 98.5 F (36.9 C) (Oral)   Resp 17   Ht 5\' 6"  (1.676 m)   Wt 113.4 kg  SpO2 100%   BMI 40.35 kg/m   Physical Exam Vitals and nursing note reviewed.  Constitutional:      Appearance: She is not ill-appearing.  HENT:     Head: Normocephalic and atraumatic.  Eyes:     Conjunctiva/sclera: Conjunctivae normal.  Cardiovascular:     Rate and Rhythm: Normal rate and regular rhythm.     Pulses: Normal pulses.  Pulmonary:     Effort: Pulmonary effort is normal.     Breath sounds: Normal breath sounds. No wheezing, rhonchi or rales.  Musculoskeletal:     Comments: 2.5 cm laceration over the PIP joint of the right fifth digit.  Tenderness to palpation.  Range of motion intact to the MCP, PIP, DIP joint of same finger.  Cap refill less than 2 seconds.  2+ radial pulse.  Skin:    General: Skin is warm and dry.     Coloration: Skin is not jaundiced.  Neurological:      Mental Status: She is alert.     ED Results / Procedures / Treatments   Labs (all labs ordered are listed, but only abnormal results are displayed) Labs Reviewed - No data to display  EKG None  Radiology DG Finger Little Left  Result Date: 01/15/2020 CLINICAL DATA:  Laceration of the left little finger. EXAM: LEFT LITTLE FINGER 2+V COMPARISON:  None. FINDINGS: There is no fracture or dislocation or radiodense foreign body in the soft tissues. Minimal arthritic changes of the IP joints of the little finger. IMPRESSION: No acute abnormality. Electronically Signed   By: Francene Boyers M.D.   On: 01/15/2020 14:53    Procedures .Marland KitchenLaceration Repair  Date/Time: 01/15/2020 6:50 PM Performed by: Tanda Rockers, PA-C Authorized by: Tanda Rockers, PA-C   Consent:    Consent obtained:  Verbal   Consent given by:  Patient   Risks discussed:  Infection, need for additional repair, pain, poor cosmetic result and poor wound healing   Alternatives discussed:  No treatment and delayed treatment Universal protocol:    Procedure explained and questions answered to patient or proxy's satisfaction: yes     Relevant documents present and verified: yes     Test results available and properly labeled: yes     Imaging studies available: yes     Required blood products, implants, devices, and special equipment available: yes     Site/side marked: yes     Immediately prior to procedure, a time out was called: yes     Patient identity confirmed:  Verbally with patient Anesthesia (see MAR for exact dosages):    Anesthesia method:  Local infiltration   Local anesthetic:  Lidocaine 1% w/o epi Laceration details:    Location:  Finger   Finger location:  R small finger   Length (cm):  2.5   Depth (mm):  2 Repair type:    Repair type:  Intermediate Pre-procedure details:    Preparation:  Patient was prepped and draped in usual sterile fashion Exploration:    Hemostasis achieved with:  Direct  pressure   Wound exploration: wound explored through full range of motion and entire depth of wound probed and visualized   Treatment:    Area cleansed with:  Betadine   Irrigation solution:  Sterile saline Skin repair:    Repair method:  Sutures   Suture size:  4-0   Suture material:  Prolene   Suture technique:  Simple interrupted   Number of sutures:  8 Approximation:    Approximation:  Close Post-procedure details:    Dressing:  Splint for protection and non-adherent dressing   Patient tolerance of procedure:  Tolerated well, no immediate complications   (including critical care time)  Medications Ordered in ED Medications  Tdap (BOOSTRIX) injection 0.5 mL (0.5 mLs Intramuscular Given 01/15/20 1816)  lidocaine (PF) (XYLOCAINE) 1 % injection 5 mL (5 mLs Infiltration Given 01/15/20 1817)    ED Course  I have reviewed the triage vital signs and the nursing notes.  Pertinent labs & imaging results that were available during my care of the patient were reviewed by me and considered in my medical decision making (see chart for details).  54 year old female presents to the ED today after sustaining a laceration to her right little finger while at work.  Tetanus is updated in the ED.  An x-ray was obtained to ensure there were no foreign bodies.  No fractures or foreign bodies detected.  8 sutures placed to patient's finger in splint applied.  Patient advised to return to the ED in 5 to 7 days for suture removal.  Strict return precautions discussed including signs of infection.  Patient is in agreement with plan is stable for discharge home.  This note was prepared using Dragon voice recognition software and may include unintentional dictation errors due to the inherent limitations of voice recognition software.    MDM Rules/Calculators/A&P                       Final Clinical Impression(s) / ED Diagnoses Final diagnoses:  Laceration of right little finger without foreign body  without damage to nail, initial encounter    Rx / DC Orders ED Discharge Orders    None       Discharge Instructions     Please keep splint on until the sutures are removed. You will need to have the sutures removed in 5-7 days time. You may return to the ED or your PCP for suture removal.   Keep wound clean and dry. You may take Ibuprofen and Tylenol as needed for pain.   Return to the ED IMMEDIATELY for any signs of infection to the wound including redness/swelling around the wound, drainage of pus, fevers > 100.4, or chills.        Eustaquio Maize, PA-C 01/15/20 1854    Lucrezia Starch, MD 01/16/20 [phone]

## 2020-01-15 NOTE — ED Triage Notes (Addendum)
Pt reports trying to cut a box tie and now has approx 1 inch laceration to left little finger. Bleeding controlled. Unsure about last tetanus.

## 2020-06-13 ENCOUNTER — Ambulatory Visit (HOSPITAL_COMMUNITY)
Admission: EM | Admit: 2020-06-13 | Discharge: 2020-06-13 | Disposition: A | Discharge status: Home / Self Care | Payer: BC Managed Care – PPO | Attending: Family Medicine | Admitting: Family Medicine

## 2020-06-13 ENCOUNTER — Encounter (HOSPITAL_COMMUNITY): Payer: Self-pay

## 2020-06-13 ENCOUNTER — Other Ambulatory Visit: Payer: Self-pay

## 2020-06-13 DIAGNOSIS — Z20822 Contact with and (suspected) exposure to covid-19: Secondary | ICD-10-CM | POA: Diagnosis not present

## 2020-06-13 DIAGNOSIS — J069 Acute upper respiratory infection, unspecified: Secondary | ICD-10-CM | POA: Diagnosis not present

## 2020-06-13 LAB — SARS CORONAVIRUS 2 (TAT 6-24 HRS): SARS Coronavirus 2: NEGATIVE

## 2020-06-13 MED ORDER — BENZONATATE 200 MG PO CAPS: 200.0000 mg | ORAL_CAPSULE | Freq: Two times a day (BID) | ORAL | 0 refills | Status: DC | PRN

## 2020-06-13 MED ORDER — PREDNISONE 20 MG PO TABS: 20.0000 mg | ORAL_TABLET | Freq: Two times a day (BID) | ORAL | 0 refills | Status: DC

## 2020-06-13 NOTE — Discharge Instructions (Addendum)
Take prednisone 2 times a day for 5 days.  Take 2 doses today Take Tessalon 2-3 times a day for cough. Drink plenty of liquids Rest Quarantine at home until your Covid test is available

## 2020-06-13 NOTE — ED Provider Notes (Signed)
[facility]    CSN: [id number] Arrival date & time: 06/13/20  0623      History   Chief Complaint Chief Complaint  Patient presents with  . Cough  . Nasal Congestion    HPI Toni Lewis is a 54 y.o. female.   HPI  Patient is here for an upper respiratory infection.  She has had cough runny nose fatigue body aches and headache for the last almost week.  She is starting to produce some sputum.  She states that she still has taste and smell.  Some runny and stuffy nose.  No fever or chills.  No nausea or vomiting. Patient states she did have exposure to Covid. Patient works for [employer] and needs a Covid test before she returns to work. She has been taking over-the-counter medications, but still has coughing spells that are hard to control and chest pain centrally when she has these coughing episodes. States her mother is also sick, came in yesterday for testing.  Results not yet available  Past Medical History:  Diagnosis Date  . Anal fissure   . Endometriosis   . Hemorrhoids   . Hypertension   . Peptic ulcer disease     Patient Active Problem List   Diagnosis Date Noted  . Benign essential HTN 02/01/2014  . Chest pain 02/01/2014  . Syncope, vasovagal 02/01/2014  . Palpitations 02/01/2014    Past Surgical History:  Procedure Laterality Date  . ENDOMETRIAL ABLATION    . TONSILLECTOMY      OB History   No obstetric history on file.      Home Medications    Prior to Admission medications   Medication Sig Start Date End Date Taking? Authorizing Provider  AMITRIPTYLINE HCL PO Take by mouth.    [provider]  benzonatate (TESSALON) 200 MG capsule Take 1 capsule (200 mg total) by mouth 2 (two) times daily as needed for cough. 06/13/20   Eustace Moore, MD  diclofenac (VOLTAREN) 75 MG EC tablet Take 75 mg by mouth 2 (two) times daily.    [provider]  ELDERBERRY PO Take 30 mLs by mouth 2 (two) times daily.    [provider]  losartan-hydrochlorothiazide (HYZAAR) 50-12.5 MG tablet Take 1 tablet by mouth daily.    [provider]  predniSONE (DELTASONE) 20 MG tablet Take 1 tablet (20 mg total) by mouth 2 (two) times daily with a meal. 06/13/20   Eustace Moore, MD  pregabalin (LYRICA) 50 MG capsule Take 50 mg by mouth 2 (two) times daily. 10/26/18   [provider]  albuterol (VENTOLIN HFA) 108 (90 Base) MCG/ACT inhaler Inhale 2 puffs into the lungs every 6 (six) hours as needed for wheezing or shortness of breath. 09/30/19 06/13/20  Sudie Grumbling, NP  potassium chloride SA (K-DUR,KLOR-CON) 20 MEQ tablet Take 1 tablet (20 mEq total) by mouth 2 (two) times daily. Patient not taking: Reported on 11/24/2018 03/18/17 09/30/19  Abelino Derrick, MD    Family History Family History  Problem Relation Age of Onset  . Heart failure Mother   . Heart attack Mother   . Heart disease Mother   . Breast cancer Maternal Aunt     Social History Social History   Tobacco Use  . Smoking status: Never Smoker  . Smokeless tobacco: Never Used  Substance Use Topics  . Alcohol use: Yes    Comment: occ--twice a mnth   . Drug use: No  Allergies   Vicodin [hydrocodone-acetaminophen]   Review of Systems Review of Systems See HPI Physical Exam Triage Vital Signs ED Triage Vitals  Enc Vitals Group     BP 06/13/20 1035 130/87     Pulse Rate 06/13/20 1035 88     Resp 06/13/20 1035 16     Temp 06/13/20 1035 98.4 F (36.9 C)     Temp Source 06/13/20 1035 Oral     SpO2 06/13/20 1035 100 %     Weight --      Height --      Head Circumference --      Peak Flow --      Pain Score 06/13/20 1036 5     Pain Loc --      Pain Edu? --      Excl. in GC? --    No data found.  Updated Vital Signs BP 130/87 (BP Location: Left Arm)   Pulse 88   Temp 98.4 F (36.9 C) (Oral)   Resp 16   SpO2 100%      Physical Exam Constitutional:      General: She is not in acute distress.    Appearance:  She is well-developed.  HENT:     Head: Normocephalic and atraumatic.     Nose: Congestion present.     Comments: Mask is in place.  Mild posterior pharyngeal erythema.    Mouth/Throat:     Mouth: Mucous membranes are moist.     Pharynx: Posterior oropharyngeal erythema present.  Eyes:     Conjunctiva/sclera: Conjunctivae normal.     Pupils: Pupils are equal, round, and reactive to light.  Cardiovascular:     Rate and Rhythm: Normal rate and regular rhythm.  Pulmonary:     Effort: Pulmonary effort is normal. No respiratory distress.     Comments: Decreased breath sounds.  No rales or rhonchi identified. Abdominal:     Palpations: Abdomen is soft.  Musculoskeletal:        General: Normal range of motion.     Cervical back: Normal range of motion.  Skin:    General: Skin is warm and dry.  Neurological:     Mental Status: She is alert.     Gait: Gait normal.  Psychiatric:        Mood and Affect: Mood normal.        Behavior: Behavior normal.      UC Treatments / Results  Labs (all labs ordered are listed, but only abnormal results are displayed) Labs Reviewed  SARS CORONAVIRUS 2 (TAT 6-24 HRS)    EKG   Radiology No results found.  Procedures Procedures (including critical care time)  Medications Ordered in UC Medications - No data to display  Initial Impression / Assessment and Plan / UC Course  I have reviewed the triage vital signs and the nursing notes.  Pertinent labs & imaging results that were available during my care of the patient were reviewed by me and considered in my medical decision making (see chart for details).     Reviewed likely viral bronchitis.  Possible Covid.  Needs to quarantine until test results are available.  Home care discussed.  Call for questions or problems Final Clinical Impressions(s) / UC Diagnoses   Final diagnoses:  Viral URI with cough  Exposure to COVID-19 virus     Discharge Instructions     Take prednisone 2  times a day for 5 days.  Take 2 doses today Take Tessalon 2-3  times a day for cough. Drink plenty of liquids Rest Quarantine at home until your Covid test is available   ED Prescriptions    Medication Sig Dispense Auth. Provider   predniSONE (DELTASONE) 20 MG tablet Take 1 tablet (20 mg total) by mouth 2 (two) times daily with a meal. 10 tablet Eustace Moore, MD   benzonatate (TESSALON) 200 MG capsule Take 1 capsule (200 mg total) by mouth 2 (two) times daily as needed for cough. 20 capsule Eustace Moore, MD     PDMP not reviewed this encounter.   Eustace Moore, MD 06/13/20 [phone]

## 2020-06-13 NOTE — ED Triage Notes (Signed)
Pt present coughing and congestion, symptoms started over a week ago. Pt is having SOB as well.

## 2020-10-19 HISTORY — PX: COLONOSCOPY: SHX174

## 2021-04-25 ENCOUNTER — Encounter: Payer: Self-pay | Admitting: Gastroenterology

## 2021-06-04 ENCOUNTER — Other Ambulatory Visit: Payer: Self-pay | Admitting: Family Medicine

## 2021-06-04 ENCOUNTER — Other Ambulatory Visit: Payer: Self-pay | Admitting: Gynecology

## 2021-06-04 DIAGNOSIS — Z1231 Encounter for screening mammogram for malignant neoplasm of breast: Secondary | ICD-10-CM

## 2021-06-16 ENCOUNTER — Other Ambulatory Visit: Payer: Self-pay

## 2021-06-16 ENCOUNTER — Ambulatory Visit
Admission: RE | Admit: 2021-06-16 | Discharge: 2021-06-16 | Disposition: A | Discharge status: Home / Self Care | Payer: BC Managed Care – PPO | Source: Ambulatory Visit | Attending: Family Medicine | Admitting: Family Medicine

## 2021-06-16 DIAGNOSIS — Z1231 Encounter for screening mammogram for malignant neoplasm of breast: Secondary | ICD-10-CM

## 2021-09-05 ENCOUNTER — Other Ambulatory Visit: Payer: Self-pay

## 2021-09-05 ENCOUNTER — Ambulatory Visit
Admission: RE | Admit: 2021-09-05 | Discharge: 2021-09-05 | Disposition: A | Discharge status: Home / Self Care | Payer: BC Managed Care – PPO | Source: Ambulatory Visit | Attending: Family Medicine | Admitting: Family Medicine

## 2021-09-05 ENCOUNTER — Other Ambulatory Visit: Payer: Self-pay | Admitting: Family Medicine

## 2021-09-05 DIAGNOSIS — R059 Cough, unspecified: Secondary | ICD-10-CM

## 2021-10-01 ENCOUNTER — Other Ambulatory Visit: Payer: Self-pay | Admitting: Neurological Surgery

## 2021-10-22 NOTE — Pre-Procedure Instructions (Signed)
Surgical Instructions    Your procedure is scheduled on Thursday 10/30/21.   Report to [facility] Main Entrance "A" at 09:40 A.M., then check in with the Admitting office.  Call this number if you have problems the morning of surgery:  [phone]   If you have any questions prior to your surgery date call [phone]: Open Monday-Friday 8am-4pm    Remember:  Do not eat after midnight the night before your surgery  You may drink clear liquids until 08:40 A.M. the morning of your surgery.   Clear liquids allowed are: Water, Non-Citrus Juices (without pulp), Carbonated Beverages, Clear Tea, Black Coffee Only, and Gatorade    Take these medicines the morning of surgery with A SIP OF WATER   pregabalin (LYRICA)  venlafaxine XR (EFFEXOR-XR)   As of today, STOP taking any Aspirin (unless otherwise instructed by your surgeon) Aleve, Naproxen, Ibuprofen, Motrin, Advil, Goody's, BC's, all herbal medications, fish oil, and all vitamins.                     Do NOT Smoke (Tobacco/Vaping) or drink Alcohol 24 hours prior to your procedure.  If you use a CPAP at night, you may bring all equipment for your overnight stay.   Contacts, glasses, piercing's, hearing aid's, dentures or partials may not be worn into surgery, please bring cases for these belongings.    For patients admitted to the hospital, discharge time will be determined by your treatment team.   Patients discharged the day of surgery will not be allowed to drive home, and someone needs to stay with them for 24 hours.  NO VISITORS WILL BE ALLOWED IN PRE-OP WHERE PATIENTS GET READY FOR SURGERY.  ONLY 1 SUPPORT PERSON MAY BE PRESENT IN THE WAITING ROOM WHILE YOU ARE IN SURGERY.  IF YOU ARE TO BE ADMITTED, ONCE YOU ARE IN YOUR ROOM YOU WILL BE ALLOWED TWO (2) VISITORS.  Minor children may have two parents present. Special consideration for safety and communication needs will be reviewed on a case by case basis.   Special  instructions:   - Preparing For Surgery  Before surgery, you can play an important role. Because skin is not sterile, your skin needs to be as free of germs as possible. You can reduce the number of germs on your skin by washing with CHG (chlorahexidine gluconate) Soap before surgery.  CHG is an antiseptic cleaner which kills germs and bonds with the skin to continue killing germs even after washing.    Oral Hygiene is also important to reduce your risk of infection.  Remember - BRUSH YOUR TEETH THE MORNING OF SURGERY WITH YOUR REGULAR TOOTHPASTE  Please do not use if you have an allergy to CHG or antibacterial soaps. If your skin becomes reddened/irritated stop using the CHG.  Do not shave (including legs and underarms) for at least 48 hours prior to first CHG shower. It is OK to shave your face.  Please follow these instructions carefully.   Shower the NIGHT BEFORE SURGERY and the MORNING OF SURGERY  If you chose to wash your hair, wash your hair first as usual with your normal shampoo.  After you shampoo, rinse your hair and body thoroughly to remove the shampoo.  Use CHG Soap as you would any other liquid soap. You can apply CHG directly to the skin and wash gently with a scrungie or a clean washcloth.   Apply the CHG Soap to your body ONLY FROM THE NECK  DOWN.  Do not use on open wounds or open sores. Avoid contact with your eyes, ears, mouth and genitals (private parts). Wash Face and genitals (private parts)  with your normal soap.   Wash thoroughly, paying special attention to the area where your surgery will be performed.  Thoroughly rinse your body with warm water from the neck down.  DO NOT shower/wash with your normal soap after using and rinsing off the CHG Soap.  Pat yourself dry with a CLEAN TOWEL.  Wear CLEAN PAJAMAS to bed the night before surgery  Place CLEAN SHEETS on your bed the night before your surgery  DO NOT SLEEP WITH PETS.   Day of  Surgery: Shower with CHG soap. Do not wear jewelry, make up, nail polish, gel polish, artificial nails, or any other type of covering on natural nails including finger and toenails. If patients have artificial nails, gel coating, etc. that need to be removed by a nail salon please have this removed prior to surgery. Surgery may need to be canceled/delayed if the surgeon/ anesthesia feels like the patient is unable to be adequately monitored. Do not wear lotions, powders, perfumes/colognes, or deodorant. Do not shave 48 hours prior to surgery.  Men may shave face and neck. Do not bring valuables to the hospital. [facility] is not responsible for any belongings or valuables. Wear Clean/Comfortable clothing the morning of surgery Remember to brush your teeth WITH YOUR REGULAR TOOTHPASTE.   Please read over the following fact sheets that you were given.   3 days prior to your procedure or After your COVID test   You are not required to quarantine however you are required to wear a well-fitting mask when you are out and around people not in your household. If your mask becomes wet or soiled, replace with a new one.   Wash your hands often with soap and water for 20 seconds or clean your hands with an alcohol-based hand sanitizer that contains at least 60% alcohol.   Do not share personal items.   Notify your provider:  o if you are in close contact with someone who has COVID  o or if you develop a fever of 100.4 or greater, sneezing, cough, sore throat, shortness of breath or body aches.

## 2021-10-23 ENCOUNTER — Encounter (HOSPITAL_COMMUNITY)
Admission: RE | Admit: 2021-10-23 | Discharge: 2021-10-23 | Disposition: A | Discharge status: Home / Self Care | Payer: No Typology Code available for payment source | Source: Ambulatory Visit | Attending: Neurological Surgery | Admitting: Neurological Surgery

## 2021-10-23 ENCOUNTER — Other Ambulatory Visit: Payer: Self-pay

## 2021-10-23 ENCOUNTER — Encounter (HOSPITAL_COMMUNITY): Payer: Self-pay

## 2021-10-23 VITALS — BP 131/86 | HR 82 | Temp 98.2°F | Resp 18 | Ht 66.0 in | Wt 247.3 lb

## 2021-10-23 DIAGNOSIS — I10 Essential (primary) hypertension: Secondary | ICD-10-CM | POA: Diagnosis not present

## 2021-10-23 DIAGNOSIS — Z01818 Encounter for other preprocedural examination: Secondary | ICD-10-CM | POA: Diagnosis not present

## 2021-10-23 DIAGNOSIS — I471 Supraventricular tachycardia: Secondary | ICD-10-CM | POA: Diagnosis not present

## 2021-10-23 HISTORY — DX: Anxiety disorder, unspecified: F41.9

## 2021-10-23 HISTORY — DX: Headache, unspecified: R51.9

## 2021-10-23 HISTORY — DX: Depression, unspecified: F32.A

## 2021-10-23 LAB — BASIC METABOLIC PANEL
Anion gap: 6 (ref 5–15)
BUN: 8 mg/dL (ref 6–20)
CO2: 31 mmol/L (ref 22–32)
Calcium: 9.4 mg/dL (ref 8.9–10.3)
Chloride: 101 mmol/L (ref 98–111)
Creatinine, Ser: 0.91 mg/dL (ref 0.44–1.00)
GFR, Estimated: >60 mL/min (ref >60)
Glucose, Bld: 112 mg/dL — ABNORMAL HIGH (ref 70–99)
Potassium: 4.5 mmol/L (ref 3.5–5.1)
Sodium: 138 mmol/L (ref 135–145)

## 2021-10-23 LAB — CBC
HCT: 42.2 % (ref 36.0–46.0)
Hemoglobin: 14 g/dL (ref 12.0–15.0)
MCH: 27.8 pg (ref 26.0–34.0)
MCHC: 33.2 g/dL (ref 30.0–36.0)
MCV: 83.9 fL (ref 80.0–100.0)
Platelets: 326 10*3/uL (ref 150–400)
RBC: 5.03 MIL/uL (ref 3.87–5.11)
RDW: 14.8 % (ref 11.5–15.5)
WBC: 4.8 10*3/uL (ref 4.0–10.5)
nRBC: 0 % (ref 0.0–0.2)

## 2021-10-23 LAB — SURGICAL PCR SCREEN
MRSA, PCR: NEGATIVE
Staphylococcus aureus: NEGATIVE

## 2021-10-23 NOTE — Progress Notes (Addendum)
PCP - Dr. Caren Macadam Cardiologist - Patient states she has a cardiologist at the [place], [place] but does not know the physician's name. Medical records request faxed to [place], [place] at [phone]. Phone number is [phone]. Called back and stated cardiologist is Dr. Ileene Hutchinson.   PPM/ICD - n/a   Chest x-ray - 09/05/21 EKG - 10/23/21  Stress Test - Patient states she thinks she had a stress test and echocardiogram at the [place] last year but is not sure. Last one in Epic was with Dr. Fransico Him in 2015.  ECHO - 2022 per patient. Records requested. Last one in Epic was with Dr. Fransico Him in 2015 Cardiac Cath - denies  Sleep Study - denies CPAP - n/a  Fasting Blood Sugar - n/a   Blood Thinner Instructions: n/a Aspirin Instructions: n/a  ERAS Protcol - Yes PRE-SURGERY Ensure or G2- No  COVID TEST- Scheduled for Monday 10/27/21 at 0915. Patient is aware if date, time, and location.    Anesthesia review: Yes. Medical Records Requested from [place], [place]. Patient is supposed to call back to PAT with name of cardiologist once she gets home and is able to look the number up in her phone. Patient did not have her phone at PAT.   Patient denies shortness of breath, fever, cough and chest pain at PAT appointment   All instructions explained to the patient, with a verbal understanding of the material. Patient agrees to go over the instructions while at home for a better understanding. Patient also instructed to self quarantine after being tested for COVID-19. The opportunity to ask questions was provided.

## 2021-10-24 NOTE — Progress Notes (Signed)
Anesthesia Chart Review:  Pt follows with cardiology at the [facility] for history of SVT. Last seen by Dr. Fransico Him 06/18/21. Per note, "Toni Lewis is a 56 year old woman with HTN and SVT who presents today for routine  EP follow-up. Toni Lewis was diagnosed with SVT in Oct-Nov 2020 and elected conservative management, was not interested in medical therapy or catheter  ablation. Today Toni Lewis reports intermittent racing palpitations, at times accompanied by lightheadedness and diaphoresis, lasting a few seconds up to 10 minutes. Same as previous, without worsening or change. No change since last EP visit Feb 2022. No CP or SOB. No near syncope or syncope. Echo revealed normal LVEF, no WMAs and no significant valvular abnormalities. Stress test was negative for ischemia. Zio monitor revealed brief SVT, longest 15 minutes in duration. No other arrhythmias." Recommended continue conservative management.  Preop labs reviewed, unremarkable.  EKG 10/23/21: NSR. Rate 68.  CHEST - 2 VIEW 09/05/21: COMPARISON:  10/20/2018   FINDINGS: The heart size and mediastinal contours are within normal limits. Both lungs are clear. Disc degenerative disease of the thoracic spine.   IMPRESSION: No acute abnormality of the lungs.    Echocardiogram: Oct 2020 Interpretation Summary Mild left ventricular hypertrophy Normal left ventricular size and function, EF 0000000 Grade I diastolic dysfunction Normal right ventricular size and function No significant valvular regurgitation or stenosis Mildly dilated visualized proximal ascending aorta, 3.6cm No pericardial effusion No prior study available for comparison  Nuclear medicine stress test: Oct 2020 There are no defects seen on stress or rest images to suggest inducible ischemia  or infarct. Normal cardiac motion. No abnormal extra-cardiac radiotracer uptake.  Impression: 1. No scintigraphic findings to suggest inducible ischemia. 2. Normal calculated ejection  fraction.  Zio monitor: Oct 2020 Impression: Underlying rhythm is sinus with elevated average heart rate, 102 bpm No atrial fibrillation or flutter Episodes of SVT, longest lasting 15 minutes 22 seconds, associated with  patient triggered events No high-grade AV block or prolonged pauses No VT   Wynonia Musty [facility] Short Stay Center/Anesthesiology Phone [phone] 10/24/2021 2:02 PM

## 2021-10-24 NOTE — Anesthesia Preprocedure Evaluation (Addendum)
Anesthesia Evaluation  Patient identified by MRN, date of birth, ID band Patient awake    Reviewed: Allergy & Precautions, NPO status , Patient's Chart, lab work & pertinent test results, reviewed documented beta blocker date and time   Airway Mallampati: II       Dental no notable dental hx.    Pulmonary neg pulmonary ROS,    Pulmonary exam normal        Cardiovascular hypertension, Pt. on medications Normal cardiovascular exam     Neuro/Psych PSYCHIATRIC DISORDERS Anxiety Depression    GI/Hepatic Neg liver ROS,   Endo/Other  Morbid obesity  Renal/GU negative Renal ROS  negative genitourinary   Musculoskeletal negative musculoskeletal ROS (+)   Abdominal (+) + obese,   Peds  Hematology   Anesthesia Other Findings Please let patient know that stress test was normal  Reproductive/Obstetrics                          Anesthesia Physical Anesthesia Plan  ASA: 3  Anesthesia Plan: General   Post-op Pain Management: Dilaudid IV   Induction: Intravenous  PONV Risk Score and Plan: 4 or greater and Ondansetron, Dexamethasone and Midazolam  Airway Management Planned: Oral ETT  Additional Equipment: None  Intra-op Plan:   Post-operative Plan: Extubation in OR  Informed Consent: I have reviewed the patients History and Physical, chart, labs and discussed the procedure including the risks, benefits and alternatives for the proposed anesthesia with the patient or authorized representative who has indicated his/her understanding and acceptance.     Dental advisory given  Plan Discussed with: CRNA  Anesthesia Plan Comments: (PAT note by Karoline Caldwell, PA-C: Pt follows with cardiology at the [facility] for history of SVT. Last seen by Dr. Fransico Him 06/18/21. Per note, "Ms Groves is a 56 year old woman with HTN and SVT who presents today for routine  EP follow-up. Ms Gaier was diagnosed with SVT in  Oct-Nov 2020 and elected conservative management, was not interested in medical therapy or catheter  ablation. Today Ms Rabbitt reports intermittent racing palpitations, at times accompanied by lightheadedness and diaphoresis, lasting a few seconds up to 10 minutes. Same as previous, without worsening or change. No change since last EP visit Feb 2022. No CP or SOB. No near syncope or syncope. Echo revealed normal LVEF, no WMAs and no significant valvular abnormalities. Stress test was negative for ischemia. Zio monitor revealed brief SVT, longest 15 minutes in duration. No other arrhythmias." Recommended continue conservative management.  Preop labs reviewed, unremarkable.  EKG 10/23/21: NSR. Rate 68.  CHEST - 2 VIEW 09/05/21: COMPARISON: 10/20/2018  FINDINGS: The heart size and mediastinal contours are within normal limits. Both lungs are clear. Disc degenerative disease of the thoracic spine.  IMPRESSION: No acute abnormality of the lungs.   Echocardiogram: Oct 2020 Interpretation Summary Mild left ventricular hypertrophy Normal left ventricular size and function, EF 0000000 Grade I diastolic dysfunction Normal right ventricular size and function No significant valvular regurgitation or stenosis Mildly dilated visualized proximal ascending aorta, 3.6cm No pericardial effusion No prior study available for comparison  Nuclear medicine stress test: Oct 2020 There are no defects seen on stress or rest images to suggest inducible ischemia  or infarct. Normal cardiac motion. No abnormal extra-cardiac radiotracer uptake.  Impression: 1. No scintigraphic findings to suggest inducible ischemia. 2. Normal calculated ejection fraction.  Zio monitor: Oct 2020 Impression: Underlying rhythm is sinus with elevated average heart rate, 102 bpm No atrial  fibrillation or flutter Episodes of SVT, longest lasting 15 minutes 22 seconds, associated with  patient triggered events No high-grade AV  block or prolonged pauses No VT  )      Anesthesia Quick Evaluation

## 2021-10-27 ENCOUNTER — Other Ambulatory Visit (HOSPITAL_COMMUNITY)
Admission: RE | Admit: 2021-10-27 | Discharge: 2021-10-27 | Disposition: A | Discharge status: Home / Self Care | Payer: BC Managed Care – PPO | Source: Ambulatory Visit | Attending: Neurological Surgery | Admitting: Neurological Surgery

## 2021-10-27 DIAGNOSIS — Z01818 Encounter for other preprocedural examination: Secondary | ICD-10-CM

## 2021-10-27 DIAGNOSIS — Z01812 Encounter for preprocedural laboratory examination: Secondary | ICD-10-CM | POA: Diagnosis not present

## 2021-10-27 DIAGNOSIS — Z20822 Contact with and (suspected) exposure to covid-19: Secondary | ICD-10-CM | POA: Insufficient documentation

## 2021-10-27 LAB — SARS CORONAVIRUS 2 (TAT 6-24 HRS): SARS Coronavirus 2: NEGATIVE

## 2021-10-30 ENCOUNTER — Encounter (HOSPITAL_COMMUNITY)
Admission: RE | Disposition: A | Discharge status: Home / Self Care | Payer: Self-pay | Source: Home / Self Care | Attending: Neurological Surgery

## 2021-10-30 ENCOUNTER — Ambulatory Visit (HOSPITAL_COMMUNITY): Payer: No Typology Code available for payment source

## 2021-10-30 ENCOUNTER — Ambulatory Visit (HOSPITAL_COMMUNITY): Payer: No Typology Code available for payment source | Admitting: Physician Assistant

## 2021-10-30 ENCOUNTER — Encounter (HOSPITAL_COMMUNITY): Payer: Self-pay | Admitting: Neurological Surgery

## 2021-10-30 ENCOUNTER — Ambulatory Visit (HOSPITAL_COMMUNITY): Payer: No Typology Code available for payment source | Admitting: Anesthesiology

## 2021-10-30 ENCOUNTER — Observation Stay (HOSPITAL_COMMUNITY)
Admission: RE | Admit: 2021-10-30 | Discharge: 2021-10-31 | Disposition: A | Discharge status: Home / Self Care | Payer: No Typology Code available for payment source | Attending: Neurological Surgery | Admitting: Neurological Surgery

## 2021-10-30 DIAGNOSIS — M5416 Radiculopathy, lumbar region: Secondary | ICD-10-CM | POA: Diagnosis present

## 2021-10-30 DIAGNOSIS — M4726 Other spondylosis with radiculopathy, lumbar region: Secondary | ICD-10-CM | POA: Diagnosis not present

## 2021-10-30 DIAGNOSIS — M50122 Cervical disc disorder at C5-C6 level with radiculopathy: Secondary | ICD-10-CM | POA: Diagnosis not present

## 2021-10-30 DIAGNOSIS — Z79899 Other long term (current) drug therapy: Secondary | ICD-10-CM | POA: Insufficient documentation

## 2021-10-30 DIAGNOSIS — M5010 Cervical disc disorder with radiculopathy, unspecified cervical region: Secondary | ICD-10-CM | POA: Diagnosis present

## 2021-10-30 DIAGNOSIS — M4722 Other spondylosis with radiculopathy, cervical region: Principal | ICD-10-CM | POA: Insufficient documentation

## 2021-10-30 DIAGNOSIS — Z419 Encounter for procedure for purposes other than remedying health state, unspecified: Secondary | ICD-10-CM

## 2021-10-30 DIAGNOSIS — I10 Essential (primary) hypertension: Secondary | ICD-10-CM | POA: Diagnosis not present

## 2021-10-30 HISTORY — PX: LUMBAR LAMINECTOMY/ DECOMPRESSION WITH MET-RX: SHX5959

## 2021-10-30 HISTORY — PX: POSTERIOR CERVICAL LAMINECTOMY WITH MET- RX: SHX6035

## 2021-10-30 SURGERY — POSTERIOR CERVICAL LAMINECTOMY WITH MET- RX
Anesthesia: General | Site: Back | Laterality: Right

## 2021-10-30 MED ORDER — KETOROLAC TROMETHAMINE 15 MG/ML IJ SOLN
15.0000 mg | Freq: Once | INTRAMUSCULAR | Status: AC
  Administered 2021-10-30: 15 mg via INTRAVENOUS

## 2021-10-30 MED ORDER — HYDROCHLOROTHIAZIDE 12.5 MG PO TABS
12.5000 mg | ORAL_TABLET | Freq: Every day | ORAL | Status: DC
  Administered 2021-10-30: 12.5 mg via ORAL
  Filled 2021-10-30: qty 1

## 2021-10-30 MED ORDER — DEXAMETHASONE SODIUM PHOSPHATE 10 MG/ML IJ SOLN
INTRAMUSCULAR | Status: DC | PRN
Start: 2021-10-30 — End: 2021-10-30
  Administered 2021-10-30: 10 mg via INTRAVENOUS

## 2021-10-30 MED ORDER — LIDOCAINE-EPINEPHRINE 1 %-1:100000 IJ SOLN
INTRAMUSCULAR | Status: AC
  Filled 2021-10-30: qty 1

## 2021-10-30 MED ORDER — AMITRIPTYLINE HCL 100 MG PO TABS
100.0000 mg | ORAL_TABLET | Freq: Every day | ORAL | Status: DC
  Filled 2021-10-30: qty 1

## 2021-10-30 MED ORDER — PROPOFOL 10 MG/ML IV BOLUS
INTRAVENOUS | Status: DC | PRN
  Administered 2021-10-30: 200 mg via INTRAVENOUS

## 2021-10-30 MED ORDER — MEPERIDINE HCL 25 MG/ML IJ SOLN: 6.2500 mg | INTRAMUSCULAR | Status: DC | PRN

## 2021-10-30 MED ORDER — ROCURONIUM BROMIDE 10 MG/ML (PF) SYRINGE
PREFILLED_SYRINGE | INTRAVENOUS | Status: AC
  Filled 2021-10-30: qty 10

## 2021-10-30 MED ORDER — ONDANSETRON HCL 4 MG/2ML IJ SOLN
INTRAMUSCULAR | Status: DC | PRN
  Administered 2021-10-30: 4 mg via INTRAVENOUS

## 2021-10-30 MED ORDER — LIDOCAINE 2% (20 MG/ML) 5 ML SYRINGE
INTRAMUSCULAR | Status: DC | PRN
  Administered 2021-10-30: 100 mg via INTRAVENOUS

## 2021-10-30 MED ORDER — ACETAMINOPHEN 650 MG RE SUPP: 650.0000 mg | RECTAL | Status: DC | PRN

## 2021-10-30 MED ORDER — HYDROMORPHONE HCL 1 MG/ML IJ SOLN
INTRAMUSCULAR | Status: AC
  Filled 2021-10-30: qty 1

## 2021-10-30 MED ORDER — METHOCARBAMOL 1000 MG/10ML IJ SOLN
500.0000 mg | Freq: Four times a day (QID) | INTRAVENOUS | Status: DC | PRN
  Filled 2021-10-30: qty 5

## 2021-10-30 MED ORDER — THROMBIN 5000 UNITS EX SOLR
OROMUCOSAL | Status: DC | PRN
  Administered 2021-10-30: 5 mL

## 2021-10-30 MED ORDER — FLEET ENEMA 7-19 GM/118ML RE ENEM: 1.0000 | ENEMA | Freq: Once | RECTAL | Status: DC | PRN

## 2021-10-30 MED ORDER — BUPIVACAINE HCL (PF) 0.5 % IJ SOLN
INTRAMUSCULAR | Status: DC | PRN
  Administered 2021-10-30: 10 mL
  Administered 2021-10-30: 3 mL
  Administered 2021-10-30: 5 mL

## 2021-10-30 MED ORDER — METHOCARBAMOL 500 MG PO TABS
500.0000 mg | ORAL_TABLET | Freq: Four times a day (QID) | ORAL | Status: DC | PRN
  Administered 2021-10-30 – 2021-10-31 (×2): 500 mg via ORAL
  Filled 2021-10-30: qty 1

## 2021-10-30 MED ORDER — ROCURONIUM BROMIDE 10 MG/ML (PF) SYRINGE
PREFILLED_SYRINGE | INTRAVENOUS | Status: DC | PRN
  Administered 2021-10-30: 30 mg via INTRAVENOUS
  Administered 2021-10-30: 20 mg via INTRAVENOUS
  Administered 2021-10-30: 80 mg via INTRAVENOUS

## 2021-10-30 MED ORDER — BUPIVACAINE HCL (PF) 0.5 % IJ SOLN
INTRAMUSCULAR | Status: AC
  Filled 2021-10-30: qty 30

## 2021-10-30 MED ORDER — THROMBIN 5000 UNITS EX SOLR
CUTANEOUS | Status: AC
  Filled 2021-10-30: qty 5000

## 2021-10-30 MED ORDER — CHLORHEXIDINE GLUCONATE CLOTH 2 % EX PADS: 6.0000 | MEDICATED_PAD | Freq: Once | CUTANEOUS | Status: DC

## 2021-10-30 MED ORDER — MORPHINE SULFATE (PF) 2 MG/ML IV SOLN: 2.0000 mg | INTRAVENOUS | Status: DC | PRN

## 2021-10-30 MED ORDER — DOCUSATE SODIUM 100 MG PO CAPS
100.0000 mg | ORAL_CAPSULE | Freq: Two times a day (BID) | ORAL | Status: DC
  Administered 2021-10-30: 100 mg via ORAL
  Filled 2021-10-30: qty 1

## 2021-10-30 MED ORDER — PROMETHAZINE HCL 25 MG/ML IJ SOLN: 6.2500 mg | INTRAMUSCULAR | Status: DC | PRN

## 2021-10-30 MED ORDER — DIAZEPAM 5 MG/ML IJ SOLN
5.0000 mg | Freq: Once | INTRAMUSCULAR | Status: AC
  Administered 2021-10-30: 5 mg via INTRAVENOUS

## 2021-10-30 MED ORDER — BENZONATATE 100 MG PO CAPS
200.0000 mg | ORAL_CAPSULE | Freq: Two times a day (BID) | ORAL | Status: DC | PRN
  Administered 2021-10-31: 200 mg via ORAL
  Filled 2021-10-30 (×2): qty 2

## 2021-10-30 MED ORDER — THROMBIN 20000 UNITS EX SOLR
CUTANEOUS | Status: AC
  Filled 2021-10-30: qty 20000

## 2021-10-30 MED ORDER — VENLAFAXINE HCL ER 75 MG PO CP24
150.0000 mg | ORAL_CAPSULE | Freq: Every day | ORAL | Status: DC
  Administered 2021-10-31: 150 mg via ORAL
  Filled 2021-10-30: qty 2

## 2021-10-30 MED ORDER — OXYCODONE-ACETAMINOPHEN 5-325 MG PO TABS
1.0000 | ORAL_TABLET | ORAL | Status: DC | PRN
  Administered 2021-10-30 – 2021-10-31 (×2): 2 via ORAL
  Filled 2021-10-30 (×2): qty 2

## 2021-10-30 MED ORDER — DIAZEPAM 5 MG/ML IJ SOLN
INTRAMUSCULAR | Status: AC
  Filled 2021-10-30: qty 2

## 2021-10-30 MED ORDER — FENTANYL CITRATE (PF) 250 MCG/5ML IJ SOLN
INTRAMUSCULAR | Status: AC
  Filled 2021-10-30: qty 5

## 2021-10-30 MED ORDER — LIDOCAINE 2% (20 MG/ML) 5 ML SYRINGE
INTRAMUSCULAR | Status: AC
  Filled 2021-10-30: qty 5

## 2021-10-30 MED ORDER — ORAL CARE MOUTH RINSE: 15.0000 mL | Freq: Once | OROMUCOSAL | Status: AC

## 2021-10-30 MED ORDER — ONDANSETRON HCL 4 MG PO TABS
4.0000 mg | ORAL_TABLET | Freq: Four times a day (QID) | ORAL | Status: DC | PRN
  Administered 2021-10-31: 4 mg via ORAL
  Filled 2021-10-30: qty 1

## 2021-10-30 MED ORDER — MIDAZOLAM HCL 2 MG/2ML IJ SOLN
INTRAMUSCULAR | Status: AC
  Filled 2021-10-30: qty 2

## 2021-10-30 MED ORDER — MIDAZOLAM HCL 5 MG/5ML IJ SOLN
INTRAMUSCULAR | Status: DC | PRN
  Administered 2021-10-30: 2 mg via INTRAVENOUS

## 2021-10-30 MED ORDER — BISACODYL 10 MG RE SUPP: 10.0000 mg | Freq: Every day | RECTAL | Status: DC | PRN

## 2021-10-30 MED ORDER — PHENOL 1.4 % MT LIQD: 1.0000 | OROMUCOSAL | Status: DC | PRN

## 2021-10-30 MED ORDER — SODIUM CHLORIDE 0.9% FLUSH
3.0000 mL | Freq: Two times a day (BID) | INTRAVENOUS | Status: DC
  Administered 2021-10-30: 3 mL via INTRAVENOUS

## 2021-10-30 MED ORDER — SUGAMMADEX SODIUM 200 MG/2ML IV SOLN
INTRAVENOUS | Status: DC | PRN
  Administered 2021-10-30: 200 mg via INTRAVENOUS

## 2021-10-30 MED ORDER — ACETAMINOPHEN 10 MG/ML IV SOLN: 1000.0000 mg | Freq: Once | INTRAVENOUS | Status: DC | PRN

## 2021-10-30 MED ORDER — METHOCARBAMOL 500 MG PO TABS
ORAL_TABLET | ORAL | Status: AC
  Filled 2021-10-30: qty 1

## 2021-10-30 MED ORDER — MENTHOL 3 MG MT LOZG: 1.0000 | LOZENGE | OROMUCOSAL | Status: DC | PRN

## 2021-10-30 MED ORDER — LOSARTAN POTASSIUM-HCTZ 50-12.5 MG PO TABS: 1.0000 | ORAL_TABLET | Freq: Every day | ORAL | Status: DC

## 2021-10-30 MED ORDER — LOSARTAN POTASSIUM 50 MG PO TABS
50.0000 mg | ORAL_TABLET | Freq: Every day | ORAL | Status: DC
  Administered 2021-10-30: 50 mg via ORAL
  Filled 2021-10-30: qty 1

## 2021-10-30 MED ORDER — KETOROLAC TROMETHAMINE 15 MG/ML IJ SOLN
INTRAMUSCULAR | Status: AC
  Filled 2021-10-30: qty 1

## 2021-10-30 MED ORDER — PROPOFOL 10 MG/ML IV BOLUS
INTRAVENOUS | Status: AC
  Filled 2021-10-30: qty 20

## 2021-10-30 MED ORDER — FENTANYL CITRATE (PF) 250 MCG/5ML IJ SOLN
INTRAMUSCULAR | Status: DC | PRN
  Administered 2021-10-30: 250 ug via INTRAVENOUS
  Administered 2021-10-30 (×3): 50 ug via INTRAVENOUS

## 2021-10-30 MED ORDER — CHLORHEXIDINE GLUCONATE 0.12 % MT SOLN
15.0000 mL | Freq: Once | OROMUCOSAL | Status: AC
  Administered 2021-10-30: 15 mL via OROMUCOSAL
  Filled 2021-10-30: qty 15

## 2021-10-30 MED ORDER — DEXAMETHASONE SODIUM PHOSPHATE 10 MG/ML IJ SOLN
INTRAMUSCULAR | Status: AC
  Filled 2021-10-30: qty 2

## 2021-10-30 MED ORDER — CEFAZOLIN SODIUM-DEXTROSE 2-4 GM/100ML-% IV SOLN
2.0000 g | Freq: Three times a day (TID) | INTRAVENOUS | Status: AC
  Administered 2021-10-30 – 2021-10-31 (×2): 2 g via INTRAVENOUS
  Filled 2021-10-30 (×2): qty 100

## 2021-10-30 MED ORDER — LACTATED RINGERS IV SOLN: INTRAVENOUS | Status: DC

## 2021-10-30 MED ORDER — ACETAMINOPHEN 325 MG PO TABS: 650.0000 mg | ORAL_TABLET | ORAL | Status: DC | PRN

## 2021-10-30 MED ORDER — HYDROMORPHONE HCL 1 MG/ML IJ SOLN
0.2500 mg | INTRAMUSCULAR | Status: DC | PRN
  Administered 2021-10-30 (×4): 0.5 mg via INTRAVENOUS

## 2021-10-30 MED ORDER — PREGABALIN 25 MG PO CAPS
50.0000 mg | ORAL_CAPSULE | Freq: Two times a day (BID) | ORAL | Status: DC
  Administered 2021-10-30: 50 mg via ORAL
  Filled 2021-10-30: qty 2

## 2021-10-30 MED ORDER — 0.9 % SODIUM CHLORIDE (POUR BTL) OPTIME
TOPICAL | Status: DC | PRN
  Administered 2021-10-30: 1000 mL

## 2021-10-30 MED ORDER — ALUM & MAG HYDROXIDE-SIMETH 200-200-20 MG/5ML PO SUSP: 30.0000 mL | Freq: Four times a day (QID) | ORAL | Status: DC | PRN

## 2021-10-30 MED ORDER — ONDANSETRON HCL 4 MG/2ML IJ SOLN
4.0000 mg | Freq: Four times a day (QID) | INTRAMUSCULAR | Status: DC | PRN
  Administered 2021-10-30: 4 mg via INTRAVENOUS
  Filled 2021-10-30: qty 2

## 2021-10-30 MED ORDER — LIDOCAINE-EPINEPHRINE 1 %-1:100000 IJ SOLN
INTRAMUSCULAR | Status: DC | PRN
  Administered 2021-10-30: 5 mL
  Administered 2021-10-30: 3 mL

## 2021-10-30 MED ORDER — SODIUM CHLORIDE 0.9 % IV SOLN: 250.0000 mL | INTRAVENOUS | Status: DC

## 2021-10-30 MED ORDER — SENNA 8.6 MG PO TABS
1.0000 | ORAL_TABLET | Freq: Two times a day (BID) | ORAL | Status: DC
  Administered 2021-10-30: 8.6 mg via ORAL
  Filled 2021-10-30: qty 1

## 2021-10-30 MED ORDER — CEFAZOLIN SODIUM-DEXTROSE 2-4 GM/100ML-% IV SOLN
2.0000 g | INTRAVENOUS | Status: AC
  Administered 2021-10-30: 2 g via INTRAVENOUS
  Filled 2021-10-30: qty 100

## 2021-10-30 MED ORDER — POLYETHYLENE GLYCOL 3350 17 G PO PACK: 17.0000 g | PACK | Freq: Every day | ORAL | Status: DC | PRN

## 2021-10-30 MED ORDER — ONDANSETRON HCL 4 MG/2ML IJ SOLN
INTRAMUSCULAR | Status: AC
  Filled 2021-10-30: qty 2

## 2021-10-30 MED ORDER — SODIUM CHLORIDE 0.9% FLUSH: 3.0000 mL | INTRAVENOUS | Status: DC | PRN

## 2021-10-30 MED ORDER — THROMBIN 20000 UNITS EX SOLR
CUTANEOUS | Status: DC | PRN
  Administered 2021-10-30: 20 mL

## 2021-10-30 SURGICAL SUPPLY — 56 items
ADH SKN CLS APL DERMABOND .7 (GAUZE/BANDAGES/DRESSINGS) ×4
BAG COUNTER SPONGE SURGICOUNT (BAG) ×8 IMPLANT
BAG SPNG CNTER NS LX DISP (BAG) ×4
BAND INSRT 18 STRL LF DISP RB (MISCELLANEOUS) ×4
BAND RUBBER #18 3X1/16 STRL (MISCELLANEOUS) ×8 IMPLANT
BLADE CLIPPER SURG (BLADE) IMPLANT
BLADE SURG 15 STRL LF DISP TIS (BLADE) ×6 IMPLANT
BLADE SURG 15 STRL SS (BLADE) ×6
BUR PRECISION MATCH 2.5 (BURR) ×5 IMPLANT
BUR PRECISION MATCH 3.0 13 (BURR) ×4 IMPLANT
CANISTER SUCT 3000ML PPV (MISCELLANEOUS) ×8 IMPLANT
CARTRIDGE OIL MAESTRO DRILL (MISCELLANEOUS) ×3 IMPLANT
COVER MAYO STAND STRL (DRAPES) IMPLANT
DECANTER SPIKE VIAL GLASS SM (MISCELLANEOUS) ×8 IMPLANT
DERMABOND ADVANCED (GAUZE/BANDAGES/DRESSINGS) ×2
DERMABOND ADVANCED .7 DNX12 (GAUZE/BANDAGES/DRESSINGS) ×9 IMPLANT
DIFFUSER DRILL AIR PNEUMATIC (MISCELLANEOUS) ×4 IMPLANT
DRAPE C-ARM 42X72 X-RAY (DRAPES) ×8 IMPLANT
DRAPE LAPAROTOMY 100X72 PEDS (DRAPES) ×4 IMPLANT
DRAPE LAPAROTOMY 100X72X124 (DRAPES) ×4 IMPLANT
DRAPE MICROSCOPE LEICA (MISCELLANEOUS) ×8 IMPLANT
DURAPREP 6ML APPLICATOR 50/CS (WOUND CARE) ×8 IMPLANT
ELECT BLADE 4.0 EZ CLEAN MEGAD (MISCELLANEOUS) ×3
ELECT BLADE 6.5 EXT (BLADE) ×4 IMPLANT
ELECT REM PT RETURN 9FT ADLT (ELECTROSURGICAL) ×6
ELECTRODE BLDE 4.0 EZ CLN MEGD (MISCELLANEOUS) ×3 IMPLANT
ELECTRODE REM PT RTRN 9FT ADLT (ELECTROSURGICAL) ×6 IMPLANT
GAUZE 4X4 16PLY ~~LOC~~+RFID DBL (SPONGE) IMPLANT
GLOVE EXAM NITRILE XL STR (GLOVE) IMPLANT
GLOVE SURG LTX SZ8.5 (GLOVE) ×8 IMPLANT
GLOVE SURG UNDER POLY LF SZ8.5 (GLOVE) ×8 IMPLANT
GOWN STRL REUS W/ TWL LRG LVL3 (GOWN DISPOSABLE) IMPLANT
GOWN STRL REUS W/ TWL XL LVL3 (GOWN DISPOSABLE) IMPLANT
GOWN STRL REUS W/TWL 2XL LVL3 (GOWN DISPOSABLE) ×8 IMPLANT
GOWN STRL REUS W/TWL LRG LVL3 (GOWN DISPOSABLE)
GOWN STRL REUS W/TWL XL LVL3 (GOWN DISPOSABLE)
HEMOSTAT POWDER KIT SURGIFOAM (HEMOSTASIS) ×8 IMPLANT
KIT BASIN OR (CUSTOM PROCEDURE TRAY) ×8 IMPLANT
KIT TURNOVER KIT B (KITS) ×8 IMPLANT
NDL HYPO 18GX1.5 BLUNT FILL (NEEDLE) IMPLANT
NDL SPNL 20GX3.5 QUINCKE YW (NEEDLE) IMPLANT
NEEDLE HYPO 18GX1.5 BLUNT FILL (NEEDLE) IMPLANT
NEEDLE HYPO 22GX1.5 SAFETY (NEEDLE) ×8 IMPLANT
NEEDLE SPNL 20GX3.5 QUINCKE YW (NEEDLE) IMPLANT
NS IRRIG 1000ML POUR BTL (IV SOLUTION) ×8 IMPLANT
OIL CARTRIDGE MAESTRO DRILL (MISCELLANEOUS) ×3
PACK LAMINECTOMY NEURO (CUSTOM PROCEDURE TRAY) ×8 IMPLANT
PAD ARMBOARD 7.5X6 YLW CONV (MISCELLANEOUS) ×24 IMPLANT
PIN MAYFIELD SKULL DISP (PIN) ×4 IMPLANT
SPONGE SURGIFOAM ABS GEL SZ50 (HEMOSTASIS) ×4 IMPLANT
SUT VIC AB 3-0 SH 8-18 (SUTURE) ×8 IMPLANT
SUT VIC AB 4-0 RB1 18 (SUTURE) ×8 IMPLANT
SYR 5ML LL (SYRINGE) IMPLANT
TOWEL GREEN STERILE (TOWEL DISPOSABLE) ×8 IMPLANT
TOWEL GREEN STERILE FF (TOWEL DISPOSABLE) ×8 IMPLANT
WATER STERILE IRR 1000ML POUR (IV SOLUTION) ×8 IMPLANT

## 2021-10-30 NOTE — Anesthesia Procedure Notes (Signed)
Procedure Name: Intubation Date/Time: 10/30/2021 12:22 PM Performed by: Myna Bright, CRNA Pre-anesthesia Checklist: Patient identified, Emergency Drugs available, Suction available and Patient being monitored Patient Re-evaluated:Patient Re-evaluated prior to induction Oxygen Delivery Method: Circle system utilized Preoxygenation: Pre-oxygenation with 100% oxygen Induction Type: IV induction Ventilation: Mask ventilation without difficulty Laryngoscope Size: Mac and 3 Grade View: Grade III Tube type: Oral Tube size: 7.0 mm Number of attempts: 2 Airway Equipment and Method: Bougie stylet Placement Confirmation: ETT inserted through vocal cords under direct vision, positive ETCO2 and breath sounds checked- equal and bilateral Secured at: 21 cm Tube secured with: Tape Dental Injury: Teeth and Oropharynx as per pre-operative assessment  Difficulty Due To: Difficult Airway- due to anterior larynx Comments: Easy mask airway. View of epiglottis only. Used bougie to advance ETT without difficulty.

## 2021-10-30 NOTE — Transfer of Care (Signed)
Immediate Anesthesia Transfer of Care Note  Patient: Toni Lewis  Procedure(s) Performed: Bilateral Cervical Five-Six Foraminotomy w/Metrx (Bilateral: Back) Right Lumbar Three-Four Lumbar Four-Five Laminotomy, Discectomy with metrx (Right: Back)  Patient Location: PACU  Anesthesia Type:General  Level of Consciousness: awake, alert  and oriented  Airway & Oxygen Therapy: Patient Spontanous Breathing and Patient connected to face mask oxygen  Post-op Assessment: Report given to RN and Post -op Vital signs reviewed and stable  Post vital signs: Reviewed and stable  Last Vitals:  Vitals Value Taken Time  BP 120/77 10/30/21 1600  Temp    Pulse 99 10/30/21 1603  Resp 20 10/30/21 1603  SpO2 100 % 10/30/21 1603  Vitals shown include unvalidated device data.  Last Pain:  Vitals:   10/30/21 1007  TempSrc:   PainSc: 8       Patients Stated Pain Goal: 2 ([id number])  Complications: No notable events documented.

## 2021-10-30 NOTE — H&P (Signed)
Toni Lewis is an 56 y.o. female.   Chief Complaint: Right lower extremity pain, bilateral tingling and numbness in the arms and hands. HPI: Toni Lewis is a 56 year old individuals had significant cervical spondylitic disease in addition to lumbar spondylosis with radiculopathy.  This all started after some work-related injuries where she has had a fall and sustained lumbar radicular pain she underwent decompression and fusion at the level of L5-S1 and subsequently has had continued radicular pain in the right lower extremity this is secondary to some spondylitic stenosis in the foramen and subarticular region at L3-4 and L4-5.  She had been advised regarding surgical decompression using Metrix retractor to do this in a minimally invasive fashion with minimal downtime.  Furthermore she has had cervical radicular pain in a C6 distribution she has had previous anterior cervical decompression and arthroplasty but in the process is for him some hypertrophy in the facet joints that has created some foraminal stenosis for the C6 nerve root.  I have advised a posterior decompression of the C6 nerve root via bilateral laminotomies and foraminotomies using again a Metrix approach.  She is now admitted for the surgery.  Past Medical History:  Diagnosis Date   Anal fissure    Anxiety    Depression    Endometriosis    Headache    Hemorrhoids    Hypertension    Peptic ulcer disease     Past Surgical History:  Procedure Laterality Date   COLONOSCOPY  2022   ENDOMETRIAL ABLATION     TONSILLECTOMY      Family History  Problem Relation Age of Onset   Heart failure Mother    Heart attack Mother    Heart disease Mother    Breast cancer Maternal Aunt    Social History:  reports that she has never smoked. She has never used smokeless tobacco. She reports current alcohol use. She reports that she does not use drugs.  Allergies:  Allergies  Allergen Reactions   Vicodin [Hydrocodone-Acetaminophen]  Nausea And Vomiting    Medications Prior to Admission  Medication Sig Dispense Refill   amitriptyline (ELAVIL) 100 MG tablet Take 100 mg by mouth at bedtime.     benzonatate (TESSALON) 200 MG capsule Take 1 capsule (200 mg total) by mouth 2 (two) times daily as needed for cough. 20 capsule 0   losartan-hydrochlorothiazide (HYZAAR) 50-12.5 MG tablet Take 1 tablet by mouth daily.     Multiple Vitamin (MULTIVITAMIN WITH MINERALS) TABS tablet Take 1 tablet by mouth daily.     pregabalin (LYRICA) 50 MG capsule Take 50 mg by mouth 2 (two) times daily.     pyridoxine (B-6) 100 MG tablet Take 100 mg by mouth daily.     venlafaxine XR (EFFEXOR-XR) 150 MG 24 hr capsule Take 150 mg by mouth daily with breakfast.     vitamin B-12 (CYANOCOBALAMIN) 500 MCG tablet Take 500 mcg by mouth daily.     predniSONE (DELTASONE) 20 MG tablet Take 1 tablet (20 mg total) by mouth 2 (two) times daily with a meal. 10 tablet 0    No results found for this or any previous visit (from the past 48 hour(s)). No results found.  Review of Systems  Constitutional:  Positive for activity change.  Musculoskeletal:  Positive for back pain, joint swelling, myalgias, neck pain and neck stiffness.  Neurological:  Positive for weakness and numbness.  Hematological: Negative.   Psychiatric/Behavioral: Negative.    All other systems reviewed and are negative.  Blood pressure (!) 152/95, pulse 92, temperature 98.6 F (37 C), temperature source Oral, resp. rate 18, height 5\' 6"  (1.676 m), weight 113.4 kg, SpO2 97 %. Physical Exam Constitutional:      Appearance: Normal appearance. She is normal weight.  HENT:     Head: Normocephalic and atraumatic.     Right Ear: Tympanic membrane normal.     Left Ear: Tympanic membrane normal.     Nose: Nose normal.     Mouth/Throat:     Mouth: Mucous membranes are moist.  Eyes:     Extraocular Movements: Extraocular movements intact.     Pupils: Pupils are equal, round, and reactive to  light.  Cardiovascular:     Rate and Rhythm: Normal rate and regular rhythm.  Pulmonary:     Effort: Pulmonary effort is normal.     Breath sounds: Normal breath sounds.  Abdominal:     General: Abdomen is flat.     Palpations: Abdomen is soft.  Musculoskeletal:        General: Normal range of motion.     Cervical back: Normal range of motion and neck supple.  Skin:    General: Skin is warm and dry.  Neurological:     Mental Status: She is alert.     Comments: Mild weakness in the bicep wrist extensors and grips bilaterally at 4+ out of 5.  Decreased biceps reflex to trace bilaterally 2+ triceps reflex 1+ brachioradialis reflex.  Lower extremity strength reveals mild weakness in tibialis anterior on the right side otherwise strength is intact patellar reflexes decreased on the right compared to the left.  Lower extremity strength otherwise is normal.  Cranial nerve examination is within the limits of normal.  Psychiatric:        Mood and Affect: Mood normal.        Behavior: Behavior normal.        Thought Content: Thought content normal.        Judgment: Judgment normal.     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  Lumbar spondylosis and radiculopathy L3-4 and L4-5 on the right with right-sided radiculopathies.  Plan: Metrix decompression C5-6 bilaterally, Metrix decompression via laminotomy and foraminotomies L3-4 and L4-5 on the right.  , MD 10/30/2021, 11:26 AM

## 2021-10-30 NOTE — Op Note (Signed)
Date of surgery: 10/30/2021 Preoperative diagnosis: Lumbar radiculopathy L3 and L4 on the right secondary to spondylosis and extraforaminal nerve root compression L3-4 L4-5 right. Bilateral C6 radiculopathy secondary to spondylosis and facet arthropathy C5-C6.  History of arthroplasty C5-C6 Postoperative diagnosis: Same Procedure: Extraforaminal decompression L3-4 and L4-5 on the right using Metrix retractor operating microscope microdissection technique. Bilateral laminotomies and foraminotomies C5-C6 using Metrix retractor operating microscope microdissection technique. Surgeon: Barnett Abu First Assistant: Hoyt Koch, MD Anesthesia: General endotracheal Indications: Toni Lewis is a 56 year old individual whose had significant cervical and lumbar radiculopathies these involve the C6 distribution in both upper extremities and she has had a decompression arthroplasty at C4-5 and C5-6 in the past.  She has biforaminal stenosis at the C5-6 level.  In addition she has been having right lumbar radicular pain primarily in L3 distribution down the anterior lateral aspect of the thigh she has significant foraminal stenosis on the right side at L3-4 and L4-5 secondary to a combination of disc degeneration facet hypertrophy creating foraminal stenosis.  She is been advised regarding surgical decompression of both these areas and is taken to the operating room to undergo this procedure using a Metrix technique in the lumbar spine and the cervical spine.  Procedure: Patient was brought to the operating room supine on the stretcher.  After the smooth induction of general tracheal anesthesia she was carefully turned to the prone position with her head placed in the 3 pin headrest and secured to the [place] headrest.  Care was taken to position the shoulders inferiorly to allow good access to the region of the neck and also to talk to arms at the sides so that radiographic imaging using fluoroscopy could be  obtained of the lumbar spine in the AP and lateral projections.  Once this was secured the back and the neck were prepped with alcohol DuraPrep and draped in a sterile fashion the back was draped out first and then fluoroscopic guidance was used to localize the level of L3-4 and L4-5 on the right side.  The skin in the chosen area of the incision was infiltrated with lidocaine 1% with epinephrine half percent Marcaine 50-50 mixture for total volume of 10 cc.  A vertical incision was created and the K wire was passed down to the outer portion laminar arch at L4-5 first then using a winding technique a series of dilators were passed to this region to dilate to the 20 mm diameter.  A 22 mm x 8 cm long Metrix cannula was then used to secure in the operating channel down to the extraforaminal zone at L4-L5.  The tube was held to the operating table with a clamp the operating microscope was draped and brought into the field superficial fascia was cauterized away from this area and then along the lateral aspect of the laminar arch and the junction with the facet was decompressed by removing bone from this region to expose the path for the exit of the L4 nerve root inferiorly.  The intertransverse musculature was taken down carefully and hemostasis was achieved each step of the way and then the L4 nerve root could be identified inferiorly in the foramen and the dissection was taken alongside the L4 nerve root to explore the undersurface in this region there was noted to be significant bony prominence of the extraforaminal zone.  The area of the foramen itself was then decompressed to allow good passage of the L4 nerve root out laterally over the bony prominences in the extraforaminal  zone.  The region the extraforaminal zone did not present any soft disc material in this area but once a good dorsal decompression was obtained it was felt that no further resection of bone was required.  Hemostasis was achieved in this area  and area was infiltrated with half cc of half percent Marcaine.  The retractor was then removed and the same procedure was completed for the L3-L4 interspace here on the lateral aspect there was noted to be a significant cystic growth from the superior articular process of L4 the cause compression in the foramen of the L3-4 region.  This caused some compression of the exiting L3 nerve root and this was decompressed hemostasis was similarly achieved laterally we noted that there was a rather hard mass that was not consistent with any soft disc material the nerve was decompressed dorsally and laterally and once this decompression was noted to be intact and circumferential the dissection was stopped another half cc of half percent Marcaine was left in the area here and after achieving adequate hemostasis the retractor was removed the wound was closed with 2-0 Vicryl in the subcutaneous tissues and 3-0 Vicryl subcuticularly Dermabond was placed on the skin.  Next we took down the drapes from the lumbar spine and redraped the cervical spine after prepping with DuraPrep.  Fluoroscopy Skippy was used to localize the C5-C6 space and a midline incision was created after infiltrating with 10 cc of lidocaine with epinephrine and half percent Marcaine a K wire was then passed to the laminar arch at C5-C6 on the right side similarly and a #10 blade was used to open the fascia onto the right side and a series of dilators was passed using a winding technique to allow distraction to an 18 mm diameter.  A 6 sonometer Metrix cannula was then affixed to the operating table placed into this aperture to allow visualization of the C5-6 space microscope was brought into the field and soft tissues overlying the facet joint at C5-6 was taken down.  A laminotomy and foraminotomy was then created in the lateral aspect of the lamina and the mesial facetectomy was performed to expose the common dural tube and the path of the C6 nerve root as  it entered into the foramen.  Foraminotomies was generously enlarged to allow good decompression of the C6 nerve root once this was verified and the nerve root could be sounded easily out the foramen hemostasis was achieved.  The retractor was removed and the similar procedure was carried out to the left side where a similar process was encountered with significant hypertrophy of the superior articular process of C6 creating stenosis for the foramen itself once this area was decompressed and hemostasis was achieved the Metrix retractor was removed 10 cc of half percent Marcaine was injected into the paraspinous tissues and the fascia was closed with 3-0 Vicryl interrupted fashion 3-0 Vicryl was used in subcuticular tissues Dermabond was placed on the skin patient was then carefully removed from the 3 pin headrest and turned supine onto the stretcher to be awakened and returned to the recovery room.  Blood loss was estimated less than 50 cc for both procedures

## 2021-10-30 NOTE — Anesthesia Postprocedure Evaluation (Signed)
Anesthesia Post Note  Patient: ZAMARIYA NEAL  Procedure(s) Performed: Bilateral Cervical Five-Six Foraminotomy w/Metrx (Bilateral: Back) Right Lumbar Three-Four Lumbar Four-Five Laminotomy, Discectomy with metrx (Right: Back)     Patient location during evaluation: PACU Anesthesia Type: General Level of consciousness: sedated Pain management: pain level controlled Vital Signs Assessment: post-procedure vital signs reviewed and stable Respiratory status: spontaneous breathing and respiratory function stable Cardiovascular status: stable Postop Assessment: no apparent nausea or vomiting Anesthetic complications: no   No notable events documented.  Last Vitals:  Vitals:   10/30/21 1720 10/30/21 1735  BP: 120/76 109/76  Pulse: 96 96  Resp: 15 16  Temp:    SpO2: 95% 99%    Last Pain:  Vitals:   10/30/21 1735  TempSrc:   PainSc: 2                  Tamirah George DANIEL

## 2021-10-31 ENCOUNTER — Encounter (HOSPITAL_COMMUNITY): Payer: Self-pay | Admitting: Neurological Surgery

## 2021-10-31 DIAGNOSIS — M4722 Other spondylosis with radiculopathy, cervical region: Secondary | ICD-10-CM | POA: Diagnosis not present

## 2021-10-31 MED ORDER — METHOCARBAMOL 500 MG PO TABS: 500.0000 mg | ORAL_TABLET | Freq: Four times a day (QID) | ORAL | 3 refills | Status: DC

## 2021-10-31 MED ORDER — OXYCODONE-ACETAMINOPHEN 5-325 MG PO TABS: 1.0000 | ORAL_TABLET | ORAL | 0 refills | Status: DC | PRN

## 2021-10-31 NOTE — Discharge Summary (Signed)
Physician Discharge Summary  Patient ID: Toni Lewis MRN: [id number] DOB/AGE: [id number] y.o.  Admit date: 10/30/2021 Discharge date: 10/31/2021  Admission Diagnoses: Cervical radiculopathy C6 bilaterally, right L3 and L4 lumbar radiculopathy secondary to spondylosis.  History of cervical arthroplasty.  Discharge Diagnoses: Cervical radiculopathy C6 bilaterally secondary to spondylosis.  Right L3 and L4 radiculopathy secondary to spondylosis.  History of cervical arthroplasty C5-C6. Principal Problem:   Cervical disc disorder with radiculopathy of cervical region   Discharged Condition: good  Hospital Course: Patient was admitted to undergo surgical decompression using a Metrix retractor technique.  She tolerated surgery well.  She has had some numbness on the left side in her leg but otherwise is functioning well neurologically.  Consults: None  Significant Diagnostic Studies: None  Treatments: surgery: See op note  Discharge Exam: Blood pressure 114/77, pulse (!) 109, temperature 98.7 F (37.1 C), temperature source Oral, resp. rate 16, height 5\' 6"  (1.676 m), weight 113.4 kg, SpO2 99 %. Incisions are clean and dry motor function is intact.  Disposition: Discharge disposition: 01-Home or Self Care       Discharge Instructions     Call MD for:  redness, tenderness, or signs of infection (pain, swelling, redness, odor or green/yellow discharge around incision site)   Complete by: As directed    Call MD for:  severe uncontrolled pain   Complete by: As directed    Call MD for:  temperature >100.4   Complete by: As directed    Diet - low sodium heart healthy   Complete by: As directed    Discharge wound care:   Complete by: As directed    Okay to shower. Do not apply salves or appointments to incision. No heavy lifting with the upper extremities greater than 10 pounds. May resume driving when not requiring pain medication and patient feels comfortable with doing so.    Incentive spirometry RT   Complete by: As directed    Increase activity slowly   Complete by: As directed       Allergies as of 10/31/2021       Reactions   Vicodin [hydrocodone-acetaminophen] Nausea And Vomiting        Medication List     TAKE these medications    amitriptyline 100 MG tablet Commonly known as: ELAVIL Take 100 mg by mouth at bedtime.   benzonatate 200 MG capsule Commonly known as: TESSALON Take 1 capsule (200 mg total) by mouth 2 (two) times daily as needed for cough.   losartan-hydrochlorothiazide 50-12.5 MG tablet Commonly known as: HYZAAR Take 1 tablet by mouth daily.   methocarbamol 500 MG tablet Commonly known as: Robaxin Take 1 tablet (500 mg total) by mouth 4 (four) times daily.   multivitamin with minerals Tabs tablet Take 1 tablet by mouth daily.   oxyCODONE-acetaminophen 5-325 MG tablet Commonly known as: PERCOCET/ROXICET Take 1-2 tablets by mouth every 4 (four) hours as needed for moderate pain or severe pain.   predniSONE 20 MG tablet Commonly known as: Deltasone Take 1 tablet (20 mg total) by mouth 2 (two) times daily with a meal.   pregabalin 50 MG capsule Commonly known as: LYRICA Take 50 mg by mouth 2 (two) times daily.   pyridoxine 100 MG tablet Commonly known as: B-6 Take 100 mg by mouth daily.   venlafaxine XR 150 MG 24 hr capsule Commonly known as: EFFEXOR-XR Take 150 mg by mouth daily with breakfast.   vitamin B-12 500 MCG tablet Commonly known as: CYANOCOBALAMIN  Take 500 mcg by mouth daily.               Discharge Care Instructions  (From admission, onward)           Start     Ordered   10/31/21 0000  Discharge wound care:       Comments: Okay to shower. Do not apply salves or appointments to incision. No heavy lifting with the upper extremities greater than 10 pounds. May resume driving when not requiring pain medication and patient feels comfortable with doing so.   10/31/21 0801              Signed: Shary Key Leray Garverick 10/31/2021, 8:01 AM

## 2021-10-31 NOTE — Evaluation (Signed)
Occupational Therapy Evaluation Patient Details Name: Toni Lewis MRN: [id number] DOB: 11-28-1965 Today's Date: 10/31/2021   History of Present Illness 56 yo female s/p bilat C5-6 foraminotomy, R L3-4 laminotomy, discectomy on 1/12. PMH includes L5-S1 fusion, anxiety, depression, endometriosis, HTN.   Clinical Impression   Patient admitted for the procedures above.  PTA she lives with her mother, who can assist as needed.  Patient has expected post op pain, but was able to complete ADL with hip kit from sit/stand level.  No further needs in the acute setting.  All precautions reviewed with good understanding.  All questions answered.        Recommendations for follow up therapy are one component of a multi-disciplinary discharge planning process, led by the attending physician.  Recommendations may be updated based on patient status, additional functional criteria and insurance authorization.   Follow Up Recommendations  No OT follow up    Assistance Recommended at Discharge Intermittent Supervision/Assistance  Patient can return home with the following      Functional Status Assessment  Patient has had a recent decline in their functional status and demonstrates the ability to make significant improvements in function in a reasonable and predictable amount of time.  Equipment Recommendations  None recommended by OT    Recommendations for Other Services       Precautions / Restrictions Precautions Precautions: Fall;Back;Cervical Precaution Booklet Issued: Yes (comment) Precaution Comments: Issued by PT, reviewed again by OT Restrictions Weight Bearing Restrictions: No      Mobility Bed Mobility Overal bed mobility: Needs Assistance Bed Mobility: Rolling;Sidelying to Sit Rolling: Min guard Sidelying to sit: Min guard       General bed mobility comments: sitting EOB    Transfers Overall transfer level: Needs assistance Equipment used: None Transfers: Sit to/from  Stand Sit to Stand: Supervision           General transfer comment: for safety, slow to rise and steady. Cues for bending from hips/knees not back      Balance Overall balance assessment: Needs assistance Sitting-balance support: No upper extremity supported;Feet supported Sitting balance-Leahy Scale: Good     Standing balance support: No upper extremity supported;During functional activity Standing balance-Leahy Scale: Fair Standing balance comment: reaches for support dynamically                           ADL either performed or assessed with clinical judgement   ADL                                         General ADL Comments: Able to use hip kit for lower body dressing with demo and cues.     Vision Baseline Vision/History: 1 Wears glasses Patient Visual Report: No change from baseline       Perception Perception Perception: Not tested   Praxis Praxis Praxis: Not tested    Pertinent Vitals/Pain Pain Assessment: Faces Pain Score: 10-Worst pain ever Faces Pain Scale: Hurts little more Pain Location: back, neck Pain Descriptors / Indicators: Sore;Discomfort;Tender Pain Intervention(s): Monitored during session     Hand Dominance Right   Extremity/Trunk Assessment Upper Extremity Assessment Upper Extremity Assessment: Overall WFL for tasks assessed   Lower Extremity Assessment Lower Extremity Assessment: Defer to PT evaluation   Cervical / Trunk Assessment Cervical / Trunk Assessment: Back Surgery;Neck Surgery   Communication Communication  Communication: No difficulties   Cognition Arousal/Alertness: Awake/alert Behavior During Therapy: WFL for tasks assessed/performed Overall Cognitive Status: Within Functional Limits for tasks assessed                                       General Comments   VSS on RA    Exercises Other Exercises Other Exercises: home walking program: PT encouraged up and walking  1x/hour during waking hours to decrease stiffness, promote strength maintenance and circulation   Shoulder Instructions      Home Living Family/patient expects to be discharged to:: Private residence Living Arrangements: Parent;Other relatives Available Help at Discharge: Family;Available 24 hours/day Type of Home: House Home Access: Ramped entrance     Home Layout: One level     Bathroom Shower/Tub: Teacher, early years/pre: Standard Bathroom Accessibility: Yes How Accessible: Accessible via walker Home Equipment: None          Prior Functioning/Environment Prior Level of Function : Independent/Modified Independent                        OT Problem List: Impaired balance (sitting and/or standing);Pain      OT Treatment/Interventions:      OT Goals(Current goals can be found in the care plan section) Acute Rehab OT Goals Patient Stated Goal: Return home OT Goal Formulation: With patient Time For Goal Achievement: 11/03/21 Potential to Achieve Goals: Good  OT Frequency:      Co-evaluation              AM-PAC OT "6 Clicks" Daily Activity     Outcome Measure Help from another person eating meals?: None Help from another person taking care of personal grooming?: None Help from another person toileting, which includes using toliet, bedpan, or urinal?: None Help from another person bathing (including washing, rinsing, drying)?: A Little Help from another person to put on and taking off regular upper body clothing?: None Help from another person to put on and taking off regular lower body clothing?: A Little 6 Click Score: 22   End of Session Equipment Utilized During Treatment: Rolling walker (2 wheels) Nurse Communication: Mobility status  Activity Tolerance: Patient tolerated treatment well Patient left: in bed;with call bell/phone within reach;with family/visitor present  OT Visit Diagnosis: Unsteadiness on feet (R26.81)                 Time: 9179-1505 OT Time Calculation (min): 25 min Charges:  OT General Charges $OT Visit: 1 Visit OT Evaluation $OT Eval Moderate Complexity: 1 Mod OT Treatments $Self Care/Home Management : 8-22 mins  10/31/2021  RP, OTR/L  Acute Rehabilitation Services  Office:  [phone]   Metta Clines 10/31/2021, 8:58 AM

## 2021-10-31 NOTE — Progress Notes (Signed)
Explained discharge instructions to patient and her mother. Neck collar was delivered by the Ortho Tech.  A walker was provided for the patient.  She did not have any questions. She was transported for discharge by volunteer.

## 2021-10-31 NOTE — TOC Progression Note (Signed)
Transition of Care [facility]) - Progression Note    Patient Details  Name: Toni Lewis MRN: [id number] Date of Birth: [birth year]  [facility] CM/SW Contact  Nadene Rubins Adria Devon, RN Phone Number: 10/31/2021, 10:45 AM  Clinical Narrative:     Went to see patient. Patient already discharged and left hospital. 3C staff provided patient with walker.   Received a call from Katrina with [employer] fax [phone].   She would like op report , PT notes, DC summary and order for walker faxed to her. Same done         Expected Discharge Plan and Services           Expected Discharge Date: 10/31/21                                     Social Determinants of Health (SDOH) Interventions    Readmission Risk Interventions No flowsheet data found.

## 2021-10-31 NOTE — Evaluation (Signed)
Physical Therapy Evaluation Patient Details Name: Toni Lewis MRN: [id number] DOB: [birth year] Today's Date: 10/31/2021  History of Present Illness  56 yo female s/p bilat C5-6 foraminotomy, R L3-4 laminotomy, discectomy on 1/12. PMH includes L5-S1 fusion, anxiety, depression, endometriosis, HTN.  Clinical Impression   Pt presents with generalized weakness, impaired dynamic standing balance, decreased knowledge and application of spinal precautions, and decreased activity tolerance vs baseline. Pt to benefit from acute PT to address deficits. Pt ambulated hallway distance with use of RW for steadying otherwise pt reaching for environment to self-steady. Pt's mom present during session, supportive and will provide assist as needed at home. PT to progress mobility as tolerated, and will continue to follow acutely.         Recommendations for follow up therapy are one component of a multi-disciplinary discharge planning process, led by the attending physician.  Recommendations may be updated based on patient status, additional functional criteria and insurance authorization.  Follow Up Recommendations Follow physician's recommendations for discharge plan and follow up therapies    Assistance Recommended at Discharge Intermittent Supervision/Assistance  Patient can return home with the following  A lot of help with bathing/dressing/bathroom;Assist for transportation    Equipment Recommendations Rolling walker (2 wheels)  Recommendations for Other Services       Functional Status Assessment Patient has had a recent decline in their functional status and demonstrates the ability to make significant improvements in function in a reasonable and predictable amount of time.     Precautions / Restrictions Precautions Precautions: Fall;Back;Cervical Precaution Booklet Issued: Yes (comment) Precaution Comments: No brace needed per chart, pt states Dr. Ellene Route will supply a soft collar given neck  pain. PT reviewed BLT rules, as well as cervical precautions at start and throughout session Restrictions Weight Bearing Restrictions: No      Mobility  Bed Mobility Overal bed mobility: Needs Assistance Bed Mobility: Rolling;Sidelying to Sit Rolling: Min guard Sidelying to sit: Min guard       General bed mobility comments: cues for sequencing log roll technique, increased time with use of bedrail    Transfers Overall transfer level: Needs assistance Equipment used: None Transfers: Sit to/from Stand Sit to Stand: Supervision           General transfer comment: for safety, slow to rise and steady. Cues for bending from hips/knees not back    Ambulation/Gait Ambulation/Gait assistance: Supervision Gait Distance (Feet): 125 Feet Assistive device: None;Rolling walker (2 wheels) Gait Pattern/deviations: Step-through pattern;Decreased stride length;Trunk flexed Gait velocity: decr     General Gait Details: Cues for upright posture, pt reaching for walls with min knee buckling without RW, PT placed pt on RW for safety and balance  Stairs            Wheelchair Mobility    Modified Rankin (Stroke Patients Only)       Balance Overall balance assessment: Needs assistance Sitting-balance support: No upper extremity supported;Feet supported Sitting balance-Leahy Scale: Good     Standing balance support: No upper extremity supported;During functional activity Standing balance-Leahy Scale: Fair Standing balance comment: reaches for support dynamically                             Pertinent Vitals/Pain Pain Assessment: 0-10 Pain Score: 10-Worst pain ever Pain Location: back, neck Pain Descriptors / Indicators: Sore;Spasm;Discomfort Pain Intervention(s): Limited activity within patient's tolerance;Monitored during session;Repositioned    Home Living Family/patient expects to be discharged to::  Private residence Living Arrangements: Parent;Other  relatives (sister PRN) Available Help at Discharge: Family;Available 24 hours/day Type of Home: House Home Access: Ramped entrance       Home Layout: One level Home Equipment: None      Prior Function Prior Level of Function : Independent/Modified Independent                     Hand Dominance   Dominant Hand: Right    Extremity/Trunk Assessment   Upper Extremity Assessment Upper Extremity Assessment: Defer to OT evaluation    Lower Extremity Assessment Lower Extremity Assessment: Generalized weakness    Cervical / Trunk Assessment Cervical / Trunk Assessment: Back Surgery;Neck Surgery  Communication   Communication: No difficulties  Cognition Arousal/Alertness: Awake/alert Behavior During Therapy: WFL for tasks assessed/performed Overall Cognitive Status: Within Functional Limits for tasks assessed                                          General Comments      Exercises Other Exercises Other Exercises: home walking program: PT encouraged up and walking 1x/hour during waking hours to decrease stiffness, promote strength maintenance and circulation   Assessment/Plan    PT Assessment Patient needs continued PT services  PT Problem List Decreased mobility;Decreased activity tolerance;Decreased balance;Decreased knowledge of use of DME;Pain;Decreased safety awareness;Decreased knowledge of precautions;Decreased strength       PT Treatment Interventions DME instruction;Gait training;Therapeutic exercise;Patient/family education;Therapeutic activities;Balance training;Functional mobility training    PT Goals (Current goals can be found in the Care Plan section)  Acute Rehab PT Goals Patient Stated Goal: home PT Goal Formulation: With patient Time For Goal Achievement: 11/07/21 Potential to Achieve Goals: Good    Frequency Min 5X/week     Co-evaluation               AM-PAC PT "6 Clicks" Mobility  Outcome Measure Help needed  turning from your back to your side while in a flat bed without using bedrails?: A Little Help needed moving from lying on your back to sitting on the side of a flat bed without using bedrails?: A Little Help needed moving to and from a bed to a chair (including a wheelchair)?: A Little Help needed standing up from a chair using your arms (e.g., wheelchair or bedside chair)?: A Little Help needed to walk in hospital room?: A Little Help needed climbing 3-5 steps with a railing? : A Little 6 Click Score: 18    End of Session   Activity Tolerance: Patient tolerated treatment well Patient left: in bed;with call bell/phone within reach;with family/visitor present Nurse Communication: Mobility status PT Visit Diagnosis: Other abnormalities of gait and mobility (R26.89);Muscle weakness (generalized) (M62.81)    Time: [id number] PT Time Calculation (min) (ACUTE ONLY): 21 min   Charges:   PT Evaluation $PT Eval Low Complexity: 1 Low         Kunio Cummiskey S, PT DPT Acute Rehabilitation Services Pager [phone]  Office [phone]   [place] E Ruffin Pyo 10/31/2021, 8:54 AM

## 2021-11-25 ENCOUNTER — Institutional Professional Consult (permissible substitution): Payer: BC Managed Care – PPO | Admitting: Internal Medicine

## 2022-02-19 ENCOUNTER — Other Ambulatory Visit (HOSPITAL_COMMUNITY): Payer: Self-pay | Admitting: Neurological Surgery

## 2022-02-19 ENCOUNTER — Other Ambulatory Visit: Payer: Self-pay | Admitting: Neurological Surgery

## 2022-02-19 DIAGNOSIS — M5412 Radiculopathy, cervical region: Secondary | ICD-10-CM

## 2022-02-24 ENCOUNTER — Other Ambulatory Visit (HOSPITAL_COMMUNITY): Payer: Self-pay | Admitting: Neurological Surgery

## 2022-02-24 ENCOUNTER — Other Ambulatory Visit: Payer: Self-pay | Admitting: Neurological Surgery

## 2022-02-24 ENCOUNTER — Ambulatory Visit (HOSPITAL_COMMUNITY)
Admission: RE | Admit: 2022-02-24 | Discharge: 2022-02-24 | Disposition: A | Discharge status: Home / Self Care | Payer: No Typology Code available for payment source | Source: Ambulatory Visit | Attending: Neurological Surgery | Admitting: Neurological Surgery

## 2022-02-24 DIAGNOSIS — M5412 Radiculopathy, cervical region: Secondary | ICD-10-CM | POA: Diagnosis present

## 2022-02-24 DIAGNOSIS — G959 Disease of spinal cord, unspecified: Secondary | ICD-10-CM

## 2022-02-24 DIAGNOSIS — M5416 Radiculopathy, lumbar region: Secondary | ICD-10-CM

## 2022-02-24 MED ORDER — IOHEXOL 300 MG/ML  SOLN
75.0000 mL | Freq: Once | INTRAMUSCULAR | Status: AC | PRN
  Administered 2022-02-24: 75 mL via INTRAVENOUS

## 2022-03-11 ENCOUNTER — Other Ambulatory Visit: Payer: Self-pay

## 2022-03-11 ENCOUNTER — Ambulatory Visit (HOSPITAL_COMMUNITY)
Admission: RE | Admit: 2022-03-11 | Discharge: 2022-03-11 | Disposition: A | Discharge status: Home / Self Care | Payer: No Typology Code available for payment source | Source: Ambulatory Visit | Attending: Neurological Surgery | Admitting: Neurological Surgery

## 2022-03-11 DIAGNOSIS — M5416 Radiculopathy, lumbar region: Secondary | ICD-10-CM | POA: Insufficient documentation

## 2022-03-11 DIAGNOSIS — G959 Disease of spinal cord, unspecified: Secondary | ICD-10-CM | POA: Insufficient documentation

## 2022-03-11 DIAGNOSIS — M5412 Radiculopathy, cervical region: Secondary | ICD-10-CM

## 2022-03-11 MED ORDER — ONDANSETRON HCL 4 MG/2ML IJ SOLN: 4.0000 mg | Freq: Four times a day (QID) | INTRAMUSCULAR | Status: DC | PRN

## 2022-03-11 MED ORDER — DIAZEPAM 5 MG PO TABS: 10.0000 mg | ORAL_TABLET | Freq: Once | ORAL | Status: AC

## 2022-03-11 MED ORDER — HYDROCODONE-ACETAMINOPHEN 5-325 MG PO TABS: 1.0000 | ORAL_TABLET | ORAL | Status: DC | PRN

## 2022-03-11 MED ORDER — DIAZEPAM 5 MG PO TABS
ORAL_TABLET | ORAL | Status: AC
  Administered 2022-03-11: 10 mg via ORAL
  Filled 2022-03-11: qty 2

## 2022-03-11 MED ORDER — LIDOCAINE HCL (PF) 1 % IJ SOLN: 5.0000 mL | Freq: Once | INTRAMUSCULAR | Status: DC

## 2022-03-11 MED ORDER — IOHEXOL 300 MG/ML  SOLN
10.0000 mL | Freq: Once | INTRAMUSCULAR | Status: AC | PRN
  Administered 2022-03-11: 10 mL via INTRATHECAL

## 2022-03-11 NOTE — Progress Notes (Signed)
Spoke with Dr Danielle Dess r/t to dc orders-states pt may be d/c at 11am

## 2022-03-11 NOTE — Procedures (Signed)
Toni Lewis is a 56 year old individual whose had significant spondylitic disease and has had a previous arthrodesis at the level of L5-S1 lumbar spine is also had cervical disc arthroplasty but has some recurrent cervical radicular pain she underwent posterior decompression in the extraforaminal zone at L3-4 and then again in the cervical spine.  Foraminotomies but she is having persistent bilateral radiculopathy in the upper extremities and in the lower extremity on the right side.  She has been advised regarding myelography to better evaluate this process.  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  Modest adjacent level disease noted in the lumbar spine.  Cervical disc arthroplasties in good position with no obstruction to the flow of dye however significant metallic artifact for further evaluation with CT scanning of the cervical and lumbar spines.

## 2023-03-09 NOTE — Therapy (Signed)
OUTPATIENT PHYSICAL THERAPY THORACOLUMBAR EVALUATION   Patient Name: Toni Lewis MRN: [id number] DOB:04-11-1966, 57 y.o., female Today's Date: 03/09/2023  END OF SESSION:   Past Medical History:  Diagnosis Date   Anal fissure    Anxiety    Depression    Endometriosis    Headache    Hemorrhoids    Hypertension    Peptic ulcer disease    Past Surgical History:  Procedure Laterality Date   COLONOSCOPY  2022   ENDOMETRIAL ABLATION     LUMBAR LAMINECTOMY/ DECOMPRESSION WITH MET-RX Right 10/30/2021   Procedure: Right Lumbar Three-Four Lumbar Four-Five Laminotomy, Discectomy with metrx;  Surgeon: Barnett Abu, MD;  Location: [facility] OR;  Service: Neurosurgery;  Laterality: Right;   POSTERIOR CERVICAL LAMINECTOMY WITH MET- RX Bilateral 10/30/2021   Procedure: Bilateral Cervical Five-Six Foraminotomy w/Metrx;  Surgeon: Barnett Abu, MD;  Location: [facility] OR;  Service: Neurosurgery;  Laterality: Bilateral;   TONSILLECTOMY     Patient Active Problem List   Diagnosis Date Noted   Cervical disc disorder with radiculopathy of cervical region 10/30/2021   Benign essential HTN 02/01/2014   Chest pain 02/01/2014   Syncope, vasovagal 02/01/2014   Palpitations 02/01/2014    PCP: Greta Doom, MD   REFERRING PROVIDER: Greta Doom, MD   REFERRING DIAG:  M54.9 (ICD-10-CM) - Dorsalgia, unspecified   Rationale for Evaluation and Treatment: Rehabilitation  THERAPY DIAG:  No diagnosis found.  ONSET DATE: ***  SUBJECTIVE:                                                                                                                                                                                           SUBJECTIVE STATEMENT: ***  PERTINENT HISTORY:  ***  PAIN:  Are you having pain? {OPRCPAIN:27236}  PRECAUTIONS: {Therapy precautions:24002}  WEIGHT BEARING RESTRICTIONS: {Yes ***/No:24003}  FALLS:  Has patient fallen in last 6 months? {fallsyesno:27318}  LIVING  ENVIRONMENT: Lives with: {OPRC lives with:25569::"lives with their family"} Lives in: {Lives in:25570} Stairs: {opstairs:27293} Has following equipment at home: {Assistive devices:23999}  OCCUPATION: ***  PLOF: {PLOF:24004}  PATIENT GOALS: ***  NEXT MD VISIT: ***  OBJECTIVE:   DIAGNOSTIC FINDINGS:  ***  PATIENT SURVEYS:  {rehab surveys:24030}  SCREENING FOR RED FLAGS: Bowel or bladder incontinence: {Yes/No:304960894} Spinal tumors: {Yes/No:304960894} Cauda equina syndrome: {Yes/No:304960894} Compression fracture: {Yes/No:304960894} Abdominal aneurysm: {Yes/No:304960894}  COGNITION: Overall cognitive status: {cognition:24006}     SENSATION: {sensation:27233}  MUSCLE LENGTH: Hamstrings: Right *** deg; Left *** deg Thomas test: Right *** deg; Left *** deg  POSTURE: {posture:25561}  PALPATION: ***  LUMBAR ROM:   AROM eval  Flexion   Extension  Right lateral flexion   Left lateral flexion   Right rotation   Left rotation    (Blank rows = not tested)  LOWER EXTREMITY ROM:     {AROM/PROM:27142}  Right eval Left eval  Hip flexion    Hip extension    Hip abduction    Hip adduction    Hip internal rotation    Hip external rotation    Knee flexion    Knee extension    Ankle dorsiflexion    Ankle plantarflexion    Ankle inversion    Ankle eversion     (Blank rows = not tested)  LOWER EXTREMITY MMT:    MMT Right eval Left eval  Hip flexion    Hip extension    Hip abduction    Hip adduction    Hip internal rotation    Hip external rotation    Knee flexion    Knee extension    Ankle dorsiflexion    Ankle plantarflexion    Ankle inversion    Ankle eversion     (Blank rows = not tested)  LUMBAR SPECIAL TESTS:  {lumbar special test:25242}  FUNCTIONAL TESTS:  {Functional tests:24029}  GAIT: Distance walked: *** Assistive device utilized: {Assistive devices:23999} Level of assistance: {Levels of assistance:24026} Comments:  ***  TODAY'S TREATMENT:                                                                                                                              DATE: ***    PATIENT EDUCATION:  Education details: *** Person educated: {Person educated:25204} Education method: {Education Method:25205} Education comprehension: {Education Comprehension:25206}  HOME EXERCISE PROGRAM: ***  ASSESSMENT:  CLINICAL IMPRESSION: Patient is a *** y.o. *** who was seen today for physical therapy evaluation and treatment for ***.   OBJECTIVE IMPAIRMENTS: {opptimpairments:25111}.   ACTIVITY LIMITATIONS: {activitylimitations:27494}  PARTICIPATION LIMITATIONS: {participationrestrictions:25113}  PERSONAL FACTORS: {Personal factors:25162} are also affecting patient's functional outcome.   REHAB POTENTIAL: {rehabpotential:25112}  CLINICAL DECISION MAKING: {clinical decision making:25114}  EVALUATION COMPLEXITY: {Evaluation complexity:25115}   GOALS: Goals reviewed with patient? {yes/no:20286}  SHORT TERM GOALS: Target date: ***  *** Baseline: Goal status: {GOALSTATUS:25110}  2.  *** Baseline:  Goal status: {GOALSTATUS:25110}  3.  *** Baseline:  Goal status: {GOALSTATUS:25110}  4.  *** Baseline:  Goal status: {GOALSTATUS:25110}  5.  *** Baseline:  Goal status: {GOALSTATUS:25110}  6.  *** Baseline:  Goal status: {GOALSTATUS:25110}  LONG TERM GOALS: Target date: ***  *** Baseline:  Goal status: {GOALSTATUS:25110}  2.  *** Baseline:  Goal status: {GOALSTATUS:25110}  3.  *** Baseline:  Goal status: {GOALSTATUS:25110}  4.  *** Baseline:  Goal status: {GOALSTATUS:25110}  5.  *** Baseline:  Goal status: {GOALSTATUS:25110}  6.  *** Baseline:  Goal status: {GOALSTATUS:25110}  PLAN:  PT FREQUENCY: {rehab frequency:25116}  PT DURATION: {rehab duration:25117}  PLANNED INTERVENTIONS: {rehab planned interventions:25118::"Therapeutic exercises","Therapeutic  activity","Neuromuscular re-education","Balance training","Gait training","Patient/Family education","Self Care","Joint mobilization"}.  PLAN FOR NEXT SESSION: ***  Geni Bers, PT 03/09/2023, 6:20 PM

## 2023-03-10 ENCOUNTER — Encounter (HOSPITAL_BASED_OUTPATIENT_CLINIC_OR_DEPARTMENT_OTHER): Payer: Self-pay | Admitting: Physical Therapy

## 2023-03-10 ENCOUNTER — Ambulatory Visit (HOSPITAL_BASED_OUTPATIENT_CLINIC_OR_DEPARTMENT_OTHER): Payer: No Typology Code available for payment source | Attending: Internal Medicine | Admitting: Physical Therapy

## 2023-03-10 ENCOUNTER — Other Ambulatory Visit: Payer: Self-pay

## 2023-03-10 DIAGNOSIS — M6281 Muscle weakness (generalized): Secondary | ICD-10-CM | POA: Insufficient documentation

## 2023-03-10 DIAGNOSIS — M549 Dorsalgia, unspecified: Secondary | ICD-10-CM | POA: Diagnosis present

## 2023-03-10 DIAGNOSIS — R293 Abnormal posture: Secondary | ICD-10-CM | POA: Diagnosis present

## 2023-03-12 ENCOUNTER — Ambulatory Visit (HOSPITAL_BASED_OUTPATIENT_CLINIC_OR_DEPARTMENT_OTHER): Payer: No Typology Code available for payment source | Admitting: Physical Therapy

## 2023-03-12 DIAGNOSIS — M549 Dorsalgia, unspecified: Secondary | ICD-10-CM

## 2023-03-12 DIAGNOSIS — M6281 Muscle weakness (generalized): Secondary | ICD-10-CM

## 2023-03-12 DIAGNOSIS — R293 Abnormal posture: Secondary | ICD-10-CM

## 2023-03-12 NOTE — Therapy (Signed)
OUTPATIENT PHYSICAL THERAPY [facility] TREATMENT    Department of [facility]  [id number] 15 visits approved from 12/18/22-04/17/23 [facility] Contact: Ardelia Mems [phone] Patient Name: Toni Lewis MRN: [id number] DOB:November 30, 1965, 57 y.o., female Today's Date: 03/12/2023  END OF SESSION:  PT End of Session - 03/12/23 1452     Visit Number 2    Number of Visits 15    Date for PT Re-Evaluation 05/05/23    Authorization Type [facility]    PT Start Time 1431    PT Stop Time 1515    PT Time Calculation (min) 44 min    Activity Tolerance Patient limited by pain    Behavior During Therapy [facility] for tasks assessed/performed             Past Medical History:  Diagnosis Date   Anal fissure    Anxiety    Depression    Endometriosis    Headache    Hemorrhoids    Hypertension    Peptic ulcer disease    Past Surgical History:  Procedure Laterality Date   COLONOSCOPY  2022   ENDOMETRIAL ABLATION     LUMBAR LAMINECTOMY/ DECOMPRESSION WITH MET-RX Right 10/30/2021   Procedure: Right Lumbar Three-Four Lumbar Four-Five Laminotomy, Discectomy with metrx;  Surgeon: Barnett Abu, MD;  Location: [facility] OR;  Service: Neurosurgery;  Laterality: Right;   POSTERIOR CERVICAL LAMINECTOMY WITH MET- RX Bilateral 10/30/2021   Procedure: Bilateral Cervical Five-Six Foraminotomy w/Metrx;  Surgeon: Barnett Abu, MD;  Location: [facility] OR;  Service: Neurosurgery;  Laterality: Bilateral;   TONSILLECTOMY     Patient Active Problem List   Diagnosis Date Noted   Cervical disc disorder with radiculopathy of cervical region 10/30/2021   Benign essential HTN 02/01/2014   Chest pain 02/01/2014   Syncope, vasovagal 02/01/2014   Palpitations 02/01/2014    PCP: Greta Doom, MD   REFERRING PROVIDER: Greta Doom, MD   REFERRING DIAG:  M54.9 (ICD-10-CM) - Dorsalgia, unspecified   Rationale for Evaluation and Treatment: Rehabilitation  THERAPY DIAG:  Dorsalgia of multiple sites in spine  Abnormal  posture  Muscle weakness-general  ONSET DATE: >10 yrs  SUBJECTIVE:                                                                                                                                                                                           SUBJECTIVE STATEMENT: Pt reports no new changes since evaluation.  She states she has had busy morning with appt at [facility] this morning.    PERTINENT HISTORY:  10/30/21: Surgeon Barnett Abu     LUMBAR LAMINECTOMY/ DECOMPRESSION       POSTERIOR  CERVICAL LAMINECTOMY  PAIN:  Are you having pain? Yes: NPRS scale: current 10/10; worst 10/10 Pain location: general  Pain description: N&T arms; right foot numbness, mid back to neck sharp Aggravating factors: walking, standing, showering, toileting Relieving factors: nothing right now; meds; ice packs  PRECAUTIONS: Cervical and Back  WEIGHT BEARING RESTRICTIONS: No  FALLS:  Has patient fallen in last 6 months? No  LIVING ENVIRONMENT: Lives with: lives with their family Lives in: House/apartment Stairs: No Has following equipment at home: Single point cane  OCCUPATION: disabled  PLOF: Needs assistance with ADLs  PATIENT GOALS: decrease pain, improve QOL, walk through grocery store  NEXT MD VISIT: pain management Friday  OBJECTIVE:     PATIENT SURVEYS:  FOTO Primary score 28% with goal of 34%  SCREENING FOR RED FLAGS: No  COGNITION: Overall cognitive status: Within functional limits for tasks assessed     SENSATION: P&N throughout ue to hands and le through feet  MUSCLE LENGTH: Hamstrings: full knee extension in sitting   POSTURE: rounded, elevated shoulders, forward head, and flexed trunk   PALPATION: Ms tightness and TTP throughout traps, post cervical spine, erector sp through lumbar spine   Cervical ROM: extension -10d; flex 40d; rotation R/L ~20d LUMBAR ROM:   AROM eval  Flexion 25%  Extension -20d  Right lateral flexion 25%  Left lateral flexion 25%   Right rotation   Left rotation    (Blank rows = not tested)  LOWER Extremity  WFL  LOWER EXTREMITY MMT:      Strength testing limited by pain MMT Right eval Left eval  Hip flexion 3 3  Hip extension    Hip abduction 3 3  Hip adduction    Hip internal rotation    Hip external rotation    Knee flexion    Knee extension 3 3  Ankle dorsiflexion    Ankle plantarflexion    Ankle inversion    Ankle eversion     (Blank rows = not tested)    FUNCTIONAL TESTS:  5 times sit to stand: unable to tolerate Timed up and go (TUG): 21.61 Berg Balance Scale: 22/56  GAIT: Distance walked: 200 Assistive device utilized: None Level of assistance: Modified independence Comments: Forward flex positioning, short step length, limited hip/knee flex with heel strike, slowed cadence, guarded  TODAY'S TREATMENT:                                                                                                                              Pt seen for aquatic therapy today.  Treatment took place in water 3.5-4.75 ft in depth at the [facility] pool. Temp of water was 91.  Pt entered/exited the pool via stairs in step-to pattern with bilat rail. * holding barbell: walking forward - multiple laps, backward walking near wall (short distance) * holding wall: side stepping R/L, heel raises x 5; relaxed squats x 5  * back against wall:  shoulder horiz  addct/ abdct; shoulder addct/ abdct - arms under surface ~50 flex/abdct * suspended in deeper water with noodle under arms Pt requires the buoyancy and hydrostatic pressure of water for support, and to offload joints by unweighting joint load by at least 50 % in navel deep water and by at least 75-80% in chest to neck deep water.  Viscosity of the water is needed for resistance of strengthening. Water current perturbations provides challenge to standing balance requiring increased core activation.     PATIENT EDUCATION:  Education details: intro  to aquatic therapy   Person educated: Patient and Caregiver mother Education method: Explanation Education comprehension: verbalized understanding  HOME EXERCISE PROGRAM: tba  ASSESSMENT:  CLINICAL IMPRESSION: Pt guarded during session; requires UE support on floatation device and encouragement. She moves at a slow, cautious pace throughout.  Pt able to take direction from therapist on deck.  She demonstrates improved posture while working in 14ft water with increased time and cues.  She reports near end of session when submerge to clavicals that her pain decreased to 5/10.  Goals are ongoing.    From eval Patient is a 57 y.o. f who was seen today for physical therapy evaluation and treatment for LBP. She has extensive spinal surgical hx with recurrent radicular pain upper and lower extremities.  She presents today with high pain sensitivity throughout back and extremities. She is greatly limited in cervical through lumbar ROM and does not tolerate strength testing well.  She is a high fall risk.  She is [facility] ask [facility] for a rollator, shower chair and grab bars for commode as she reports difficulty with functional mobility and ADL's. She will benefit from skilled physical therapy intervention to decrease muscle tightness, improve ROM, decrease pain and improve balance using aquatic therapy medium initially. Re-assessments in future will better assess limitations/ability with further focus on land based PT for improving strength by increased loading for improvement of functional mobility, ADL's and overall QOL.  OBJECTIVE IMPAIRMENTS: Abnormal gait, decreased activity tolerance, decreased balance, decreased endurance, decreased mobility, difficulty walking, decreased ROM, decreased strength, impaired sensation, improper body mechanics, postural dysfunction, obesity, and pain.   ACTIVITY LIMITATIONS: carrying, lifting, bending, sitting, standing, squatting, sleeping, stairs, transfers, bed  mobility, bathing, toileting, reach over head, locomotion level, and caring for others  PARTICIPATION LIMITATIONS: meal prep, cleaning, laundry, driving, shopping, community activity, occupation, and yard work  PERSONAL FACTORS: Fitness, Past/current experiences, and Time since onset of injury/illness/exacerbation are also affecting patient's functional outcome.   REHAB POTENTIAL: Good  CLINICAL DECISION MAKING: Evolving/moderate complexity  EVALUATION COMPLEXITY: Moderate   GOALS: Goals reviewed with patient? Yes  SHORT TERM GOALS: Target date: 04/16/23  Pt will tolerate full aquatic sessions consistently without increase in pain and with improving function to demonstrate good toleration and effectiveness of intervention.   Baseline: Goal status: INITIAL  2.  Pt will improve on Tug test to <or=  15s to demonstrate improvement in lower extremity function, mobility and decreased fall risk.  Baseline:21.61  Goal status: INITIAL  3.  Pt will obtain DME's as needed to improve safety with ADL's and amb Baseline: needs: rollator; shower chair and grab bars for toilet Goal status: INITIAL  4.  Pt will have a decrease in her minimal pain to </= 4/10 Baseline: 8/10 Goal status: INITIAL  5.  Pt will report completing showering with assist and apprehensiveness of falling Baseline: afraid of falling/not wanting to ask for help Goal status: INITIAL    LONG TERM GOALS:  Target date: 05/05/23  Pt to meet stated Foto Goal of 34% Baseline: 28% Goal status: INITIAL  2.  Pt will improve strength in all areas listed by  1 grade (MMT, plan to measure using HHD when tolerated)  to demonstrate improved overall physical function  Baseline: see chart Goal status: INITIAL  3.  Pt will improve on Berg balance test to >/=  50/56 to demonstrate a decrease in fall risk. Baseline: 22/56 Goal status: INITIAL  4.  Pt will tolerate walking to and from setting and engaging in aquatic therapy session  without excessive fatigue or increase in pain to demonstrate improved toleration to activity.  Baseline: pain and multiple rest periods Goal status: INITIAL  5.  Pt will be able to tolerate walking through grocery store without increase in pain and without seated rest period to demonstrate improved toleration to activity Baseline: unable (personal goal) Goal status: INITIAL  6.  Pt will have an overall decrease in max pain to </= 4/10 for improved QOL Baseline: 8/10 Goal status: INITIAL  PLAN:  PT FREQUENCY: 1-2x/week  PT DURATION: 8 weeks 15 visits  PLANNED INTERVENTIONS: Therapeutic exercises, Therapeutic activity, Neuromuscular re-education, Balance training, Gait training, Patient/Family education, Self Care, Joint mobilization, Stair training, Orthotic/Fit training, DME instructions, Aquatic Therapy, Dry Needling, Cryotherapy, Moist heat, Taping, Ionotophoresis 4mg /ml Dexamethasone, Manual therapy, and Re-evaluation.  PLAN FOR NEXT SESSION: aquatic intervention for general strengthening and mobility, stretching/ROM, pain reduction, posture correction, gait cervical through lumbar spine.  Mayer Camel, PTA 03/12/23 5:00 PM [facility] [address] [place], [place], [address] Phone: [phone]   Fax:  [phone]

## 2023-03-16 ENCOUNTER — Ambulatory Visit (HOSPITAL_BASED_OUTPATIENT_CLINIC_OR_DEPARTMENT_OTHER): Payer: No Typology Code available for payment source | Admitting: Physical Therapy

## 2023-03-16 ENCOUNTER — Encounter (HOSPITAL_BASED_OUTPATIENT_CLINIC_OR_DEPARTMENT_OTHER): Payer: Self-pay | Admitting: Physical Therapy

## 2023-03-16 DIAGNOSIS — M549 Dorsalgia, unspecified: Secondary | ICD-10-CM

## 2023-03-16 DIAGNOSIS — R293 Abnormal posture: Secondary | ICD-10-CM

## 2023-03-16 DIAGNOSIS — M6281 Muscle weakness (generalized): Secondary | ICD-10-CM

## 2023-03-16 NOTE — Therapy (Signed)
OUTPATIENT PHYSICAL THERAPY [facility] TREATMENT    Department of [facility]  [id number] 15 visits approved from 12/18/22-04/17/23 [facility] Contact: Ardelia Mems [phone] Patient Name: Toni Lewis MRN: [id number] DOB:05/30/1966, 57 y.o., female Today's Date: 03/16/2023  END OF SESSION:  PT End of Session - 03/16/23 0904     Visit Number 3    Number of Visits 15    Date for PT Re-Evaluation 05/05/23    Authorization Type [facility]    PT Start Time 0858    PT Stop Time 0943    PT Time Calculation (min) 45 min    Activity Tolerance Patient limited by pain    Behavior During Therapy [facility] for tasks assessed/performed             Past Medical History:  Diagnosis Date   Anal fissure    Anxiety    Depression    Endometriosis    Headache    Hemorrhoids    Hypertension    Peptic ulcer disease    Past Surgical History:  Procedure Laterality Date   COLONOSCOPY  2022   ENDOMETRIAL ABLATION     LUMBAR LAMINECTOMY/ DECOMPRESSION WITH MET-RX Right 10/30/2021   Procedure: Right Lumbar Three-Four Lumbar Four-Five Laminotomy, Discectomy with metrx;  Surgeon: Barnett Abu, MD;  Location: [facility] OR;  Service: Neurosurgery;  Laterality: Right;   POSTERIOR CERVICAL LAMINECTOMY WITH MET- RX Bilateral 10/30/2021   Procedure: Bilateral Cervical Five-Six Foraminotomy w/Metrx;  Surgeon: Barnett Abu, MD;  Location: [facility] OR;  Service: Neurosurgery;  Laterality: Bilateral;   TONSILLECTOMY     Patient Active Problem List   Diagnosis Date Noted   Cervical disc disorder with radiculopathy of cervical region 10/30/2021   Benign essential HTN 02/01/2014   Chest pain 02/01/2014   Syncope, vasovagal 02/01/2014   Palpitations 02/01/2014    PCP: Greta Doom, MD   REFERRING PROVIDER: Greta Doom, MD   REFERRING DIAG:  M54.9 (ICD-10-CM) - Dorsalgia, unspecified   Rationale for Evaluation and Treatment: Rehabilitation  THERAPY DIAG:  Dorsalgia of multiple sites in spine  Abnormal  posture  Muscle weakness-general  ONSET DATE: >10 yrs  SUBJECTIVE:                                                                                                                                                                                           SUBJECTIVE STATEMENT: Pt reports her pain last session went down to a 5/10 which was the lowest it has been since she can remember   PERTINENT HISTORY:  10/30/21: Surgeon Barnett Abu     LUMBAR LAMINECTOMY/ DECOMPRESSION       POSTERIOR  CERVICAL LAMINECTOMY  PAIN:  Are you having pain? Yes: NPRS scale: current 8/10; worst 10/10 Pain location: general  Pain description: N&T arms; right foot numbness, mid back to neck sharp Aggravating factors: walking, standing, showering, toileting Relieving factors: nothing right now; meds; ice packs  PRECAUTIONS: Cervical and Back  WEIGHT BEARING RESTRICTIONS: No  FALLS:  Has patient fallen in last 6 months? No  LIVING ENVIRONMENT: Lives with: lives with their family Lives in: House/apartment Stairs: No Has following equipment at home: Single point cane  OCCUPATION: disabled  PLOF: Needs assistance with ADLs  PATIENT GOALS: decrease pain, improve QOL, walk through grocery store  NEXT MD VISIT: pain management Friday  OBJECTIVE:     PATIENT SURVEYS:  FOTO Primary score 28% with goal of 34%  SCREENING FOR RED FLAGS: No  COGNITION: Overall cognitive status: Within functional limits for tasks assessed     SENSATION: P&N throughout ue to hands and le through feet  MUSCLE LENGTH: Hamstrings: full knee extension in sitting   POSTURE: rounded, elevated shoulders, forward head, and flexed trunk   PALPATION: Ms tightness and TTP throughout traps, post cervical spine, erector sp through lumbar spine   Cervical ROM: extension -10d; flex 40d; rotation R/L ~20d LUMBAR ROM:   AROM eval  Flexion 25%  Extension -20d  Right lateral flexion 25%  Left lateral flexion 25%   Right rotation   Left rotation    (Blank rows = not tested)  LOWER Extremity  WFL  LOWER EXTREMITY MMT:      Strength testing limited by pain MMT Right eval Left eval  Hip flexion 3 3  Hip extension    Hip abduction 3 3  Hip adduction    Hip internal rotation    Hip external rotation    Knee flexion    Knee extension 3 3  Ankle dorsiflexion    Ankle plantarflexion    Ankle inversion    Ankle eversion     (Blank rows = not tested)    FUNCTIONAL TESTS:  5 times sit to stand: unable to tolerate Timed up and go (TUG): 21.61 Berg Balance Scale: 22/56  GAIT: Distance walked: 200 Assistive device utilized: None Level of assistance: Modified independence Comments: Forward flex positioning, short step length, limited hip/knee flex with heel strike, slowed cadence, guarded  TODAY'S TREATMENT:                                                                                                                              Pt seen for aquatic therapy today.  Treatment took place in water 3.5-4.75 ft in depth at the [facility] pool. Temp of water was 91.  Pt entered/exited the pool via stairs in step-to pattern with bilat rail.  * holding barbell then progressed to unsupported: walking forward and backward - multiple laps. Cues for arm swing, decreased shoulder elevation, general relaxation to decrease overall bosy guarding *Standing: shoulder horizontal abd/add; row cues for  shoulder retraction; triceps press *Passive and active assisted cervical ROM.  Slight overpressure with trigger pint massage SCM, sub occipitals. *gait training with head movements.  Pt requires the buoyancy and hydrostatic pressure of water for support, and to offload joints by unweighting joint load by at least 50 % in navel deep water and by at least 75-80% in chest to neck deep water.  Viscosity of the water is needed for resistance of strengthening. Water current perturbations provides challenge to  standing balance requiring increased core activation.     PATIENT EDUCATION:  Education details: intro to aquatic therapy   Person educated: Patient and Caregiver mother Education method: Explanation Education comprehension: verbalized understanding  HOME EXERCISE PROGRAM: Access Code: [email or web address] URL: [email or web address] Date: 03/16/2023 Prepared by: Geni Bers  Exercises - Seated Cervical Rotation AROM  - 1 x daily - 7 x weekly - 3 sets - 10 reps - Seated Cervical Extension AROM  - 1 x daily - 7 x weekly - 3 sets - 10 reps - Seated Passive Cervical Retraction  - 1 x daily - 7 x weekly - 3 sets - 10 reps - Seated Scapular Retraction  - 1 x daily - 7 x weekly - 3 sets - 10 reps  ASSESSMENT:  CLINICAL IMPRESSION: Decreased guarding with amb and movement submerged.  Able to amb without ue support, adding arm swing which improves posture and relaxes cervical spine and upper back. Focus today on cervical spine and upper/middle trap Rom stretching.  Pt given HEP. She does report increase in cervical discomfort upon completion of session.     From eval Patient is a 57 y.o. f who was seen today for physical therapy evaluation and treatment for LBP. She has extensive spinal surgical hx with recurrent radicular pain upper and lower extremities.  She presents today with high pain sensitivity throughout back and extremities. She is greatly limited in cervical through lumbar ROM and does not tolerate strength testing well.  She is a high fall risk.  She is encouraged to ask [facility] for a rollator, shower chair and grab bars for commode as she reports difficulty with functional mobility and ADL's. She will benefit from skilled physical therapy intervention to decrease muscle tightness, improve ROM, decrease pain and improve balance using aquatic therapy medium initially. Re-assessments in future will better assess limitations/ability with further focus on land based PT for improving  strength by increased loading for improvement of functional mobility, ADL's and overall QOL.  OBJECTIVE IMPAIRMENTS: Abnormal gait, decreased activity tolerance, decreased balance, decreased endurance, decreased mobility, difficulty walking, decreased ROM, decreased strength, impaired sensation, improper body mechanics, postural dysfunction, obesity, and pain.   ACTIVITY LIMITATIONS: carrying, lifting, bending, sitting, standing, squatting, sleeping, stairs, transfers, bed mobility, bathing, toileting, reach over head, locomotion level, and caring for others  PARTICIPATION LIMITATIONS: meal prep, cleaning, laundry, driving, shopping, community activity, occupation, and yard work  PERSONAL FACTORS: Fitness, Past/current experiences, and Time since onset of injury/illness/exacerbation are also affecting patient's functional outcome.   REHAB POTENTIAL: Good  CLINICAL DECISION MAKING: Evolving/moderate complexity  EVALUATION COMPLEXITY: Moderate   GOALS: Goals reviewed with patient? Yes  SHORT TERM GOALS: Target date: 04/16/23  Pt will tolerate full aquatic sessions consistently without increase in pain and with improving function to demonstrate good toleration and effectiveness of intervention.   Baseline: Goal status: INITIAL  2.  Pt will improve on Tug test to <or=  15s to demonstrate improvement in lower extremity function, mobility and decreased fall risk.  Baseline:21.61  Goal status: INITIAL  3.  Pt will obtain DME's as needed to improve safety with ADL's and amb Baseline: needs: rollator; shower chair and grab bars for toilet Goal status: INITIAL  4.  Pt will have a decrease in her minimal pain to </= 4/10 Baseline: 8/10 Goal status: INITIAL  5.  Pt will report completing showering with assist and apprehensiveness of falling Baseline: afraid of falling/not wanting to ask for help Goal status: INITIAL    LONG TERM GOALS: Target date: 05/05/23  Pt to meet stated Foto  Goal of 34% Baseline: 28% Goal status: INITIAL  2.  Pt will improve strength in all areas listed by  1 grade (MMT, plan to measure using HHD when tolerated)  to demonstrate improved overall physical function  Baseline: see chart Goal status: INITIAL  3.  Pt will improve on Berg balance test to >/=  50/56 to demonstrate a decrease in fall risk. Baseline: 22/56 Goal status: INITIAL  4.  Pt will tolerate walking to and from setting and engaging in aquatic therapy session without excessive fatigue or increase in pain to demonstrate improved toleration to activity.  Baseline: pain and multiple rest periods Goal status: INITIAL  5.  Pt will be able to tolerate walking through grocery store without increase in pain and without seated rest period to demonstrate improved toleration to activity Baseline: unable (personal goal) Goal status: INITIAL  6.  Pt will have an overall decrease in max pain to </= 4/10 for improved QOL Baseline: 8/10 Goal status: INITIAL  PLAN:  PT FREQUENCY: 1-2x/week  PT DURATION: 8 weeks 15 visits  PLANNED INTERVENTIONS: Therapeutic exercises, Therapeutic activity, Neuromuscular re-education, Balance training, Gait training, Patient/Family education, Self Care, Joint mobilization, Stair training, Orthotic/Fit training, DME instructions, Aquatic Therapy, Dry Needling, Cryotherapy, Moist heat, Taping, Ionotophoresis 4mg /ml Dexamethasone, Manual therapy, and Re-evaluation.  PLAN FOR NEXT SESSION: aquatic intervention for general strengthening and mobility, stretching/ROM, pain reduction, posture correction, gait cervical through lumbar spine.   [address] [place]) Jonita Hirota MPT 03/16/23 9:07 AM [facility] [address] [place], [place], [address] Phone: [phone]   Fax:  [phone]

## 2023-03-19 ENCOUNTER — Ambulatory Visit (HOSPITAL_BASED_OUTPATIENT_CLINIC_OR_DEPARTMENT_OTHER): Payer: No Typology Code available for payment source | Admitting: Physical Therapy

## 2023-03-19 ENCOUNTER — Encounter (HOSPITAL_BASED_OUTPATIENT_CLINIC_OR_DEPARTMENT_OTHER): Payer: Self-pay | Admitting: Physical Therapy

## 2023-03-19 DIAGNOSIS — M549 Dorsalgia, unspecified: Secondary | ICD-10-CM

## 2023-03-19 DIAGNOSIS — M6281 Muscle weakness (generalized): Secondary | ICD-10-CM

## 2023-03-19 DIAGNOSIS — R293 Abnormal posture: Secondary | ICD-10-CM

## 2023-03-19 NOTE — Therapy (Signed)
OUTPATIENT PHYSICAL THERAPY [facility] TREATMENT    Department of [facility]  [id number] 15 visits approved from 12/18/22-04/17/23 [facility] Contact: Ardelia Mems [phone] Patient Name: Toni Lewis MRN: [id number] DOB:11-Jan-1966, 57 y.o., female Today's Date: 03/19/2023  END OF SESSION:  PT End of Session - 03/19/23 0951     Visit Number 4    Number of Visits 15    Date for PT Re-Evaluation 05/05/23    [facility]ization Type [facility]    PT Start Time [phone]    PT Stop Time 1030    PT Time Calculation (min) 44 min    Activity Tolerance Patient limited by pain    Behavior During Therapy [facility] for tasks assessed/performed             Past Medical History:  Diagnosis Date   Anal fissure    Anxiety    Depression    Endometriosis    Headache    Hemorrhoids    Hypertension    Peptic ulcer disease    Past Surgical History:  Procedure Laterality Date   COLONOSCOPY  2022   ENDOMETRIAL ABLATION     LUMBAR LAMINECTOMY/ DECOMPRESSION WITH MET-RX Right 10/30/2021   Procedure: Right Lumbar Three-Four Lumbar Four-Five Laminotomy, Discectomy with metrx;  Surgeon: Barnett Abu, MD;  Location: [facility] OR;  Service: Neurosurgery;  Laterality: Right;   POSTERIOR CERVICAL LAMINECTOMY WITH MET- RX Bilateral 10/30/2021   Procedure: Bilateral Cervical Five-Six Foraminotomy w/Metrx;  Surgeon: Barnett Abu, MD;  Location: [facility] OR;  Service: Neurosurgery;  Laterality: Bilateral;   TONSILLECTOMY     Patient Active Problem List   Diagnosis Date Noted   Cervical disc disorder with radiculopathy of cervical region 10/30/2021   Benign essential HTN 02/01/2014   Chest pain 02/01/2014   Syncope, vasovagal 02/01/2014   Palpitations 02/01/2014    PCP: Greta Doom, MD   REFERRING PROVIDER: Greta Doom, MD   REFERRING DIAG:  M54.9 (ICD-10-CM) - Dorsalgia, unspecified   Rationale for Evaluation and Treatment: Rehabilitation  THERAPY DIAG:  Dorsalgia of multiple sites in spine  Abnormal  posture  Muscle weakness-general  ONSET DATE: >10 yrs  SUBJECTIVE:                                                                                                                                                                                           SUBJECTIVE STATEMENT: Pt reports increase in pain in cervical spine and head which radiated down to feet after last session.  Reports completing only a few of the exercises on HEP.  PERTINENT HISTORY:  10/30/21: Surgeon Barnett Abu     LUMBAR LAMINECTOMY/ DECOMPRESSION  POSTERIOR CERVICAL LAMINECTOMY  PAIN:  Are you having pain? Yes: NPRS scale: current 8/10; worst 10/10 Pain location: general  Pain description: N&T arms; right foot numbness, mid back to neck sharp Aggravating factors: walking, standing, showering, toileting Relieving factors: nothing right now; meds; ice packs  PRECAUTIONS: Cervical and Back  WEIGHT BEARING RESTRICTIONS: No  FALLS:  Has patient fallen in last 6 months? No  LIVING ENVIRONMENT: Lives with: lives with their family Lives in: House/apartment Stairs: No Has following equipment at home: Single point cane  OCCUPATION: disabled  PLOF: Needs assistance with ADLs  PATIENT GOALS: decrease pain, improve QOL, walk through grocery store  NEXT MD VISIT: pain management Friday  OBJECTIVE:     PATIENT SURVEYS:  FOTO Primary score 28% with goal of 34%  SCREENING FOR RED FLAGS: No  COGNITION: Overall cognitive status: Within functional limits for tasks assessed     SENSATION: P&N throughout ue to hands and le through feet  MUSCLE LENGTH: Hamstrings: full knee extension in sitting   POSTURE: rounded, elevated shoulders, forward head, and flexed trunk   PALPATION: Ms tightness and TTP throughout traps, post cervical spine, erector sp through lumbar spine   Cervical ROM: extension -10d; flex 40d; rotation R/L ~20d LUMBAR ROM:   AROM eval  Flexion 25%  Extension -20d  Right  lateral flexion 25%  Left lateral flexion 25%  Right rotation   Left rotation    (Blank rows = not tested)  LOWER Extremity  WFL  LOWER EXTREMITY MMT:      Strength testing limited by pain MMT Right eval Left eval  Hip flexion 3 3  Hip extension    Hip abduction 3 3  Hip adduction    Hip internal rotation    Hip external rotation    Knee flexion    Knee extension 3 3  Ankle dorsiflexion    Ankle plantarflexion    Ankle inversion    Ankle eversion     (Blank rows = not tested)    FUNCTIONAL TESTS:  5 times sit to stand: unable to tolerate Timed up and go (TUG): 21.61 Berg Balance Scale: 22/56  GAIT: Distance walked: 200 Assistive device utilized: None Level of assistance: Modified independence Comments: Forward flex positioning, short step length, limited hip/knee flex with heel strike, slowed cadence, guarded  TODAY'S TREATMENT:                                                                                                                              Pt seen for aquatic therapy today.  Treatment took place in water 3.5-4.75 ft in depth at the [facility] pool. Temp of water was 91.  Pt entered/exited the pool via stairs in step-to pattern with bilat rail.  * holding barbell then progressed to unsupported: walking forward and backward - multiple laps. Cues for arm swing, decreased shoulder elevation, general relaxation to decrease overall bosy guarding *decompression using yellow noodle posteriorly wrapped  around chest in deep water: requires ue support on wall to maintain position Standing noodle under arms and hands on wall for support: high knee marching; toe raises; heel raises *abdominal bracing in standing x5 10s hold *Standing: shoulder horiz addct/ abdct 10; shoulder addct/ abdct (not tolerated) *decompression for pain relief: completed gentle cervical ROM all planes    PATIENT EDUCATION:  Education details: intro to aquatic therapy   Person  educated: Patient and Caregiver mother Education method: Explanation Education comprehension: verbalized understanding  HOME EXERCISE PROGRAM: Access Code: [email or web address] URL: [email or web address] Date: 03/16/2023 Prepared by: Geni Bers  Exercises - Seated Cervical Rotation AROM  - 1 x daily - 7 x weekly - 3 sets - 10 reps - Seated Cervical Extension AROM  - 1 x daily - 7 x weekly - 3 sets - 10 reps - Seated Passive Cervical Retraction  - 1 x daily - 7 x weekly - 3 sets - 10 reps - Seated Scapular Retraction  - 1 x daily - 7 x weekly - 3 sets - 10 reps  ASSESSMENT:  CLINICAL IMPRESSION: Dialed back session due to pain response after last session.  She continues to demonstrate slight decrease in guarding with amb and additionally with cervical ROM. Of note greatest limitation is in side bending.  Decompression in deep water relieves pain sensitivity in LB.  She is instructed to continue with the cervical ROM instructed last session but without any overpressure. She VU.  LB pain reduction 1-2 NPRS. Goals ongoing    .  From eval Patient is a 57 y.o. f who was seen today for physical therapy evaluation and treatment for LBP. She has extensive spinal surgical hx with recurrent radicular pain upper and lower extremities.  She presents today with high pain sensitivity throughout back and extremities. She is greatly limited in cervical through lumbar ROM and does not tolerate strength testing well.  She is a high fall risk.  She is encouraged to ask [facility] for a rollator, shower chair and grab bars for commode as she reports difficulty with functional mobility and ADL's. She will benefit from skilled physical therapy intervention to decrease muscle tightness, improve ROM, decrease pain and improve balance using aquatic therapy medium initially. Re-assessments in future will better assess limitations/ability with further focus on land based PT for improving strength by increased loading for  improvement of functional mobility, ADL's and overall QOL.  OBJECTIVE IMPAIRMENTS: Abnormal gait, decreased activity tolerance, decreased balance, decreased endurance, decreased mobility, difficulty walking, decreased ROM, decreased strength, impaired sensation, improper body mechanics, postural dysfunction, obesity, and pain.   ACTIVITY LIMITATIONS: carrying, lifting, bending, sitting, standing, squatting, sleeping, stairs, transfers, bed mobility, bathing, toileting, reach over head, locomotion level, and caring for others  PARTICIPATION LIMITATIONS: meal prep, cleaning, laundry, driving, shopping, community activity, occupation, and yard work  PERSONAL FACTORS: Fitness, Past/current experiences, and Time since onset of injury/illness/exacerbation are also affecting patient's functional outcome.   REHAB POTENTIAL: Good  CLINICAL DECISION MAKING: Evolving/moderate complexity  EVALUATION COMPLEXITY: Moderate   GOALS: Goals reviewed with patient? Yes  SHORT TERM GOALS: Target date: 04/16/23  Pt will tolerate full aquatic sessions consistently without increase in pain and with improving function to demonstrate good toleration and effectiveness of intervention.   Baseline: Goal status: INITIAL  2.  Pt will improve on Tug test to <or=  15s to demonstrate improvement in lower extremity function, mobility and decreased fall risk.  Baseline:21.61  Goal status: INITIAL  3.  Pt will obtain DME's as  needed to improve safety with ADL's and amb Baseline: needs: rollator; shower chair and grab bars for toilet Goal status: INITIAL  4.  Pt will have a decrease in her minimal pain to </= 4/10 Baseline: 8/10 Goal status: INITIAL  5.  Pt will report completing showering with assist and apprehensiveness of falling Baseline: afraid of falling/not wanting to ask for help Goal status: INITIAL    LONG TERM GOALS: Target date: 05/05/23  Pt to meet stated Foto Goal of 34% Baseline: 28% Goal  status: INITIAL  2.  Pt will improve strength in all areas listed by  1 grade (MMT, plan to measure using HHD when tolerated)  to demonstrate improved overall physical function  Baseline: see chart Goal status: INITIAL  3.  Pt will improve on Berg balance test to >/=  50/56 to demonstrate a decrease in fall risk. Baseline: 22/56 Goal status: INITIAL  4.  Pt will tolerate walking to and from setting and engaging in aquatic therapy session without excessive fatigue or increase in pain to demonstrate improved toleration to activity.  Baseline: pain and multiple rest periods Goal status: INITIAL  5.  Pt will be able to tolerate walking through grocery store without increase in pain and without seated rest period to demonstrate improved toleration to activity Baseline: unable (personal goal) Goal status: INITIAL  6.  Pt will have an overall decrease in max pain to </= 4/10 for improved QOL Baseline: 8/10 Goal status: INITIAL  PLAN:  PT FREQUENCY: 1-2x/week  PT DURATION: 8 weeks 15 visits  PLANNED INTERVENTIONS: Therapeutic exercises, Therapeutic activity, Neuromuscular re-education, Balance training, Gait training, Patient/Family education, Self Care, Joint mobilization, Stair training, Orthotic/Fit training, DME instructions, Aquatic Therapy, Dry Needling, Cryotherapy, Moist heat, Taping, Ionotophoresis 4mg /ml Dexamethasone, Manual therapy, and Re-evaluation.  PLAN FOR NEXT SESSION: aquatic intervention for general strengthening and mobility, stretching/ROM, pain reduction, posture correction, gait cervical through lumbar spine.   Corrie D[place]) Chadwick Reiswig MPT 03/19/23 9:53 AM [facility] [address] [place], [place], [address] Phone: [phone]   Fax:  [phone]

## 2023-03-23 ENCOUNTER — Encounter (HOSPITAL_BASED_OUTPATIENT_CLINIC_OR_DEPARTMENT_OTHER): Payer: Self-pay | Admitting: Physical Therapy

## 2023-03-23 ENCOUNTER — Ambulatory Visit (HOSPITAL_BASED_OUTPATIENT_CLINIC_OR_DEPARTMENT_OTHER): Payer: No Typology Code available for payment source | Attending: Internal Medicine | Admitting: Physical Therapy

## 2023-03-23 DIAGNOSIS — M6281 Muscle weakness (generalized): Secondary | ICD-10-CM | POA: Diagnosis present

## 2023-03-23 DIAGNOSIS — M549 Dorsalgia, unspecified: Secondary | ICD-10-CM | POA: Insufficient documentation

## 2023-03-23 DIAGNOSIS — R293 Abnormal posture: Secondary | ICD-10-CM | POA: Insufficient documentation

## 2023-03-23 NOTE — Therapy (Signed)
OUTPATIENT PHYSICAL THERAPY [facility] TREATMENT    Department of [facility]  [id number] 15 visits approved from 12/18/22-04/17/23 [facility] Contact: Ardelia Mems [phone] Patient Name: Toni Lewis MRN: [id number] DOB:04/11/1966, 57 y.o., female Today's Date: 03/23/2023  END OF SESSION:  PT End of Session - 03/23/23 1155     Visit Number 5    Number of Visits 15    Date for PT Re-Evaluation 05/05/23    Authorization Type [facility]    PT Start Time 1156    PT Stop Time 1240    PT Time Calculation (min) 44 min    Activity Tolerance Patient limited by pain    Behavior During Therapy [facility] for tasks assessed/performed             Past Medical History:  Diagnosis Date   Anal fissure    Anxiety    Depression    Endometriosis    Headache    Hemorrhoids    Hypertension    Peptic ulcer disease    Past Surgical History:  Procedure Laterality Date   COLONOSCOPY  2022   ENDOMETRIAL ABLATION     LUMBAR LAMINECTOMY/ DECOMPRESSION WITH MET-RX Right 10/30/2021   Procedure: Right Lumbar Three-Four Lumbar Four-Five Laminotomy, Discectomy with metrx;  Surgeon: Barnett Abu, MD;  Location: [facility] OR;  Service: Neurosurgery;  Laterality: Right;   POSTERIOR CERVICAL LAMINECTOMY WITH MET- RX Bilateral 10/30/2021   Procedure: Bilateral Cervical Five-Six Foraminotomy w/Metrx;  Surgeon: Barnett Abu, MD;  Location: [facility] OR;  Service: Neurosurgery;  Laterality: Bilateral;   TONSILLECTOMY     Patient Active Problem List   Diagnosis Date Noted   Cervical disc disorder with radiculopathy of cervical region 10/30/2021   Benign essential HTN 02/01/2014   Chest pain 02/01/2014   Syncope, vasovagal 02/01/2014   Palpitations 02/01/2014    PCP: Greta Doom, MD   REFERRING PROVIDER: Greta Doom, MD   REFERRING DIAG:  M54.9 (ICD-10-CM) - Dorsalgia, unspecified   Rationale for Evaluation and Treatment: Rehabilitation  THERAPY DIAG:  Dorsalgia of multiple sites in spine  Abnormal  posture  Muscle weakness-general  ONSET DATE: >10 yrs  SUBJECTIVE:                                                                                                                                                                                           SUBJECTIVE STATEMENT: Pt reports going to [employer] this morning and walking x ~20 minutes  PERTINENT HISTORY:  10/30/21: Surgeon Barnett Abu     LUMBAR LAMINECTOMY/ DECOMPRESSION       POSTERIOR CERVICAL LAMINECTOMY  PAIN:  Are you having pain? Yes:  NPRS scale: current 10/10; worst 10/10 Pain location: general  Pain description: N&T arms; right foot numbness, mid back to neck sharp Aggravating factors: walking, standing, showering, toileting Relieving factors: nothing right now; meds; ice packs  PRECAUTIONS: Cervical and Back  WEIGHT BEARING RESTRICTIONS: No  FALLS:  Has patient fallen in last 6 months? No  LIVING ENVIRONMENT: Lives with: lives with their family Lives in: House/apartment Stairs: No Has following equipment at home: Single point cane  OCCUPATION: disabled  PLOF: Needs assistance with ADLs  PATIENT GOALS: decrease pain, improve QOL, walk through grocery store  NEXT MD VISIT: pain management Friday  OBJECTIVE:     PATIENT SURVEYS:  FOTO Primary score 28% with goal of 34%  SCREENING FOR RED FLAGS: No  COGNITION: Overall cognitive status: Within functional limits for tasks assessed     SENSATION: P&N throughout ue to hands and le through feet  MUSCLE LENGTH: Hamstrings: full knee extension in sitting   POSTURE: rounded, elevated shoulders, forward head, and flexed trunk   PALPATION: Ms tightness and TTP throughout traps, post cervical spine, erector sp through lumbar spine   Cervical ROM: extension -10d; flex 40d; rotation R/L ~20d LUMBAR ROM:   AROM eval  Flexion 25%  Extension -20d  Right lateral flexion 25%  Left lateral flexion 25%  Right rotation   Left rotation    (Blank  rows = not tested)  LOWER Extremity  WFL  LOWER EXTREMITY MMT:      Strength testing limited by pain MMT Right eval Left eval  Hip flexion 3 3  Hip extension    Hip abduction 3 3  Hip adduction    Hip internal rotation    Hip external rotation    Knee flexion    Knee extension 3 3  Ankle dorsiflexion    Ankle plantarflexion    Ankle inversion    Ankle eversion     (Blank rows = not tested)    FUNCTIONAL TESTS:  5 times sit to stand: unable to tolerate Timed up and go (TUG): 21.61 Berg Balance Scale: 22/56  GAIT: Distance walked: 200 Assistive device utilized: None Level of assistance: Modified independence Comments: Forward flex positioning, short step length, limited hip/knee flex with heel strike, slowed cadence, guarded  TODAY'S TREATMENT:                                                                                                                              Pt seen for aquatic therapy today.  Treatment took place in water 3.5-4.75 ft in depth at the [facility] pool. Temp of water was 91.  Pt entered/exited the pool via stairs in step-to pattern with bilat rail.  * holding barbell then progressed to unsupported: walking forward and backward - multiple laps. Cues for arm swing, decreased shoulder elevation *side stepping x 4 widths with shoulder add/abd *rest period in crouched position. *forward amb with cervical movements laterally then ant/post. *Cervical ROM rotation  and flex/ext *L stretch x 3 *decompression using yellow noodle posteriorly wrapped around chest in deep water:  *Standing: shoulder horiz addct/ abdct 10; shoulder depression; scapular retraction *Standing ue on wall for support: high knee marching; toe raises; heel raises *abdominal bracing in standing x5 10s hold;added Kegels. *decompression for pain relief    PATIENT EDUCATION:  Education details: intro to aquatic therapy   Person educated: Patient and Caregiver  mother Education method: Explanation Education comprehension: verbalized understanding  HOME EXERCISE PROGRAM: Access Code: [email or web address] URL: [email or web address] Date: 03/16/2023 Prepared by: Geni Bers  Exercises - Seated Cervical Rotation AROM  - 1 x daily - 7 x weekly - 3 sets - 10 reps - Seated Cervical Extension AROM  - 1 x daily - 7 x weekly - 3 sets - 10 reps - Seated Passive Cervical Retraction  - 1 x daily - 7 x weekly - 3 sets - 10 reps - Seated Scapular Retraction  - 1 x daily - 7 x weekly - 3 sets - 10 reps  ASSESSMENT:  CLINICAL IMPRESSION: Pt with improved response to last session without increase in pain sx.  She voices today that she can tell she is walking better submerged.  Given continue cues for relaxed posture. Progressed movement with decreased ue support and increased arm swing and head movements allowing for more fluid movement with amb.     .  From eval Patient is a 57 y.o. f who was seen today for physical therapy evaluation and treatment for LBP. She has extensive spinal surgical hx with recurrent radicular pain upper and lower extremities.  She presents today with high pain sensitivity throughout back and extremities. She is greatly limited in cervical through lumbar ROM and does not tolerate strength testing well.  She is a high fall risk.  She is encouraged to ask [facility] for a rollator, shower chair and grab bars for commode as she reports difficulty with functional mobility and ADL's. She will benefit from skilled physical therapy intervention to decrease muscle tightness, improve ROM, decrease pain and improve balance using aquatic therapy medium initially. Re-assessments in future will better assess limitations/ability with further focus on land based PT for improving strength by increased loading for improvement of functional mobility, ADL's and overall QOL.  OBJECTIVE IMPAIRMENTS: Abnormal gait, decreased activity tolerance, decreased balance,  decreased endurance, decreased mobility, difficulty walking, decreased ROM, decreased strength, impaired sensation, improper body mechanics, postural dysfunction, obesity, and pain.   ACTIVITY LIMITATIONS: carrying, lifting, bending, sitting, standing, squatting, sleeping, stairs, transfers, bed mobility, bathing, toileting, reach over head, locomotion level, and caring for others  PARTICIPATION LIMITATIONS: meal prep, cleaning, laundry, driving, shopping, community activity, occupation, and yard work  PERSONAL FACTORS: Fitness, Past/current experiences, and Time since onset of injury/illness/exacerbation are also affecting patient's functional outcome.   REHAB POTENTIAL: Good  CLINICAL DECISION MAKING: Evolving/moderate complexity  EVALUATION COMPLEXITY: Moderate   GOALS: Goals reviewed with patient? Yes  SHORT TERM GOALS: Target date: 04/16/23  Pt will tolerate full aquatic sessions consistently without increase in pain and with improving function to demonstrate good toleration and effectiveness of intervention.   Baseline: Goal status: INITIAL  2.  Pt will improve on Tug test to <or=  15s to demonstrate improvement in lower extremity function, mobility and decreased fall risk.  Baseline:21.61  Goal status: INITIAL  3.  Pt will obtain DME's as needed to improve safety with ADL's and amb Baseline: needs: rollator; shower chair and grab bars for toilet Goal status: INITIAL  4.  Pt will have a decrease in her minimal pain to </= 4/10 Baseline: 8/10 Goal status: INITIAL  5.  Pt will report completing showering with assist and apprehensiveness of falling Baseline: afraid of falling/not wanting to ask for help Goal status: INITIAL    LONG TERM GOALS: Target date: 05/05/23  Pt to meet stated Foto Goal of 34% Baseline: 28% Goal status: INITIAL  2.  Pt will improve strength in all areas listed by  1 grade (MMT, plan to measure using HHD when tolerated)  to demonstrate improved  overall physical function  Baseline: see chart Goal status: INITIAL  3.  Pt will improve on Berg balance test to >/=  50/56 to demonstrate a decrease in fall risk. Baseline: 22/56 Goal status: INITIAL  4.  Pt will tolerate walking to and from setting and engaging in aquatic therapy session without excessive fatigue or increase in pain to demonstrate improved toleration to activity.  Baseline: pain and multiple rest periods Goal status: INITIAL  5.  Pt will be able to tolerate walking through grocery store without increase in pain and without seated rest period to demonstrate improved toleration to activity Baseline: unable (personal goal) Goal status: INITIAL  6.  Pt will have an overall decrease in max pain to </= 4/10 for improved QOL Baseline: 8/10 Goal status: INITIAL  PLAN:  PT FREQUENCY: 1-2x/week  PT DURATION: 8 weeks 15 visits  PLANNED INTERVENTIONS: Therapeutic exercises, Therapeutic activity, Neuromuscular re-education, Balance training, Gait training, Patient/Family education, Self Care, Joint mobilization, Stair training, Orthotic/Fit training, DME instructions, Aquatic Therapy, Dry Needling, Cryotherapy, Moist heat, Taping, Ionotophoresis 4mg /ml Dexamethasone, Manual therapy, and Re-evaluation.  PLAN FOR NEXT SESSION: aquatic intervention for general strengthening and mobility, stretching/ROM, pain reduction, posture correction, gait cervical through lumbar spine.   [address] [place]) Lailana Shira MPT 03/23/23 1:08 PM [facility] [address] [place], [place], [address] Phone: [phone]   Fax:  [phone]

## 2023-03-25 ENCOUNTER — Ambulatory Visit (HOSPITAL_BASED_OUTPATIENT_CLINIC_OR_DEPARTMENT_OTHER): Payer: No Typology Code available for payment source | Admitting: Physical Therapy

## 2023-03-25 ENCOUNTER — Encounter (HOSPITAL_BASED_OUTPATIENT_CLINIC_OR_DEPARTMENT_OTHER): Payer: Self-pay | Admitting: Physical Therapy

## 2023-03-25 DIAGNOSIS — M549 Dorsalgia, unspecified: Secondary | ICD-10-CM

## 2023-03-25 DIAGNOSIS — R293 Abnormal posture: Secondary | ICD-10-CM

## 2023-03-25 DIAGNOSIS — M6281 Muscle weakness (generalized): Secondary | ICD-10-CM

## 2023-03-25 NOTE — Therapy (Signed)
OUTPATIENT PHYSICAL THERAPY [facility] TREATMENT    Department of [facility]  [id number] 15 visits approved from 12/18/22-04/17/23 [facility] Contact: Ardelia Mems [phone] Patient Name: Toni Lewis MRN: [id number] DOB:05/13/1966, 57 y.o., female Today's Date: 03/25/2023  END OF SESSION:  PT End of Session - 03/25/23 1117     Visit Number 6    Number of Visits 15    Date for PT Re-Evaluation 05/05/23    Authorization Type [facility]    PT Start Time 1117    PT Stop Time 1155    PT Time Calculation (min) 38 min    Behavior During Therapy [facility] for tasks assessed/performed             Past Medical History:  Diagnosis Date   Anal fissure    Anxiety    Depression    Endometriosis    Headache    Hemorrhoids    Hypertension    Peptic ulcer disease    Past Surgical History:  Procedure Laterality Date   COLONOSCOPY  2022   ENDOMETRIAL ABLATION     LUMBAR LAMINECTOMY/ DECOMPRESSION WITH MET-RX Right 10/30/2021   Procedure: Right Lumbar Three-Four Lumbar Four-Five Laminotomy, Discectomy with metrx;  Surgeon: Barnett Abu, MD;  Location: [facility] OR;  Service: Neurosurgery;  Laterality: Right;   POSTERIOR CERVICAL LAMINECTOMY WITH MET- RX Bilateral 10/30/2021   Procedure: Bilateral Cervical Five-Six Foraminotomy w/Metrx;  Surgeon: Barnett Abu, MD;  Location: [facility] OR;  Service: Neurosurgery;  Laterality: Bilateral;   TONSILLECTOMY     Patient Active Problem List   Diagnosis Date Noted   Cervical disc disorder with radiculopathy of cervical region 10/30/2021   Benign essential HTN 02/01/2014   Chest pain 02/01/2014   Syncope, vasovagal 02/01/2014   Palpitations 02/01/2014    PCP: Greta Doom, MD   REFERRING PROVIDER: Greta Doom, MD   REFERRING DIAG:  M54.9 (ICD-10-CM) - Dorsalgia, unspecified   Rationale for Evaluation and Treatment: Rehabilitation  THERAPY DIAG:  Dorsalgia of multiple sites in spine  Abnormal posture  Muscle weakness-general  ONSET  DATE: >10 yrs  SUBJECTIVE:                                                                                                                                                                                           SUBJECTIVE STATEMENT: Pt reports she drove herself today.  "I wish I could live in the water".   PERTINENT HISTORY:  10/30/21: Surgeon Barnett Abu     LUMBAR LAMINECTOMY/ DECOMPRESSION       POSTERIOR CERVICAL LAMINECTOMY  PAIN:  Are you having pain? Yes: NPRS scale: current 7-10/10; worst 10/10  Pain location: general  Pain description: N&T arms; right foot numbness, mid back to neck sharp Aggravating factors: walking, standing, showering, toileting Relieving factors: nothing right now; meds; ice packs  PRECAUTIONS: Cervical and Back  WEIGHT BEARING RESTRICTIONS: No  FALLS:  Has patient fallen in last 6 months? No  LIVING ENVIRONMENT: Lives with: lives with their family Lives in: House/apartment Stairs: No Has following equipment at home: Single point cane  OCCUPATION: disabled  PLOF: Needs assistance with ADLs  PATIENT GOALS: decrease pain, improve QOL, walk through grocery store  NEXT MD VISIT: pain management Friday  OBJECTIVE:     PATIENT SURVEYS:  FOTO Primary score 28% with goal of 34% FOTO 03/25/23: 38%  SCREENING FOR RED FLAGS: No  COGNITION: Overall cognitive status: Within functional limits for tasks assessed     SENSATION: P&N throughout ue to hands and le through feet  MUSCLE LENGTH: Hamstrings: full knee extension in sitting   POSTURE: rounded, elevated shoulders, forward head, and flexed trunk   PALPATION: Ms tightness and TTP throughout traps, post cervical spine, erector sp through lumbar spine   Cervical ROM: extension -10d; flex 40d; rotation R/L ~20d LUMBAR ROM:   AROM eval  Flexion 25%  Extension -20d  Right lateral flexion 25%  Left lateral flexion 25%  Right rotation   Left rotation    (Blank rows = not  tested)  LOWER Extremity  WFL  LOWER EXTREMITY MMT:      Strength testing limited by pain MMT Right eval Left eval  Hip flexion 3 3  Hip extension    Hip abduction 3 3  Hip adduction    Hip internal rotation    Hip external rotation    Knee flexion    Knee extension 3 3  Ankle dorsiflexion    Ankle plantarflexion    Ankle inversion    Ankle eversion     (Blank rows = not tested)    FUNCTIONAL TESTS:  5 times sit to stand: unable to tolerate Timed up and go (TUG): 21.61 Berg Balance Scale: 22/56  GAIT: Distance walked: 200 Assistive device utilized: None Level of assistance: Modified independence Comments: Forward flex positioning, short step length, limited hip/knee flex with heel strike, slowed cadence, guarded  TODAY'S TREATMENT:                                                                                                                              FOTO  Pt seen for aquatic therapy today.  Treatment took place in water 3.5-4.75 ft in depth at the [facility] pool. Temp of water was 91.  Pt entered/exited the pool via stairs in step-to pattern with bilat rail.  * holding yellow hand floats then progressed to unsupported: walking forward and backward  in 4+ ft of water- multiple laps. Cues for arm swing, decreased shoulder elevation, neutral head position *side stepping x 4 widths with shoulder add/abd, cues for increased step height *rest period  in crouched position with queen nods with gentle cervical rotation x 5 each direciton  *walking forward/ backward with cervical movements scanning room L/R * holding wall:  hip abdct/ addct 2 x 5 *L stretch with bent knees x 20s * back against wall: bilat horiz abdct/ addct with pause for pec stretch;  * away from wall:  bilat addct/abdct x 5, tricep push down x 10; alternating bow and arrow x 5 each side *rest period in crouched position    PATIENT EDUCATION:  Education details: posture awareness, aquatic  therapy exercise modifications Person educated: Patient Education method: Explanation Education comprehension: verbalized understanding  HOME EXERCISE PROGRAM: Access Code: [email or web address] URL: [email or web address] Date: 03/16/2023 Prepared by: Geni Bers  Exercises - Seated Cervical Rotation AROM  - 1 x daily - 7 x weekly - 3 sets - 10 reps - Seated Cervical Extension AROM  - 1 x daily - 7 x weekly - 3 sets - 10 reps - Seated Passive Cervical Retraction  - 1 x daily - 7 x weekly - 3 sets - 10 reps - Seated Scapular Retraction  - 1 x daily - 7 x weekly - 3 sets - 10 reps  ASSESSMENT:  CLINICAL IMPRESSION: Pt with gradual improvement of symptoms and posture.  Continues to require cues for neutral head position and to allow body to relax more when submerged.  Encouraged pt to work on stacked posture and to perform self massage to platysma area.  Pt reported reduction of pain to 6/10 during and after session.  Pt would benefit from more education on posture and body mechanics. Pt has met her FOTO goal today.     From eval Patient is a 57 y.o. f who was seen today for physical therapy evaluation and treatment for LBP. She has extensive spinal surgical hx with recurrent radicular pain upper and lower extremities.  She presents today with high pain sensitivity throughout back and extremities. She is greatly limited in cervical through lumbar ROM and does not tolerate strength testing well.  She is a high fall risk.  She is encouraged to ask [facility] for a rollator, shower chair and grab bars for commode as she reports difficulty with functional mobility and ADL's. She will benefit from skilled physical therapy intervention to decrease muscle tightness, improve ROM, decrease pain and improve balance using aquatic therapy medium initially. Re-assessments in future will better assess limitations/ability with further focus on land based PT for improving strength by increased loading for  improvement of functional mobility, ADL's and overall QOL.  OBJECTIVE IMPAIRMENTS: Abnormal gait, decreased activity tolerance, decreased balance, decreased endurance, decreased mobility, difficulty walking, decreased ROM, decreased strength, impaired sensation, improper body mechanics, postural dysfunction, obesity, and pain.   ACTIVITY LIMITATIONS: carrying, lifting, bending, sitting, standing, squatting, sleeping, stairs, transfers, bed mobility, bathing, toileting, reach over head, locomotion level, and caring for others  PARTICIPATION LIMITATIONS: meal prep, cleaning, laundry, driving, shopping, community activity, occupation, and yard work  PERSONAL FACTORS: Fitness, Past/current experiences, and Time since onset of injury/illness/exacerbation are also affecting patient's functional outcome.   REHAB POTENTIAL: Good  CLINICAL DECISION MAKING: Evolving/moderate complexity  EVALUATION COMPLEXITY: Moderate   GOALS: Goals reviewed with patient? Yes  SHORT TERM GOALS: Target date: 04/16/23  Pt will tolerate full aquatic sessions consistently without increase in pain and with improving function to demonstrate good toleration and effectiveness of intervention.   Baseline: Goal status: INITIAL  2.  Pt will improve on Tug test to <or=  15s to demonstrate improvement in  lower extremity function, mobility and decreased fall risk.  Baseline:21.61  Goal status: INITIAL  3.  Pt will obtain DME's as needed to improve safety with ADL's and amb Baseline: needs: rollator; shower chair and grab bars for toilet Goal status: INITIAL  4.  Pt will have a decrease in her minimal pain to </= 4/10 Baseline: 8/10 Goal status: INITIAL  5.  Pt will report completing showering with assist and apprehensiveness of falling Baseline: afraid of falling/not wanting to ask for help Goal status: INITIAL    LONG TERM GOALS: Target date: 05/05/23  Pt to meet stated Foto Goal of 34% Baseline: 28% Goal  status:MET - 03/25/23  2.  Pt will improve strength in all areas listed by  1 grade (MMT, plan to measure using HHD when tolerated)  to demonstrate improved overall physical function  Baseline: see chart Goal status: INITIAL  3.  Pt will improve on Berg balance test to >/=  50/56 to demonstrate a decrease in fall risk. Baseline: 22/56 Goal status: INITIAL  4.  Pt will tolerate walking to and from setting and engaging in aquatic therapy session without excessive fatigue or increase in pain to demonstrate improved toleration to activity.  Baseline: pain and multiple rest periods Goal status: INITIAL  5.  Pt will be able to tolerate walking through grocery store without increase in pain and without seated rest period to demonstrate improved toleration to activity Baseline: unable (personal goal) Goal status: INITIAL  6.  Pt will have an overall decrease in max pain to </= 4/10 for improved QOL Baseline: 8/10 Goal status: INITIAL  PLAN:  PT FREQUENCY: 1-2x/week  PT DURATION: 8 weeks 15 visits  PLANNED INTERVENTIONS: Therapeutic exercises, Therapeutic activity, Neuromuscular re-education, Balance training, Gait training, Patient/Family education, Self Care, Joint mobilization, Stair training, Orthotic/Fit training, DME instructions, Aquatic Therapy, Dry Needling, Cryotherapy, Moist heat, Taping, Ionotophoresis 4mg /ml Dexamethasone, Manual therapy, and Re-evaluation.  PLAN FOR NEXT SESSION: aquatic intervention for general strengthening and mobility, stretching/ROM, pain reduction, posture correction, gait cervical through lumbar spine.   Mayer Camel, PTA 03/25/23 12:03 PM [facility] [address] [place], [place], [address] Phone: [phone]   Fax:  [phone]

## 2023-03-30 ENCOUNTER — Encounter (HOSPITAL_BASED_OUTPATIENT_CLINIC_OR_DEPARTMENT_OTHER): Payer: Self-pay | Admitting: Physical Therapy

## 2023-03-30 ENCOUNTER — Ambulatory Visit (HOSPITAL_BASED_OUTPATIENT_CLINIC_OR_DEPARTMENT_OTHER): Payer: No Typology Code available for payment source | Admitting: Physical Therapy

## 2023-03-30 DIAGNOSIS — M549 Dorsalgia, unspecified: Secondary | ICD-10-CM

## 2023-03-30 DIAGNOSIS — R293 Abnormal posture: Secondary | ICD-10-CM

## 2023-03-30 DIAGNOSIS — M6281 Muscle weakness (generalized): Secondary | ICD-10-CM

## 2023-03-30 NOTE — Therapy (Signed)
OUTPATIENT PHYSICAL THERAPY [facility] TREATMENT    Department of [facility]  [id number] 15 visits approved from 12/18/22-04/17/23 [facility] Contact: Ardelia Mems [phone] Patient Name: Toni Lewis MRN: [id number] DOB:30-Aug-1966, 57 y.o., female Today's Date: 03/30/2023  END OF SESSION:  PT End of Session - 03/30/23 1350     Visit Number 7    Number of Visits 15    Date for PT Re-Evaluation 05/05/23    Authorization Type [facility]    PT Start Time 1400    PT Stop Time 1445    PT Time Calculation (min) 45 min    Behavior During Therapy [facility] for tasks assessed/performed             Past Medical History:  Diagnosis Date   Anal fissure    Anxiety    Depression    Endometriosis    Headache    Hemorrhoids    Hypertension    Peptic ulcer disease    Past Surgical History:  Procedure Laterality Date   COLONOSCOPY  2022   ENDOMETRIAL ABLATION     LUMBAR LAMINECTOMY/ DECOMPRESSION WITH MET-RX Right 10/30/2021   Procedure: Right Lumbar Three-Four Lumbar Four-Five Laminotomy, Discectomy with metrx;  Surgeon: Barnett Abu, MD;  Location: [facility] OR;  Service: Neurosurgery;  Laterality: Right;   POSTERIOR CERVICAL LAMINECTOMY WITH MET- RX Bilateral 10/30/2021   Procedure: Bilateral Cervical Five-Six Foraminotomy w/Metrx;  Surgeon: Barnett Abu, MD;  Location: [facility] OR;  Service: Neurosurgery;  Laterality: Bilateral;   TONSILLECTOMY     Patient Active Problem List   Diagnosis Date Noted   Cervical disc disorder with radiculopathy of cervical region 10/30/2021   Benign essential HTN 02/01/2014   Chest pain 02/01/2014   Syncope, vasovagal 02/01/2014   Palpitations 02/01/2014    PCP: Greta Doom, MD   REFERRING PROVIDER: Greta Doom, MD   REFERRING DIAG:  M54.9 (ICD-10-CM) - Dorsalgia, unspecified   Rationale for Evaluation and Treatment: Rehabilitation  THERAPY DIAG:  Dorsalgia of multiple sites in spine  Abnormal posture  Muscle weakness-general  ONSET  DATE: >10 yrs  SUBJECTIVE:                                                                                                                                                                                           SUBJECTIVE STATEMENT: Good response from last session with reduction in pain.  " I have noticed that after I get out of the water my feet are numb for about 20 mins"  PERTINENT HISTORY:  10/30/21: Surgeon Barnett Abu     LUMBAR LAMINECTOMY/ DECOMPRESSION       POSTERIOR CERVICAL LAMINECTOMY  PAIN:  Are you having pain? Yes: NPRS scale: current 8/10; worst 10/10 Pain location: general  Pain description: N&T arms; right foot numbness, mid back to neck sharp Aggravating factors: walking, standing, showering, toileting Relieving factors: nothing right now; meds; ice packs  PRECAUTIONS: Cervical and Back  WEIGHT BEARING RESTRICTIONS: No  FALLS:  Has patient fallen in last 6 months? No  LIVING ENVIRONMENT: Lives with: lives with their family Lives in: House/apartment Stairs: No Has following equipment at home: Single point cane  OCCUPATION: disabled  PLOF: Needs assistance with ADLs  PATIENT GOALS: decrease pain, improve QOL, walk through grocery store  NEXT MD VISIT: pain management Friday  OBJECTIVE:     PATIENT SURVEYS:  FOTO Primary score 28% with goal of 34% FOTO 03/25/23: 38%  SCREENING FOR RED FLAGS: No  COGNITION: Overall cognitive status: Within functional limits for tasks assessed     SENSATION: P&N throughout ue to hands and le through feet  MUSCLE LENGTH: Hamstrings: full knee extension in sitting   POSTURE: rounded, elevated shoulders, forward head, and flexed trunk   PALPATION: Ms tightness and TTP throughout traps, post cervical spine, erector sp through lumbar spine   Cervical ROM: extension -10d; flex 40d; rotation R/L ~20d LUMBAR ROM:   AROM eval  Flexion 25%  Extension -20d  Right lateral flexion 25%  Left lateral flexion  25%  Right rotation   Left rotation    (Blank rows = not tested)  LOWER Extremity  WFL  LOWER EXTREMITY MMT:      Strength testing limited by pain MMT Right eval Left eval  Hip flexion 3 3  Hip extension    Hip abduction 3 3  Hip adduction    Hip internal rotation    Hip external rotation    Knee flexion    Knee extension 3 3  Ankle dorsiflexion    Ankle plantarflexion    Ankle inversion    Ankle eversion     (Blank rows = not tested)    FUNCTIONAL TESTS:  5 times sit to stand: unable to tolerate Timed up and go (TUG): 21.61 Berg Balance Scale: 22/56  GAIT: Distance walked: 200 Assistive device utilized: None Level of assistance: Modified independence Comments: Forward flex positioning, short step length, limited hip/knee flex with heel strike, slowed cadence, guarded  TODAY'S TREATMENT:                                                                                                                              FOTO  Pt seen for aquatic therapy today.  Treatment took place in water 3.5-4.75 ft in depth at the [facility] pool. Temp of water was 91.  Pt entered/exited the pool via stairs in step-to pattern with bilat rail.  * holding yellow hand floats then progressed to unsupported: walking forward and backward  in 4+ ft of water- multiple laps. Cues for arm swing, decreased shoulder elevation, upright positioning.  - cervical  rotation movements *crouched position with queen nods with gentle cervical rotation x 5 each direciton  *side stepping x 4 widths with shoulder add/abd, cues upright posture; cervical movements as called out *staggered stance; reach overhead x 5 leading R/L. Cues for shoulder engagement without thoracic and cervical extension (difficult) *bow and arrow yellow HB x 10 R/L *standing yellow hb UE bilat horiz abdct/ addct with pause for pec stretch; scapular retraction x10; bilat addct/abdct x 5, tricep push down x 10 *decompression x 3  mins using noodle *TrA engagement: 1/2 noodle pull down x 10 wide stance then staggered.   *L stretch with bent knees x 20s  *rest periods as needed in crouched position throughout session    PATIENT EDUCATION:  Education details: posture awareness, aquatic therapy exercise modifications Person educated: Patient Education method: Explanation Education comprehension: verbalized understanding  HOME EXERCISE PROGRAM: Access Code: [email or web address] URL: [email or web address] Date: 03/16/2023 Prepared by: Geni Bers  Exercises - Seated Cervical Rotation AROM  - 1 x daily - 7 x weekly - 3 sets - 10 reps - Seated Cervical Extension AROM  - 1 x daily - 7 x weekly - 3 sets - 10 reps - Seated Passive Cervical Retraction  - 1 x daily - 7 x weekly - 3 sets - 10 reps - Seated Scapular Retraction  - 1 x daily - 7 x weekly - 3 sets - 10 reps  ASSESSMENT:  CLINICAL IMPRESSION: Pt reports compliance of platysma self message.  Has bought a back theracane to assist. Visually she has improvement in cervical ROM although needs cues to engage rather than using eye movements. Continued vc for posture, neutral head position and relaxation of shoulders. Pt had increased time after last session with reduction of pain. Improvement in relaxation of cervical spine and shlders today by end of treatment     From eval Patient is a 57 y.o. f who was seen today for physical therapy evaluation and treatment for LBP. She has extensive spinal surgical hx with recurrent radicular pain upper and lower extremities.  She presents today with high pain sensitivity throughout back and extremities. She is greatly limited in cervical through lumbar ROM and does not tolerate strength testing well.  She is a high fall risk.  She is encouraged to ask [facility] for a rollator, shower chair and grab bars for commode as she reports difficulty with functional mobility and ADL's. She will benefit from skilled physical therapy  intervention to decrease muscle tightness, improve ROM, decrease pain and improve balance using aquatic therapy medium initially. Re-assessments in future will better assess limitations/ability with further focus on land based PT for improving strength by increased loading for improvement of functional mobility, ADL's and overall QOL.  OBJECTIVE IMPAIRMENTS: Abnormal gait, decreased activity tolerance, decreased balance, decreased endurance, decreased mobility, difficulty walking, decreased ROM, decreased strength, impaired sensation, improper body mechanics, postural dysfunction, obesity, and pain.   ACTIVITY LIMITATIONS: carrying, lifting, bending, sitting, standing, squatting, sleeping, stairs, transfers, bed mobility, bathing, toileting, reach over head, locomotion level, and caring for others  PARTICIPATION LIMITATIONS: meal prep, cleaning, laundry, driving, shopping, community activity, occupation, and yard work  PERSONAL FACTORS: Fitness, Past/current experiences, and Time since onset of injury/illness/exacerbation are also affecting patient's functional outcome.   REHAB POTENTIAL: Good  CLINICAL DECISION MAKING: Evolving/moderate complexity  EVALUATION COMPLEXITY: Moderate   GOALS: Goals reviewed with patient? Yes  SHORT TERM GOALS: Target date: 04/16/23  Pt will tolerate full aquatic sessions consistently without increase in pain and with improving  function to demonstrate good toleration and effectiveness of intervention.   Baseline: Goal status: INITIAL  2.  Pt will improve on Tug test to <or=  15s to demonstrate improvement in lower extremity function, mobility and decreased fall risk.  Baseline:21.61  Goal status: INITIAL  3.  Pt will obtain DME's as needed to improve safety with ADL's and amb Baseline: needs: rollator; shower chair and grab bars for toilet Goal status: INITIAL  4.  Pt will have a decrease in her minimal pain to </= 4/10 Baseline: 8/10 Goal status:  INITIAL  5.  Pt will report completing showering with assist and apprehensiveness of falling Baseline: afraid of falling/not wanting to ask for help Goal status: INITIAL    LONG TERM GOALS: Target date: 05/05/23  Pt to meet stated Foto Goal of 34% Baseline: 28% Goal status:MET - 03/25/23  2.  Pt will improve strength in all areas listed by  1 grade (MMT, plan to measure using HHD when tolerated)  to demonstrate improved overall physical function  Baseline: see chart Goal status: INITIAL  3.  Pt will improve on Berg balance test to >/=  50/56 to demonstrate a decrease in fall risk. Baseline: 22/56 Goal status: INITIAL  4.  Pt will tolerate walking to and from setting and engaging in aquatic therapy session without excessive fatigue or increase in pain to demonstrate improved toleration to activity.  Baseline: pain and multiple rest periods Goal status: INITIAL  5.  Pt will be able to tolerate walking through grocery store without increase in pain and without seated rest period to demonstrate improved toleration to activity Baseline: unable (personal goal) Goal status: INITIAL  6.  Pt will have an overall decrease in max pain to </= 4/10 for improved QOL Baseline: 8/10 Goal status: INITIAL  PLAN:  PT FREQUENCY: 1-2x/week  PT DURATION: 8 weeks 15 visits  PLANNED INTERVENTIONS: Therapeutic exercises, Therapeutic activity, Neuromuscular re-education, Balance training, Gait training, Patient/Family education, Self Care, Joint mobilization, Stair training, Orthotic/Fit training, DME instructions, Aquatic Therapy, Dry Needling, Cryotherapy, Moist heat, Taping, Ionotophoresis 4mg /ml Dexamethasone, Manual therapy, and Re-evaluation.  PLAN FOR NEXT SESSION: aquatic intervention for general strengthening and mobility, stretching/ROM, pain reduction, posture correction, gait cervical through lumbar spine.   Corrie Dandy [place]) Jevon [place]page MPT 03/30/23 1:58 PM [facility] [address] [place], [place], [address] Phone: [phone]   Fax:  [phone]

## 2023-04-02 ENCOUNTER — Encounter (HOSPITAL_BASED_OUTPATIENT_CLINIC_OR_DEPARTMENT_OTHER): Payer: Self-pay | Admitting: Physical Therapy

## 2023-04-02 ENCOUNTER — Ambulatory Visit (HOSPITAL_BASED_OUTPATIENT_CLINIC_OR_DEPARTMENT_OTHER): Payer: No Typology Code available for payment source | Admitting: Physical Therapy

## 2023-04-02 DIAGNOSIS — R293 Abnormal posture: Secondary | ICD-10-CM

## 2023-04-02 DIAGNOSIS — M549 Dorsalgia, unspecified: Secondary | ICD-10-CM | POA: Diagnosis not present

## 2023-04-02 DIAGNOSIS — M6281 Muscle weakness (generalized): Secondary | ICD-10-CM

## 2023-04-02 NOTE — Therapy (Signed)
OUTPATIENT PHYSICAL THERAPY [facility] TREATMENT    Department of [facility]  [id number] 15 visits approved from 12/18/22-04/17/23 [facility] Contact: Ardelia Mems [phone] Patient Name: Toni Lewis MRN: [id number] DOB:July 28, 1966, 57 y.o., female Today's Date: 04/02/2023  END OF SESSION:  PT End of Session - 04/02/23 1109     Visit Number 8    Number of Visits 15    Date for PT Re-Evaluation 05/05/23    [facility]ization Type [facility]    PT Start Time 1108    PT Stop Time 1151    PT Time Calculation (min) 43 min    Behavior During Therapy [facility] for tasks assessed/performed             Past Medical History:  Diagnosis Date   Anal fissure    Anxiety    Depression    Endometriosis    Headache    Hemorrhoids    Hypertension    Peptic ulcer disease    Past Surgical History:  Procedure Laterality Date   COLONOSCOPY  2022   ENDOMETRIAL ABLATION     LUMBAR LAMINECTOMY/ DECOMPRESSION WITH MET-RX Right 10/30/2021   Procedure: Right Lumbar Three-Four Lumbar Four-Five Laminotomy, Discectomy with metrx;  Surgeon: Barnett Abu, MD;  Location: [facility] OR;  Service: Neurosurgery;  Laterality: Right;   POSTERIOR CERVICAL LAMINECTOMY WITH MET- RX Bilateral 10/30/2021   Procedure: Bilateral Cervical Five-Six Foraminotomy w/Metrx;  Surgeon: Barnett Abu, MD;  Location: [facility] OR;  Service: Neurosurgery;  Laterality: Bilateral;   TONSILLECTOMY     Patient Active Problem List   Diagnosis Date Noted   Cervical disc disorder with radiculopathy of cervical region 10/30/2021   Benign essential HTN 02/01/2014   Chest pain 02/01/2014   Syncope, vasovagal 02/01/2014   Palpitations 02/01/2014    PCP: Greta Doom, MD   REFERRING PROVIDER: Greta Doom, MD   REFERRING DIAG:  M54.9 (ICD-10-CM) - Dorsalgia, unspecified   Rationale for Evaluation and Treatment: Rehabilitation  THERAPY DIAG:  Dorsalgia of multiple sites in spine  Abnormal posture  Muscle weakness-general  ONSET  DATE: >10 yrs  SUBJECTIVE:                                                                                                                                                                                           SUBJECTIVE STATEMENT: I didn't feel the heaviness getting out of the water, until I got to the car.  I didn't have the numbness in my feet after the last session.  I did have some cramping in my abdomen after getting my shoes off, but it released after 5 minutes"  PERTINENT HISTORY:  10/30/21:  Surgeon Barnett Abu     LUMBAR LAMINECTOMY/ DECOMPRESSION       POSTERIOR CERVICAL LAMINECTOMY  PAIN:  Are you having pain? Yes: NPRS scale: current 7/10; Pain location: general  Pain description: N&T arms; right foot numbness, mid back to neck sharp Aggravating factors: walking, standing, showering, toileting Relieving factors: nothing right now; meds; ice packs  PRECAUTIONS: Cervical and Back  WEIGHT BEARING RESTRICTIONS: No  FALLS:  Has patient fallen in last 6 months? No  LIVING ENVIRONMENT: Lives with: lives with their family Lives in: House/apartment Stairs: No Has following equipment at home: Single point cane  OCCUPATION: disabled  PLOF: Needs assistance with ADLs  PATIENT GOALS: decrease pain, improve QOL, walk through grocery store  NEXT MD VISIT: pain management Friday  OBJECTIVE:     PATIENT SURVEYS:  FOTO Primary score 28% with goal of 34% FOTO 03/25/23: 38%  SCREENING FOR RED FLAGS: No  COGNITION: Overall cognitive status: Within functional limits for tasks assessed     SENSATION: P&N throughout ue to hands and le through feet  MUSCLE LENGTH: Hamstrings: full knee extension in sitting   POSTURE: rounded, elevated shoulders, forward head, and flexed trunk   PALPATION: Ms tightness and TTP throughout traps, post cervical spine, erector sp through lumbar spine   Cervical ROM: extension -10d; flex 40d; rotation R/L ~20d LUMBAR ROM:   AROM  eval  Flexion 25%  Extension -20d  Right lateral flexion 25%  Left lateral flexion 25%  Right rotation   Left rotation    (Blank rows = not tested)  LOWER Extremity  WFL  LOWER EXTREMITY MMT:      Strength testing limited by pain MMT Right eval Left eval  Hip flexion 3 3  Hip extension    Hip abduction 3 3  Hip adduction    Hip internal rotation    Hip external rotation    Knee flexion    Knee extension 3 3  Ankle dorsiflexion    Ankle plantarflexion    Ankle inversion    Ankle eversion     (Blank rows = not tested)    FUNCTIONAL TESTS:  5 times sit to stand: unable to tolerate Timed up and go (TUG): 21.61 Berg Balance Scale: 22/56  04/02/23:  TUG 20.72s  GAIT: Distance walked: 200 Assistive device utilized: None Level of assistance: Modified independence Comments: Forward flex positioning, short step length, limited hip/knee flex with heel strike, slowed cadence, guarded  TODAY'S TREATMENT:                                                                                                                              Pt seen for aquatic therapy today.  Treatment took place in water 3.5-4.75 ft in depth at the [facility] pool. Temp of water was 91.  Pt entered/exited the pool via stairs in step-to pattern with bilat rail. -TUG on deck * unsupported: walking forward and backward  in 4+ ft  of water- 3 laps. Cues for arm swing, neutral head position, upright chest *side stepping x 2 laps with shoulder add/abd,  *bow and arrow yellow hand floats, alternating sides x 10 R/L *crouched position with queen nods with gentle cervical rotation x 5 each direciton  * marching with alternating arm row with hands on yellow hand floats * back against wall: bilat horiz shoulder abdct/ addct x 5 - 2 rounds  * Bilat Tricep push downs with yellow hand floats x 6 *L stretch with bent knees x 20s * alternating toe taps to blue step with 1 finger on wall (challenge!), -> light  touch on rails with alternating toe taps (improved) * forward step ups onto 1st step with bilat rail x 5 each (challenge) with cues for weight shift over LE vs pulling up with arms; then reciprocal step ups to exit pool  * HHA outside pool over to hot tub with cues for upright posture, increased step length *staggered stance; reach overhead x 5 leading R/L. Cues for shoulder engagement without thoracic and cervical extension (difficult)    PATIENT EDUCATION:  Education details: posture awareness, aquatic therapy exercise modifications Person educated: Patient Education method: Explanation Education comprehension: verbalized understanding  HOME EXERCISE PROGRAM: Access Code: [email or web address] URL: [email or web address] Date: 03/16/2023 Prepared by: Geni Bers  Exercises - Seated Cervical Rotation AROM  - 1 x daily - 7 x weekly - 3 sets - 10 reps - Seated Cervical Extension AROM  - 1 x daily - 7 x weekly - 3 sets - 10 reps - Seated Passive Cervical Retraction  - 1 x daily - 7 x weekly - 3 sets - 10 reps - Seated Scapular Retraction  - 1 x daily - 7 x weekly - 3 sets - 10 reps  ASSESSMENT:  CLINICAL IMPRESSION: Pt is observed with more upright posture when ambulating in the water; no longer needs UE support on floatation.  She demonstrates increased difficulty with stairs (both ascending/ descending) and relies heavily on UE; may benefit from working on stairs in 4 ft of water next session.  She tolerates aquatic exercises well, with very short rest breaks after 1- 2 different exercises.  Overall pain level is decreasing.  Pt is progressing gradually towards goals.      From eval Patient is a 57 y.o. f who was seen today for physical therapy evaluation and treatment for LBP. She has extensive spinal surgical hx with recurrent radicular pain upper and lower extremities.  She presents today with high pain sensitivity throughout back and extremities. She is greatly limited in  cervical through lumbar ROM and does not tolerate strength testing well.  She is a high fall risk.  She is [facility] a rollator, shower chair and grab bars for commode as she reports difficulty with functional mobility and ADL's. She will benefit from skilled physical therapy intervention to decrease muscle tightness, improve ROM, decrease pain and improve balance using aquatic therapy medium initially. Re-assessments in future will better assess limitations/ability with further focus on land based PT for improving strength by increased loading for improvement of functional mobility, ADL's and overall QOL.  OBJECTIVE IMPAIRMENTS: Abnormal gait, decreased activity tolerance, decreased balance, decreased endurance, decreased mobility, difficulty walking, decreased ROM, decreased strength, impaired sensation, improper body mechanics, postural dysfunction, obesity, and pain.   ACTIVITY LIMITATIONS: carrying, lifting, bending, sitting, standing, squatting, sleeping, stairs, transfers, bed mobility, bathing, toileting, reach over head, locomotion level, and caring for others  PARTICIPATION LIMITATIONS: meal prep,  cleaning, laundry, driving, shopping, community activity, occupation, and yard work  PERSONAL FACTORS: Fitness, Past/current experiences, and Time since onset of injury/illness/exacerbation are also affecting patient's functional outcome.   REHAB POTENTIAL: Good  CLINICAL DECISION MAKING: Evolving/moderate complexity  EVALUATION COMPLEXITY: Moderate   GOALS: Goals reviewed with patient? Yes  SHORT TERM GOALS: Target date: 04/16/23  Pt will tolerate full aquatic sessions consistently without increase in pain and with improving function to demonstrate good toleration and effectiveness of intervention.   Baseline: Goal status: MET - 04/02/23  2.  Pt will improve on Tug test to <or=  15s to demonstrate improvement in lower extremity function, mobility and decreased fall risk.   Baseline:21.61  Goal status: IN PROGRESS  3.  Pt will obtain DME's as needed to improve safety with ADL's and amb Baseline: needs: rollator; shower chair and grab bars for toilet Goal status: INITIAL  4.  Pt will have a decrease in her minimal pain to </= 4/10 Baseline: 8/10 Goal status: INITIAL  5.  Pt will report completing showering with assist and apprehensiveness of falling Baseline: afraid of falling/not wanting to ask for help Goal status: INITIAL    LONG TERM GOALS: Target date: 05/05/23  Pt to meet stated Foto Goal of 34% Baseline: 28% Goal status:MET - 03/25/23  2.  Pt will improve strength in all areas listed by  1 grade (MMT, plan to measure using HHD when tolerated)  to demonstrate improved overall physical function  Baseline: see chart Goal status: INITIAL  3.  Pt will improve on Berg balance test to >/=  50/56 to demonstrate a decrease in fall risk. Baseline: 22/56 Goal status: INITIAL  4.  Pt will tolerate walking to and from setting and engaging in aquatic therapy session without excessive fatigue or increase in pain to demonstrate improved toleration to activity.  Baseline: pain and multiple rest periods Goal status: INITIAL  5.  Pt will be able to tolerate walking through grocery store without increase in pain and without seated rest period to demonstrate improved toleration to activity Baseline: unable (personal goal) Goal status: INITIAL  6.  Pt will have an overall decrease in max pain to </= 4/10 for improved QOL Baseline: 8/10 Goal status: INITIAL  PLAN:  PT FREQUENCY: 1-2x/week  PT DURATION: 8 weeks 15 visits  PLANNED INTERVENTIONS: Therapeutic exercises, Therapeutic activity, Neuromuscular re-education, Balance training, Gait training, Patient/Family education, Self Care, Joint mobilization, Stair training, Orthotic/Fit training, DME instructions, Aquatic Therapy, Dry Needling, Cryotherapy, Moist heat, Taping, Ionotophoresis 4mg /ml  Dexamethasone, Manual therapy, and Re-evaluation.  PLAN FOR NEXT SESSION: aquatic intervention for general strengthening and mobility, stretching/ROM, pain reduction, posture correction, gait cervical through lumbar spine.  Mayer Camel, PTA 04/02/23 12:56 PM [facility] [address] [place], [place], [address] Phone: [phone]   Fax:  [phone]

## 2023-04-07 ENCOUNTER — Encounter (HOSPITAL_BASED_OUTPATIENT_CLINIC_OR_DEPARTMENT_OTHER): Payer: Self-pay | Admitting: Physical Therapy

## 2023-04-07 ENCOUNTER — Ambulatory Visit (HOSPITAL_BASED_OUTPATIENT_CLINIC_OR_DEPARTMENT_OTHER): Payer: No Typology Code available for payment source | Admitting: Physical Therapy

## 2023-04-07 DIAGNOSIS — M549 Dorsalgia, unspecified: Secondary | ICD-10-CM | POA: Diagnosis not present

## 2023-04-07 DIAGNOSIS — R293 Abnormal posture: Secondary | ICD-10-CM

## 2023-04-07 DIAGNOSIS — M6281 Muscle weakness (generalized): Secondary | ICD-10-CM

## 2023-04-07 NOTE — Therapy (Signed)
OUTPATIENT PHYSICAL THERAPY [place] TREATMENT    Department of [facility]  [id number] 15 visits approved from 12/18/22-04/17/23 [facility] Contact: Ardelia Mems [phone] Patient Name: Toni Lewis MRN: [id number] DOB:05-22-1966, 57 y.o., female Today's Date: 04/07/2023  END OF SESSION:  PT End of Session - 04/07/23 1324     Visit Number 9    Number of Visits 15    Date for PT Re-Evaluation 05/05/23    Authorization Type [facility]    PT Start Time 0901    PT Stop Time 0945    PT Time Calculation (min) 44 min    Activity Tolerance Patient tolerated treatment well    Behavior During Therapy [facility] for tasks assessed/performed             Past Medical History:  Diagnosis Date   Anal fissure    Anxiety    Depression    Endometriosis    Headache    Hemorrhoids    Hypertension    Peptic ulcer disease    Past Surgical History:  Procedure Laterality Date   COLONOSCOPY  2022   ENDOMETRIAL ABLATION     LUMBAR LAMINECTOMY/ DECOMPRESSION WITH MET-RX Right 10/30/2021   Procedure: Right Lumbar Three-Four Lumbar Four-Five Laminotomy, Discectomy with metrx;  Surgeon: Barnett Abu, MD;  Location: [facility] OR;  Service: Neurosurgery;  Laterality: Right;   POSTERIOR CERVICAL LAMINECTOMY WITH MET- RX Bilateral 10/30/2021   Procedure: Bilateral Cervical Five-Six Foraminotomy w/Metrx;  Surgeon: Barnett Abu, MD;  Location: [facility] OR;  Service: Neurosurgery;  Laterality: Bilateral;   TONSILLECTOMY     Patient Active Problem List   Diagnosis Date Noted   Cervical disc disorder with radiculopathy of cervical region 10/30/2021   Benign essential HTN 02/01/2014   Chest pain 02/01/2014   Syncope, vasovagal 02/01/2014   Palpitations 02/01/2014    PCP: Greta Doom, MD   REFERRING PROVIDER: Greta Doom, MD   REFERRING DIAG:  M54.9 (ICD-10-CM) - Dorsalgia, unspecified   Rationale for Evaluation and Treatment: Rehabilitation  THERAPY DIAG:  Dorsalgia of multiple sites in  spine  Abnormal posture  Muscle weakness-general  ONSET DATE: >10 yrs  SUBJECTIVE:                                                                                                                                                                                           SUBJECTIVE STATEMENT: I was flaired after last session I think because I practiced stair climbing.    PERTINENT HISTORY:  10/30/21: Surgeon Barnett Abu     LUMBAR LAMINECTOMY/ DECOMPRESSION       POSTERIOR CERVICAL LAMINECTOMY  PAIN:  Are you having  pain? Yes: NPRS scale: current 10/10; Pain location: general  Pain description: N&T arms; right foot numbness, mid back to neck sharp Aggravating factors: walking, standing, showering, toileting Relieving factors: nothing right now; meds; ice packs  PRECAUTIONS: Cervical and Back  WEIGHT BEARING RESTRICTIONS: No  FALLS:  Has patient fallen in last 6 months? No  LIVING ENVIRONMENT: Lives with: lives with their family Lives in: House/apartment Stairs: No Has following equipment at home: Single point cane  OCCUPATION: disabled  PLOF: Needs assistance with ADLs  PATIENT GOALS: decrease pain, improve QOL, walk through grocery store  NEXT MD VISIT: pain management Friday  OBJECTIVE:     PATIENT SURVEYS:  FOTO Primary score 28% with goal of 34% FOTO 03/25/23: 38%  SCREENING FOR RED FLAGS: No  COGNITION: Overall cognitive status: Within functional limits for tasks assessed     SENSATION: P&N throughout ue to hands and le through feet  MUSCLE LENGTH: Hamstrings: full knee extension in sitting   POSTURE: rounded, elevated shoulders, forward head, and flexed trunk   PALPATION: Ms tightness and TTP throughout traps, post cervical spine, erector sp through lumbar spine   Cervical ROM: extension -10d; flex 40d; rotation R/L ~20d LUMBAR ROM:   AROM eval  Flexion 25%  Extension -20d  Right lateral flexion 25%  Left lateral flexion 25%  Right  rotation   Left rotation    (Blank rows = not tested)  LOWER Extremity  WFL  LOWER EXTREMITY MMT:      Strength testing limited by pain MMT Right eval Left eval  Hip flexion 3 3  Hip extension    Hip abduction 3 3  Hip adduction    Hip internal rotation    Hip external rotation    Knee flexion    Knee extension 3 3  Ankle dorsiflexion    Ankle plantarflexion    Ankle inversion    Ankle eversion     (Blank rows = not tested)    FUNCTIONAL TESTS:  5 times sit to stand: unable to tolerate Timed up and go (TUG): 21.61 Berg Balance Scale: 22/56  04/02/23:  TUG 20.72s  GAIT: Distance walked: 200 Assistive device utilized: None Level of assistance: Modified independence Comments: Forward flex positioning, short step length, limited hip/knee flex with heel strike, slowed cadence, guarded  TODAY'S TREATMENT:                                                                                                                              Pt seen for aquatic therapy today.  Treatment took place in water 3.5-4.75 ft in depth at the [facility] pool. Temp of water was 91.  Pt entered/exited the pool via stairs in step-to pattern with bilat rail.  * unsupported: walking forward and backward  in 4+ ft of water- 3 laps. Cues for arm swing, neutral head position, upright chest and heel strike *side stepping x 2 laps with shoulder add/abd,  *bow and  arrow yellow hand floats, alternating sides x 10 R/L *crouched position with queen nods with gentle cervical rotation x 5 each direciton  * feet staggered: bilat horiz shoulder abdct/ addct x 8 - 2 rounds  * Bilat Tricep push downs with yellow hand floats x 10 *vertical decompression using yellow noodle  - cycling; hip add/abd; skiing * forward step ups onto blue step in 4.37ft. R/L, unsupported with unsteadiness. Cues for weight shift. Improves with repetition  - side step ups R/L . Encouraged unsupported.  Reaches for wall several  times but able to complete unsupported by end of reps.  - forward step downs x 2 with unsteadiness. *stair climbing to exit pool with improved execution, encouragement  HA outside pool over to hot tub with cues for upright posture, increased step length     PATIENT EDUCATION:  Education details: posture awareness, aquatic therapy exercise modifications Person educated: Patient Education method: Explanation Education comprehension: verbalized understanding  HOME EXERCISE PROGRAM: Access Code: [email or web address] URL: [email or web address] Date: 03/16/2023 Prepared by: Geni Bers  Exercises - Seated Cervical Rotation AROM  - 1 x daily - 7 x weekly - 3 sets - 10 reps - Seated Cervical Extension AROM  - 1 x daily - 7 x weekly - 3 sets - 10 reps - Seated Passive Cervical Retraction  - 1 x daily - 7 x weekly - 3 sets - 10 reps - Seated Scapular Retraction  - 1 x daily - 7 x weekly - 3 sets - 10 reps  ASSESSMENT:  CLINICAL IMPRESSION: Continues to require cuing for gait (step length and heel strike) and for decreased shoulder and cervical guarding with all movement. Completed step ups in 4 ft with improved execution using buoyancy assist. Goals ongoing     From eval Patient is a 57 y.o. f who was seen today for physical therapy evaluation and treatment for LBP. She has extensive spinal surgical hx with recurrent radicular pain upper and lower extremities.  She presents today with high pain sensitivity throughout back and extremities. She is greatly limited in cervical through lumbar ROM and does not tolerate strength testing well.  She is a high fall risk.  She is [facility] to ask [facility] a rollator, shower chair and grab bars for commode as she reports difficulty with functional mobility and ADL's. She will benefit from skilled physical therapy intervention to decrease muscle tightness, improve ROM, decrease pain and improve balance using aquatic therapy medium initially.  Re-assessments in future will better assess limitations/ability with further focus on land based PT for improving strength by increased loading for improvement of functional mobility, ADL's and overall QOL.  OBJECTIVE IMPAIRMENTS: Abnormal gait, decreased activity tolerance, decreased balance, decreased endurance, decreased mobility, difficulty walking, decreased ROM, decreased strength, impaired sensation, improper body mechanics, postural dysfunction, obesity, and pain.   ACTIVITY LIMITATIONS: carrying, lifting, bending, sitting, standing, squatting, sleeping, stairs, transfers, bed mobility, bathing, toileting, reach over head, locomotion level, and caring for others  PARTICIPATION LIMITATIONS: meal prep, cleaning, laundry, driving, shopping, community activity, occupation, and yard work  PERSONAL FACTORS: Fitness, Past/current experiences, and Time since onset of injury/illness/exacerbation are also affecting patient's functional outcome.   REHAB POTENTIAL: Good  CLINICAL DECISION MAKING: Evolving/moderate complexity  EVALUATION COMPLEXITY: Moderate   GOALS: Goals reviewed with patient? Yes  SHORT TERM GOALS: Target date: 04/16/23  Pt will tolerate full aquatic sessions consistently without increase in pain and with improving function to demonstrate good toleration and effectiveness of intervention.   Baseline: Goal status:  MET - 04/02/23  2.  Pt will improve on Tug test to <or=  15s to demonstrate improvement in lower extremity function, mobility and decreased fall risk.  Baseline:21.61  Goal status: IN PROGRESS  3.  Pt will obtain DME's as needed to improve safety with ADL's and amb Baseline: needs: rollator; shower chair and grab bars for toilet Goal status: INITIAL  4.  Pt will have a decrease in her minimal pain to </= 4/10 Baseline: 8/10 Goal status: INITIAL  5.  Pt will report completing showering with assist and apprehensiveness of falling Baseline: afraid of  falling/not wanting to ask for help Goal status: INITIAL    LONG TERM GOALS: Target date: 05/05/23  Pt to meet stated Foto Goal of 34% Baseline: 28% Goal status:MET - 03/25/23  2.  Pt will improve strength in all areas listed by  1 grade (MMT, plan to measure using HHD when tolerated)  to demonstrate improved overall physical function  Baseline: see chart Goal status: INITIAL  3.  Pt will improve on Berg balance test to >/=  50/56 to demonstrate a decrease in fall risk. Baseline: 22/56 Goal status: INITIAL  4.  Pt will tolerate walking to and from setting and engaging in aquatic therapy session without excessive fatigue or increase in pain to demonstrate improved toleration to activity.  Baseline: pain and multiple rest periods Goal status: INITIAL  5.  Pt will be able to tolerate walking through grocery store without increase in pain and without seated rest period to demonstrate improved toleration to activity Baseline: unable (personal goal) Goal status: INITIAL  6.  Pt will have an overall decrease in max pain to </= 4/10 for improved QOL Baseline: 8/10 Goal status: INITIAL  PLAN:  PT FREQUENCY: 1-2x/week  PT DURATION: 8 weeks 15 visits  PLANNED INTERVENTIONS: Therapeutic exercises, Therapeutic activity, Neuromuscular re-education, Balance training, Gait training, Patient/Family education, Self Care, Joint mobilization, Stair training, Orthotic/Fit training, DME instructions, Aquatic Therapy, Dry Needling, Cryotherapy, Moist heat, Taping, Ionotophoresis 4mg /ml Dexamethasone, Manual therapy, and Re-evaluation.  PLAN FOR NEXT SESSION: aquatic intervention for general strengthening and mobility, stretching/ROM, pain reduction, posture correction, gait cervical through lumbar spine.  [address] [place]) Willeen Novak MPT 04/07/23 1:25 PM [facility] [address] [place], [place], [address] Phone: [phone]   Fax:  [phone]

## 2023-04-09 ENCOUNTER — Encounter (HOSPITAL_BASED_OUTPATIENT_CLINIC_OR_DEPARTMENT_OTHER): Payer: Self-pay | Admitting: Physical Therapy

## 2023-04-09 ENCOUNTER — Ambulatory Visit (HOSPITAL_BASED_OUTPATIENT_CLINIC_OR_DEPARTMENT_OTHER): Payer: No Typology Code available for payment source | Admitting: Physical Therapy

## 2023-04-09 DIAGNOSIS — M549 Dorsalgia, unspecified: Secondary | ICD-10-CM

## 2023-04-09 DIAGNOSIS — M6281 Muscle weakness (generalized): Secondary | ICD-10-CM

## 2023-04-09 DIAGNOSIS — R293 Abnormal posture: Secondary | ICD-10-CM

## 2023-04-09 NOTE — Therapy (Signed)
OUTPATIENT PHYSICAL THERAPY [facility] TREATMENT    Department of [facility]  [id number] 15 visits approved from 12/18/22-04/17/23 [facility] Contact: Ardelia Mems [phone] Patient Name: Toni Lewis MRN: [id number] DOB:1965-12-11, 57 y.o., female Today's Date: 04/09/2023  END OF SESSION:  PT End of Session - 04/09/23 1004     Visit Number 10    Number of Visits 15    Date for PT Re-Evaluation 05/05/23    [facility] Type [facility]    PT Start Time [phone]    PT Stop Time 1030    PT Time Calculation (min) 42 min    Activity Tolerance Patient tolerated treatment well    Behavior During Therapy [facility] for tasks assessed/performed             Past Medical History:  Diagnosis Date   Anal fissure    Anxiety    Depression    Endometriosis    Headache    Hemorrhoids    Hypertension    Peptic ulcer disease    Past Surgical History:  Procedure Laterality Date   COLONOSCOPY  2022   ENDOMETRIAL ABLATION     LUMBAR LAMINECTOMY/ DECOMPRESSION WITH MET-RX Right 10/30/2021   Procedure: Right Lumbar Three-Four Lumbar Four-Five Laminotomy, Discectomy with metrx;  Surgeon: Barnett Abu, MD;  Location: [facility] OR;  Service: Neurosurgery;  Laterality: Right;   POSTERIOR CERVICAL LAMINECTOMY WITH MET- RX Bilateral 10/30/2021   Procedure: Bilateral Cervical Five-Six Foraminotomy w/Metrx;  Surgeon: Barnett Abu, MD;  Location: [facility] OR;  Service: Neurosurgery;  Laterality: Bilateral;   TONSILLECTOMY     Patient Active Problem List   Diagnosis Date Noted   Cervical disc disorder with radiculopathy of cervical region 10/30/2021   Benign essential HTN 02/01/2014   Chest pain 02/01/2014   Syncope, vasovagal 02/01/2014   Palpitations 02/01/2014    PCP: Greta Doom, MD   REFERRING PROVIDER: Greta Doom, MD   REFERRING DIAG:  M54.9 (ICD-10-CM) - Dorsalgia, unspecified   Rationale for Evaluation and Treatment: Rehabilitation  THERAPY DIAG:  Dorsalgia of multiple sites in  spine  Abnormal posture  Muscle weakness-general  ONSET DATE: >10 yrs  SUBJECTIVE:                                                                                                                                                                                           SUBJECTIVE STATEMENT: "Good day pain a little lower"  PERTINENT HISTORY:  10/30/21: Surgeon Barnett Abu     LUMBAR LAMINECTOMY/ DECOMPRESSION       POSTERIOR CERVICAL LAMINECTOMY  PAIN:  Are you having pain? Yes: NPRS scale: current 8/10; Pain location: general  Pain description: N&T arms; right foot numbness, mid back to neck sharp Aggravating factors: walking, standing, showering, toileting Relieving factors: nothing right now; meds; ice packs  PRECAUTIONS: Cervical and Back  WEIGHT BEARING RESTRICTIONS: No  FALLS:  Has patient fallen in last 6 months? No  LIVING ENVIRONMENT: Lives with: lives with their family Lives in: House/apartment Stairs: No Has following equipment at home: Single point cane  OCCUPATION: disabled  PLOF: Needs assistance with ADLs  PATIENT GOALS: decrease pain, improve QOL, walk through grocery store  NEXT MD VISIT: pain management Friday  OBJECTIVE:     PATIENT SURVEYS:  FOTO Primary score 28% with goal of 34% FOTO 03/25/23: 38%  SCREENING FOR RED FLAGS: No  COGNITION: Overall cognitive status: Within functional limits for tasks assessed     SENSATION: P&N throughout ue to hands and le through feet  MUSCLE LENGTH: Hamstrings: full knee extension in sitting   POSTURE: rounded, elevated shoulders, forward head, and flexed trunk   PALPATION: Ms tightness and TTP throughout traps, post cervical spine, erector sp through lumbar spine   Cervical ROM: extension -10d; flex 40d; rotation R/L ~20d LUMBAR ROM:   AROM eval  Flexion 25%  Extension -20d  Right lateral flexion 25%  Left lateral flexion 25%  Right rotation   Left rotation    (Blank rows = not  tested)  LOWER Extremity  WFL  LOWER EXTREMITY MMT:      Strength testing limited by pain MMT Right eval Left eval  Hip flexion 3 3  Hip extension    Hip abduction 3 3  Hip adduction    Hip internal rotation    Hip external rotation    Knee flexion    Knee extension 3 3  Ankle dorsiflexion    Ankle plantarflexion    Ankle inversion    Ankle eversion     (Blank rows = not tested)    FUNCTIONAL TESTS:  5 times sit to stand: unable to tolerate Timed up and go (TUG): 21.61 Berg Balance Scale: 22/56  04/02/23:  TUG 20.72s  GAIT: Distance walked: 200 Assistive device utilized: None Level of assistance: Modified independence Comments: Forward flex positioning, short step length, limited hip/knee flex with heel strike, slowed cadence, guarded  TODAY'S TREATMENT:                                                                                                                              Pt seen for aquatic therapy today.  Treatment took place in water 3.5-4.75 ft in depth at the [facility] pool. Temp of water was 91.  Pt entered/exited the pool via stairs in step-to pattern with bilat rail.  * unsupported: walking forward and backward  in 4+ ft of water- 3 laps. Cues for arm swing, neutral head position, upright chest and heel strike *side stepping x 2 laps with shoulder add/abd,  *bow and arrow yellow hand floats, alternating sides x 10 R/L *crouched  position with queen nods with gentle cervical rotation x 10 each direciton  *bilat KTC knees to chest, hands on rail feet on bottom ladder rung/hole * feet staggered: bilat horiz shoulder abdct/ addct x 8 - 2 rounds  * Bilat Tricep push downs with yellow hand floats x 10 *vertical decompression using yellow noodle  - cycling; hip add/abd; skiing * forward step ups onto blue step in 4.47ft. R/L cues for weight shift and engagement of hip/knee flex/ext  - side step ups multiple reps cues for weight shift and engagement  of hip/knee flex/ext *stair climbing to exit pool vc for weight shift and decreased UE support/assist HA outside pool over to hot tub with cues for upright posture, increased step length     PATIENT EDUCATION:  Education details: posture awareness, aquatic therapy exercise modifications Person educated: Patient Education method: Explanation Education comprehension: verbalized understanding  HOME EXERCISE PROGRAM: Access Code: [email or web address] URL: [email or web address] Date: 03/16/2023 Prepared by: Geni Bers  Exercises - Seated Cervical Rotation AROM  - 1 x daily - 7 x weekly - 3 sets - 10 reps - Seated Cervical Extension AROM  - 1 x daily - 7 x weekly - 3 sets - 10 reps - Seated Passive Cervical Retraction  - 1 x daily - 7 x weekly - 3 sets - 10 reps - Seated Scapular Retraction  - 1 x daily - 7 x weekly - 3 sets - 10 reps  ASSESSMENT:  CLINICAL IMPRESSION: Pt reporting decreasing tingling in feet after sessions lasting last ~30 mins rather than hours. Pain focused in central LB today, possibly suggesting some centralization of symptoms.  She gained an excellent LB stretch at ladder with relief of LBP after dealing with cramp along right mid to LB paraspinals. She demonstrates an increase ease with movement as well as cervical rom.  Improved execution of stair climbing weight shifting approp.  Goals ongoing  Continues to require cuing for gait (step length and heel strike) and for decreased shoulder and cervical guarding with all movement. Completed step ups in 4 ft with improved execution using buoyancy assist. Goals ongoing     From eval Patient is a 57 y.o. f who was seen today for physical therapy evaluation and treatment for LBP. She has extensive spinal surgical hx with recurrent radicular pain upper and lower extremities.  She presents today with high pain sensitivity throughout back and extremities. She is greatly limited in cervical through lumbar ROM and does  not tolerate strength testing well.  She is a high fall risk.  She is [facility] rollator, shower chair and grab bars for commode as she reports difficulty with functional mobility and ADL's. She will benefit from skilled physical therapy intervention to decrease muscle tightness, improve ROM, decrease pain and improve balance using aquatic therapy medium initially. Re-assessments in future will better assess limitations/ability with further focus on land based PT for improving strength by increased loading for improvement of functional mobility, ADL's and overall QOL.  OBJECTIVE IMPAIRMENTS: Abnormal gait, decreased activity tolerance, decreased balance, decreased endurance, decreased mobility, difficulty walking, decreased ROM, decreased strength, impaired sensation, improper body mechanics, postural dysfunction, obesity, and pain.   ACTIVITY LIMITATIONS: carrying, lifting, bending, sitting, standing, squatting, sleeping, stairs, transfers, bed mobility, bathing, toileting, reach over head, locomotion level, and caring for others  PARTICIPATION LIMITATIONS: meal prep, cleaning, laundry, driving, shopping, community activity, occupation, and yard work  PERSONAL FACTORS: Fitness, Past/current experiences, and Time since onset of injury/illness/exacerbation are also  affecting patient's functional outcome.   REHAB POTENTIAL: Good  CLINICAL DECISION MAKING: Evolving/moderate complexity  EVALUATION COMPLEXITY: Moderate   GOALS: Goals reviewed with patient? Yes  SHORT TERM GOALS: Target date: 04/16/23  Pt will tolerate full aquatic sessions consistently without increase in pain and with improving function to demonstrate good toleration and effectiveness of intervention.   Baseline: Goal status: MET - 04/02/23  2.  Pt will improve on Tug test to <or=  15s to demonstrate improvement in lower extremity function, mobility and decreased fall risk.  Baseline:21.61  Goal status: IN  PROGRESS  3.  Pt will obtain DME's as needed to improve safety with ADL's and amb Baseline: needs: rollator; shower chair and grab bars for toilet Goal status: INITIAL  4.  Pt will have a decrease in her minimal pain to </= 4/10 Baseline: 8/10 Goal status: INITIAL  5.  Pt will report completing showering with assist and apprehensiveness of falling Baseline: afraid of falling/not wanting to ask for help Goal status: INITIAL    LONG TERM GOALS: Target date: 05/05/23  Pt to meet stated Foto Goal of 34% Baseline: 28% Goal status:MET - 03/25/23  2.  Pt will improve strength in all areas listed by  1 grade (MMT, plan to measure using HHD when tolerated)  to demonstrate improved overall physical function  Baseline: see chart Goal status: INITIAL  3.  Pt will improve on Berg balance test to >/=  50/56 to demonstrate a decrease in fall risk. Baseline: 22/56 Goal status: INITIAL  4.  Pt will tolerate walking to and from setting and engaging in aquatic therapy session without excessive fatigue or increase in pain to demonstrate improved toleration to activity.  Baseline: pain and multiple rest periods Goal status: INITIAL  5.  Pt will be able to tolerate walking through grocery store without increase in pain and without seated rest period to demonstrate improved toleration to activity Baseline: unable (personal goal) Goal status: INITIAL  6.  Pt will have an overall decrease in max pain to </= 4/10 for improved QOL Baseline: 8/10 Goal status: INITIAL  PLAN:  PT FREQUENCY: 1-2x/week  PT DURATION: 8 weeks 15 visits  PLANNED INTERVENTIONS: Therapeutic exercises, Therapeutic activity, Neuromuscular re-education, Balance training, Gait training, Patient/Family education, Self Care, Joint mobilization, Stair training, Orthotic/Fit training, DME instructions, Aquatic Therapy, Dry Needling, Cryotherapy, Moist heat, Taping, Ionotophoresis 4mg /ml Dexamethasone, Manual therapy, and  Re-evaluation.  PLAN FOR NEXT SESSION: aquatic intervention for general strengthening and mobility, stretching/ROM, pain reduction, posture correction, gait cervical through lumbar spine.  [address] [place]) Omir Cooprider MPT 04/09/23 10:05 AM [facility] [address] [place], [place], [address] Phone: [phone]   Fax:  [phone]

## 2023-04-13 ENCOUNTER — Ambulatory Visit (HOSPITAL_BASED_OUTPATIENT_CLINIC_OR_DEPARTMENT_OTHER): Payer: No Typology Code available for payment source | Admitting: Physical Therapy

## 2023-04-13 ENCOUNTER — Encounter (HOSPITAL_BASED_OUTPATIENT_CLINIC_OR_DEPARTMENT_OTHER): Payer: Self-pay | Admitting: Physical Therapy

## 2023-04-13 DIAGNOSIS — M549 Dorsalgia, unspecified: Secondary | ICD-10-CM | POA: Diagnosis not present

## 2023-04-13 DIAGNOSIS — M6281 Muscle weakness (generalized): Secondary | ICD-10-CM

## 2023-04-13 DIAGNOSIS — R293 Abnormal posture: Secondary | ICD-10-CM

## 2023-04-13 NOTE — Therapy (Signed)
OUTPATIENT PHYSICAL THERAPY [facility] TREATMENT    Department of [facility]  [id number] 15 visits approved from 12/18/22-04/17/23 [facility] Contact: Ardelia Mems [phone] Patient Name: Toni Lewis MRN: [id number] DOB:January 03, 1966, 57 y.o., female Today's Date: 04/13/2023  END OF SESSION:  PT End of Session - 04/13/23 1544     Visit Number 11    Number of Visits 15    Date for PT Re-Evaluation 05/05/23    [facility]ization Type [facility]    PT Start Time 1531    PT Stop Time 1615    PT Time Calculation (min) 44 min    Activity Tolerance Patient tolerated treatment well    Behavior During Therapy [facility] for tasks assessed/performed             Past Medical History:  Diagnosis Date   Anal fissure    Anxiety    Depression    Endometriosis    Headache    Hemorrhoids    Hypertension    Peptic ulcer disease    Past Surgical History:  Procedure Laterality Date   COLONOSCOPY  2022   ENDOMETRIAL ABLATION     LUMBAR LAMINECTOMY/ DECOMPRESSION WITH MET-RX Right 10/30/2021   Procedure: Right Lumbar Three-Four Lumbar Four-Five Laminotomy, Discectomy with metrx;  Surgeon: Barnett Abu, MD;  Location: [facility] OR;  Service: Neurosurgery;  Laterality: Right;   POSTERIOR CERVICAL LAMINECTOMY WITH MET- RX Bilateral 10/30/2021   Procedure: Bilateral Cervical Five-Six Foraminotomy w/Metrx;  Surgeon: Barnett Abu, MD;  Location: [facility] OR;  Service: Neurosurgery;  Laterality: Bilateral;   TONSILLECTOMY     Patient Active Problem List   Diagnosis Date Noted   Cervical disc disorder with radiculopathy of cervical region 10/30/2021   Benign essential HTN 02/01/2014   Chest pain 02/01/2014   Syncope, vasovagal 02/01/2014   Palpitations 02/01/2014    PCP: Greta Doom, MD   REFERRING PROVIDER: Greta Doom, MD   REFERRING DIAG:  M54.9 (ICD-10-CM) - Dorsalgia, unspecified   Rationale for Evaluation and Treatment: Rehabilitation  THERAPY DIAG:  Dorsalgia of multiple sites in  spine  Abnormal posture  Muscle weakness-general  ONSET DATE: >10 yrs  SUBJECTIVE:                                                                                                                                                                                           SUBJECTIVE STATEMENT: "Good day pain a little lower"  PERTINENT HISTORY:  10/30/21: Surgeon Barnett Abu     LUMBAR LAMINECTOMY/ DECOMPRESSION       POSTERIOR CERVICAL LAMINECTOMY  PAIN:  Are you having pain? Yes: NPRS scale: current 8/10; Pain location: general  Pain description: N&T arms; right foot numbness, mid back to neck sharp Aggravating factors: walking, standing, showering, toileting Relieving factors: nothing right now; meds; ice packs  PRECAUTIONS: Cervical and Back  WEIGHT BEARING RESTRICTIONS: No  FALLS:  Has patient fallen in last 6 months? No  LIVING ENVIRONMENT: Lives with: lives with their family Lives in: House/apartment Stairs: No Has following equipment at home: Single point cane  OCCUPATION: disabled  PLOF: Needs assistance with ADLs  PATIENT GOALS: decrease pain, improve QOL, walk through grocery store  NEXT MD VISIT: pain management Friday  OBJECTIVE:     PATIENT SURVEYS:  FOTO Primary score 28% with goal of 34% FOTO 03/25/23: 38%  SCREENING FOR RED FLAGS: No  COGNITION: Overall cognitive status: Within functional limits for tasks assessed     SENSATION: P&N throughout ue to hands and le through feet  MUSCLE LENGTH: Hamstrings: full knee extension in sitting   POSTURE: rounded, elevated shoulders, forward head, and flexed trunk   PALPATION: Ms tightness and TTP throughout traps, post cervical spine, erector sp through lumbar spine   Cervical ROM: extension -10d; flex 40d; rotation R/L ~20d LUMBAR ROM:   AROM eval  Flexion 25%  Extension -20d  Right lateral flexion 25%  Left lateral flexion 25%  Right rotation   Left rotation    (Blank rows = not  tested)  LOWER Extremity  WFL  LOWER EXTREMITY MMT:      Strength testing limited by pain MMT Right eval Left eval  Hip flexion 3 3  Hip extension    Hip abduction 3 3  Hip adduction    Hip internal rotation    Hip external rotation    Knee flexion    Knee extension 3 3  Ankle dorsiflexion    Ankle plantarflexion    Ankle inversion    Ankle eversion     (Blank rows = not tested)    FUNCTIONAL TESTS:  5 times sit to stand: unable to tolerate Timed up and go (TUG): 21.61 Berg Balance Scale: 22/56  04/02/23:  TUG 20.72s  GAIT: Distance walked: 200 Assistive device utilized: None Level of assistance: Modified independence Comments: Forward flex positioning, short step length, limited hip/knee flex with heel strike, slowed cadence, guarded  TODAY'S TREATMENT:                                                                                                                              Pt seen for aquatic therapy today.  Treatment took place in water 3.5-4.75 ft in depth at the [facility] pool. Temp of water was 91.  Pt entered/exited the pool via stairs in step-to pattern with bilat rail.  * unsupported: walking forward and backward  in 4+ ft of water- 3 laps. Cues for arm swing, neutral head position, upright chest and heel strike *side stepping x 2 laps with shoulder add/abd,  * feet staggered: bilat horiz shoulder abdct/ addct x 10 *bow and  arrow yellow hand floats, alternating sides x 10 R/L *crouched position with queen nods (with cervical extension and cervical flex) with gentle cervical rotation x 10 each direciton  *vertical decompression using yellow noodle *bilat KTC knees to chest, hands on rail feet on bottom ladder rung/hole * forward step ups onto blue step in 4.85ft. R/L cues for weight shift and engagement of hip/knee flex/ext  - side step ups multiple reps cues for weight shift and engagement of hip/knee flex/ext *stair climbing navigation to exit  pool x 2 sets. Last set pt instructed on alternating pattern.      PATIENT EDUCATION:  Education details: posture awareness, aquatic therapy exercise modifications Person educated: Patient Education method: Explanation Education comprehension: verbalized understanding  HOME EXERCISE PROGRAM: Access Code: [email or web address] URL: [email or web address] Date: 03/16/2023 Prepared by: Geni Bers  Exercises - Seated Cervical Rotation AROM  - 1 x daily - 7 x weekly - 3 sets - 10 reps - Seated Cervical Extension AROM  - 1 x daily - 7 x weekly - 3 sets - 10 reps - Seated Passive Cervical Retraction  - 1 x daily - 7 x weekly - 3 sets - 10 reps - Seated Scapular Retraction  - 1 x daily - 7 x weekly - 3 sets - 10 reps  ASSESSMENT:  CLINICAL IMPRESSION: Continued improvements visually with cervical rotation gain ~ 70% of movement.  Added cervical extension to "queen nods" as extension significantly limited and painful. She demonstrates improvement with proper execution/weight shift with stair climbing. Able to complete 6 steps uisng alternating pattern with heavy ue support rising. Goals ongoing   From eval Patient is a 57 y.o. f who was seen today for physical therapy evaluation and treatment for LBP. She has extensive spinal surgical hx with recurrent radicular pain upper and lower extremities.  She presents today with high pain sensitivity throughout back and extremities. She is greatly limited in cervical through lumbar ROM and does not tolerate strength testing well.  She is a high fall risk.  She is [facility] to ask [facility] a rollator, shower chair and grab bars for commode as she reports difficulty with functional mobility and ADL's. She will benefit from skilled physical therapy intervention to decrease muscle tightness, improve ROM, decrease pain and improve balance using aquatic therapy medium initially. Re-assessments in future will better assess limitations/ability with further  focus on land based PT for improving strength by increased loading for improvement of functional mobility, ADL's and overall QOL.  OBJECTIVE IMPAIRMENTS: Abnormal gait, decreased activity tolerance, decreased balance, decreased endurance, decreased mobility, difficulty walking, decreased ROM, decreased strength, impaired sensation, improper body mechanics, postural dysfunction, obesity, and pain.   ACTIVITY LIMITATIONS: carrying, lifting, bending, sitting, standing, squatting, sleeping, stairs, transfers, bed mobility, bathing, toileting, reach over head, locomotion level, and caring for others  PARTICIPATION LIMITATIONS: meal prep, cleaning, laundry, driving, shopping, community activity, occupation, and yard work  PERSONAL FACTORS: Fitness, Past/current experiences, and Time since onset of injury/illness/exacerbation are also affecting patient's functional outcome.   REHAB POTENTIAL: Good  CLINICAL DECISION MAKING: Evolving/moderate complexity  EVALUATION COMPLEXITY: Moderate   GOALS: Goals reviewed with patient? Yes  SHORT TERM GOALS: Target date: 04/16/23  Pt will tolerate full aquatic sessions consistently without increase in pain and with improving function to demonstrate good toleration and effectiveness of intervention.   Baseline: Goal status: MET - 04/02/23  2.  Pt will improve on Tug test to <or=  15s to demonstrate improvement in lower extremity function, mobility and decreased  fall risk.  Baseline:21.61  Goal status: IN PROGRESS  3.  Pt will obtain DME's as needed to improve safety with ADL's and amb Baseline: needs: rollator; shower chair and grab bars for toilet Goal status: INITIAL  4.  Pt will have a decrease in her minimal pain to </= 4/10 Baseline: 8/10 Goal status: INITIAL  5.  Pt will report completing showering with assist and apprehensiveness of falling Baseline: afraid of falling/not wanting to ask for help Goal status: INITIAL    LONG TERM GOALS:  Target date: 05/05/23  Pt to meet stated Foto Goal of 34% Baseline: 28% Goal status:MET - 03/25/23  2.  Pt will improve strength in all areas listed by  1 grade (MMT, plan to measure using HHD when tolerated)  to demonstrate improved overall physical function  Baseline: see chart Goal status: INITIAL  3.  Pt will improve on Berg balance test to >/=  50/56 to demonstrate a decrease in fall risk. Baseline: 22/56 Goal status: INITIAL  4.  Pt will tolerate walking to and from setting and engaging in aquatic therapy session without excessive fatigue or increase in pain to demonstrate improved toleration to activity.  Baseline: pain and multiple rest periods Goal status: INITIAL  5.  Pt will be able to tolerate walking through grocery store without increase in pain and without seated rest period to demonstrate improved toleration to activity Baseline: unable (personal goal) Goal status: INITIAL  6.  Pt will have an overall decrease in max pain to </= 4/10 for improved QOL Baseline: 8/10 Goal status: INITIAL  PLAN:  PT FREQUENCY: 1-2x/week  PT DURATION: 8 weeks 15 visits  PLANNED INTERVENTIONS: Therapeutic exercises, Therapeutic activity, Neuromuscular re-education, Balance training, Gait training, Patient/Family education, Self Care, Joint mobilization, Stair training, Orthotic/Fit training, DME instructions, Aquatic Therapy, Dry Needling, Cryotherapy, Moist heat, Taping, Ionotophoresis 4mg /ml Dexamethasone, Manual therapy, and Re-evaluation.  PLAN FOR NEXT SESSION: aquatic intervention for general strengthening and mobility, stretching/ROM, pain reduction, posture correction, gait cervical through lumbar spine.  [address] [place]) Toni Lewis MPT 04/13/23 4:25 PM [facility] [address] [place], [place], [address] Phone: [phone]   Fax:  [phone]

## 2023-04-14 ENCOUNTER — Encounter (HOSPITAL_BASED_OUTPATIENT_CLINIC_OR_DEPARTMENT_OTHER): Payer: Self-pay | Admitting: Physical Therapy

## 2023-04-14 ENCOUNTER — Ambulatory Visit (HOSPITAL_BASED_OUTPATIENT_CLINIC_OR_DEPARTMENT_OTHER): Payer: No Typology Code available for payment source | Admitting: Physical Therapy

## 2023-04-14 DIAGNOSIS — M549 Dorsalgia, unspecified: Secondary | ICD-10-CM | POA: Diagnosis not present

## 2023-04-14 DIAGNOSIS — M6281 Muscle weakness (generalized): Secondary | ICD-10-CM

## 2023-04-14 DIAGNOSIS — R293 Abnormal posture: Secondary | ICD-10-CM

## 2023-04-14 NOTE — Therapy (Signed)
OUTPATIENT PHYSICAL THERAPY [facility] TREATMENT    Department of [facility]  [id number] 15 visits approved from 12/18/22-04/17/23 [facility] Contact: Ardelia Mems [phone] Patient Name: Toni Lewis MRN: [id number] DOB:07/01/1966, 57 y.o., female Today's Date: 04/14/2023  END OF SESSION:  PT End of Session - 04/14/23 1025     Visit Number 12    Number of Visits 15    Date for PT Re-Evaluation 05/05/23    [facility]    PT Start Time [phone]    PT Stop Time 1030    PT Time Calculation (min) 39 min    Activity Tolerance Patient tolerated treatment well;Patient limited by pain    Behavior During Therapy [facility] for tasks assessed/performed              Past Medical History:  Diagnosis Date   Anal fissure    Anxiety    Depression    Endometriosis    Headache    Hemorrhoids    Hypertension    Peptic ulcer disease    Past Surgical History:  Procedure Laterality Date   COLONOSCOPY  2022   ENDOMETRIAL ABLATION     LUMBAR LAMINECTOMY/ DECOMPRESSION WITH MET-RX Right 10/30/2021   Procedure: Right Lumbar Three-Four Lumbar Four-Five Laminotomy, Discectomy with metrx;  Surgeon: Barnett Abu, MD;  Location: [facility] OR;  Service: Neurosurgery;  Laterality: Right;   POSTERIOR CERVICAL LAMINECTOMY WITH MET- RX Bilateral 10/30/2021   Procedure: Bilateral Cervical Five-Six Foraminotomy w/Metrx;  Surgeon: Barnett Abu, MD;  Location: [facility] OR;  Service: Neurosurgery;  Laterality: Bilateral;   TONSILLECTOMY     Patient Active Problem List   Diagnosis Date Noted   Cervical disc disorder with radiculopathy of cervical region 10/30/2021   Benign essential HTN 02/01/2014   Chest pain 02/01/2014   Syncope, vasovagal 02/01/2014   Palpitations 02/01/2014    PCP: Greta Doom, MD   REFERRING PROVIDER: Greta Doom, MD   REFERRING DIAG:  M54.9 (ICD-10-CM) - Dorsalgia, unspecified   Rationale for Evaluation and Treatment: Rehabilitation  THERAPY DIAG:  Dorsalgia of  multiple sites in spine  Abnormal posture  Muscle weakness-general  ONSET DATE: >10 yrs  SUBJECTIVE:                                                                                                                                                                                           SUBJECTIVE STATEMENT: "I didn't want to come 2 days in a row but there wasn't anything else open.  Pain in mid back high today which is usual for the next day after a session here."  PERTINENT HISTORY:  10/30/21: Surgeon Sherilyn Cooter  Elsner     LUMBAR LAMINECTOMY/ DECOMPRESSION       POSTERIOR CERVICAL LAMINECTOMY  PAIN:  Are you having pain? Yes: NPRS scale: current 10/10; Pain location: general  Pain description: N&T arms; right foot numbness, mid back to neck sharp Aggravating factors: walking, standing, showering, toileting Relieving factors: nothing right now; meds; ice packs  PRECAUTIONS: Cervical and Back  WEIGHT BEARING RESTRICTIONS: No  FALLS:  Has patient fallen in last 6 months? No  LIVING ENVIRONMENT: Lives with: lives with their family Lives in: House/apartment Stairs: No Has following equipment at home: Single point cane  OCCUPATION: disabled  PLOF: Needs assistance with ADLs  PATIENT GOALS: decrease pain, improve QOL, walk through grocery store  NEXT MD VISIT: pain management Friday  OBJECTIVE:     PATIENT SURVEYS:  FOTO Primary score 28% with goal of 34% FOTO 03/25/23: 38%  SCREENING FOR RED FLAGS: No  COGNITION: Overall cognitive status: Within functional limits for tasks assessed     SENSATION: P&N throughout ue to hands and le through feet  MUSCLE LENGTH: Hamstrings: full knee extension in sitting   POSTURE: rounded, elevated shoulders, forward head, and flexed trunk   PALPATION: Ms tightness and TTP throughout traps, post cervical spine, erector sp through lumbar spine   Cervical ROM: extension -10d; flex 40d; rotation R/L ~20d LUMBAR ROM:   AROM eval   Flexion 25%  Extension -20d  Right lateral flexion 25%  Left lateral flexion 25%  Right rotation   Left rotation    (Blank rows = not tested)  LOWER Extremity  WFL  LOWER EXTREMITY MMT:      Strength testing limited by pain MMT Right eval Left eval  Hip flexion 3 3  Hip extension    Hip abduction 3 3  Hip adduction    Hip internal rotation    Hip external rotation    Knee flexion    Knee extension 3 3  Ankle dorsiflexion    Ankle plantarflexion    Ankle inversion    Ankle eversion     (Blank rows = not tested)    FUNCTIONAL TESTS:  5 times sit to stand: unable to tolerate Timed up and go (TUG): 21.61 Berg Balance Scale: 22/56  04/02/23:  TUG 20.72s  GAIT: Distance walked: 200 Assistive device utilized: None Level of assistance: Modified independence Comments: Forward flex positioning, short step length, limited hip/knee flex with heel strike, slowed cadence, guarded  TODAY'S TREATMENT:                                                                                                                              Pt seen for aquatic therapy today.  Treatment took place in water 3.5-4.75 ft in depth at the [facility] pool. Temp of water was 91.  Pt entered/exited the pool via stairs in step-to pattern with bilat rail.  * unsupported: walking forward and backward  in 4+ ft of water- 3 laps.  Cues for arm swing, neutral head position, upright chest and heel strike *side stepping x 2 laps with shoulder add/abd,  *vertical decompression using yellow noodle *crouched position with queen nods (with cervical extension and cervical flex) with gentle cervical rotation x 10 each direciton  *bilat KTC knees to chest, hands on rail feet on bottom ladder rung/hole *decompression * feet staggered: bilat horiz shoulder abdct/ addct x 10 *bow and arrow yellow hand floats, alternating sides x 10 R/L *decompression   PATIENT EDUCATION:  Education details: posture  awareness, aquatic therapy exercise modifications Person educated: Patient Education method: Explanation Education comprehension: verbalized understanding  HOME EXERCISE PROGRAM: Access Code: [email or web address] URL: [email or web address] Date: 03/16/2023 Prepared by: Geni Bers  Exercises - Seated Cervical Rotation AROM  - 1 x daily - 7 x weekly - 3 sets - 10 reps - Seated Cervical Extension AROM  - 1 x daily - 7 x weekly - 3 sets - 10 reps - Seated Passive Cervical Retraction  - 1 x daily - 7 x weekly - 3 sets - 10 reps - Seated Scapular Retraction  - 1 x daily - 7 x weekly - 3 sets - 10 reps  ASSESSMENT:  CLINICAL IMPRESSION: Pt with increased pain sx today likely due to recent session yesterday.  Not ideal with visits 2 days in row but pt highly motivated and there was not another option. Added time in vertical decompression for pain relief and avoided stair climbing training as it increases discomfort. Of note descending steps to enter pool with improved execution. She continues to rely heavily on ue. Reports lowest pain since beginning therapy was 5/10. She also reports she has requested but has not yet received a shower chair nor a rollator from the [place]. She is showering indep (standing). Reduction in pain today from 10/10 to 7/10 upon completion.    From eval Patient is a 57 y.o. f who was seen today for physical therapy evaluation and treatment for LBP. She has extensive spinal surgical hx with recurrent radicular pain upper and lower extremities.  She presents today with high pain sensitivity throughout back and extremities. She is greatly limited in cervical through lumbar ROM and does not tolerate strength testing well.  She is a high fall risk.  She is [facility], shower chair and grab bars for commode as she reports difficulty with functional mobility and ADL's. She will benefit from skilled physical therapy intervention to decrease muscle  tightness, improve ROM, decrease pain and improve balance using aquatic therapy medium initially. Re-assessments in future will better assess limitations/ability with further focus on land based PT for improving strength by increased loading for improvement of functional mobility, ADL's and overall QOL.  OBJECTIVE IMPAIRMENTS: Abnormal gait, decreased activity tolerance, decreased balance, decreased endurance, decreased mobility, difficulty walking, decreased ROM, decreased strength, impaired sensation, improper body mechanics, postural dysfunction, obesity, and pain.   ACTIVITY LIMITATIONS: carrying, lifting, bending, sitting, standing, squatting, sleeping, stairs, transfers, bed mobility, bathing, toileting, reach over head, locomotion level, and caring for others  PARTICIPATION LIMITATIONS: meal prep, cleaning, laundry, driving, shopping, community activity, occupation, and yard work  PERSONAL FACTORS: Fitness, Past/current experiences, and Time since onset of injury/illness/exacerbation are also affecting patient's functional outcome.   REHAB POTENTIAL: Good  CLINICAL DECISION MAKING: Evolving/moderate complexity  EVALUATION COMPLEXITY: Moderate   GOALS: Goals reviewed with patient? Yes  SHORT TERM GOALS: Target date: 04/16/23  Pt will tolerate full aquatic sessions consistently without increase in pain and with  improving function to demonstrate good toleration and effectiveness of intervention.   Baseline: Goal status: MET - 04/02/23  2.  Pt will improve on Tug test to <or=  15s to demonstrate improvement in lower extremity function, mobility and decreased fall risk.  Baseline:21.61  Goal status: IN PROGRESS 04/14/23  3.  Pt will obtain DME's as needed to improve safety with ADL's and amb Baseline: needs: rollator; shower chair and grab bars for toilet Goal status: INITIAL  4.  Pt will have a decrease in her minimal pain to </= 4/10 Baseline: 8/10 Goal status: In progress (5/10  04/14/23)  5.  Pt will report completing showering without assist and apprehensiveness of falling Baseline: afraid of falling/not wanting to ask for help Goal status: Met 04/14/23    LONG TERM GOALS: Target date: 05/05/23  Pt to meet stated Foto Goal of 34% Baseline: 28% Goal status:MET - 03/25/23  2.  Pt will improve strength in all areas listed by  1 grade (MMT, plan to measure using HHD when tolerated)  to demonstrate improved overall physical function  Baseline: see chart Goal status: INITIAL  3.  Pt will improve on Berg balance test to >/=  50/56 to demonstrate a decrease in fall risk. Baseline: 22/56 Goal status: INITIAL  4.  Pt will tolerate walking to and from setting and engaging in aquatic therapy session without excessive fatigue or increase in pain to demonstrate improved toleration to activity.  Baseline: pain and multiple rest periods Goal status: INITIAL  5.  Pt will be able to tolerate walking through grocery store without increase in pain and without seated rest period to demonstrate improved toleration to activity Baseline: unable (personal goal) Goal status: INITIAL  6.  Pt will have an overall decrease in max pain to </= 4/10 for improved QOL Baseline: 8/10 Goal status: INITIAL  PLAN:  PT FREQUENCY: 1-2x/week  PT DURATION: 8 weeks 15 visits  PLANNED INTERVENTIONS: Therapeutic exercises, Therapeutic activity, Neuromuscular re-education, Balance training, Gait training, Patient/Family education, Self Care, Joint mobilization, Stair training, Orthotic/Fit training, DME instructions, Aquatic Therapy, Dry Needling, Cryotherapy, Moist heat, Taping, Ionotophoresis 4mg /ml Dexamethasone, Manual therapy, and Re-evaluation.  PLAN FOR NEXT SESSION: aquatic intervention for general strengthening and mobility, stretching/ROM, pain reduction, posture correction, gait cervical through lumbar spine.  Corrie Dandy [place]) Usher Hedberg MPT 04/14/23 10:29 AM [facility]  [facility] [address] [place], [place], [address] Phone: [phone]   Fax:  [phone]

## 2023-04-21 ENCOUNTER — Encounter (HOSPITAL_BASED_OUTPATIENT_CLINIC_OR_DEPARTMENT_OTHER): Payer: Self-pay | Admitting: Physical Therapy

## 2023-04-21 ENCOUNTER — Ambulatory Visit (HOSPITAL_BASED_OUTPATIENT_CLINIC_OR_DEPARTMENT_OTHER): Payer: PRIVATE HEALTH INSURANCE | Attending: Internal Medicine | Admitting: Physical Therapy

## 2023-04-21 DIAGNOSIS — R293 Abnormal posture: Secondary | ICD-10-CM | POA: Diagnosis present

## 2023-04-21 DIAGNOSIS — M6281 Muscle weakness (generalized): Secondary | ICD-10-CM | POA: Diagnosis present

## 2023-04-21 DIAGNOSIS — M549 Dorsalgia, unspecified: Secondary | ICD-10-CM | POA: Insufficient documentation

## 2023-04-21 NOTE — Therapy (Signed)
OUTPATIENT PHYSICAL THERAPY [facility] TREATMENT    Department of [facility]  [id number] 15 visits approved from 12/18/22-04/17/23 [facility] Contact: Ardelia Mems [phone] Patient Name: Toni Lewis MRN: [id number] DOB:1965/12/14, 57 y.o., female Today's Date: 04/21/2023  END OF SESSION:  PT End of Session - 04/21/23 0959     Visit Number 13    Number of Visits 15    Date for PT Re-Evaluation 05/05/23    [facility]ization Type [facility]    PT Start Time [phone]    PT Stop Time 1029    PT Time Calculation (min) 42 min    Behavior During Therapy [facility] for tasks assessed/performed              Past Medical History:  Diagnosis Date   Anal fissure    Anxiety    Depression    Endometriosis    Headache    Hemorrhoids    Hypertension    Peptic ulcer disease    Past Surgical History:  Procedure Laterality Date   COLONOSCOPY  2022   ENDOMETRIAL ABLATION     LUMBAR LAMINECTOMY/ DECOMPRESSION WITH MET-RX Right 10/30/2021   Procedure: Right Lumbar Three-Four Lumbar Four-Five Laminotomy, Discectomy with metrx;  Surgeon: Barnett Abu, MD;  Location: [facility] OR;  Service: Neurosurgery;  Laterality: Right;   POSTERIOR CERVICAL LAMINECTOMY WITH MET- RX Bilateral 10/30/2021   Procedure: Bilateral Cervical Five-Six Foraminotomy w/Metrx;  Surgeon: Barnett Abu, MD;  Location: [facility] OR;  Service: Neurosurgery;  Laterality: Bilateral;   TONSILLECTOMY     Patient Active Problem List   Diagnosis Date Noted   Cervical disc disorder with radiculopathy of cervical region 10/30/2021   Benign essential HTN 02/01/2014   Chest pain 02/01/2014   Syncope, vasovagal 02/01/2014   Palpitations 02/01/2014    PCP: Greta Doom, MD   REFERRING PROVIDER: Greta Doom, MD   REFERRING DIAG:  M54.9 (ICD-10-CM) - Dorsalgia, unspecified   Rationale for Evaluation and Treatment: Rehabilitation  THERAPY DIAG:  Dorsalgia of multiple sites in spine  Abnormal posture  Muscle weakness-general  ONSET  DATE: >10 yrs  SUBJECTIVE:                                                                                                                                                                                           SUBJECTIVE STATEMENT: Pt reports she has not received rollator from [place] yet.  [facility] and about getting an extension for therapy.  Currently does not have access to pool.   PERTINENT HISTORY:  10/30/21: Surgeon Barnett Abu     LUMBAR LAMINECTOMY/ DECOMPRESSION  POSTERIOR CERVICAL LAMINECTOMY  PAIN:  Are you having pain? Yes: NPRS scale: current 7/10; Pain location: generalized Pain description: N&T arms; right foot numbness, mid back to neck sharp Aggravating factors: walking, standing, showering, toileting Relieving factors: nothing right now; meds; ice packs  PRECAUTIONS: Cervical and Back  WEIGHT BEARING RESTRICTIONS: No  FALLS:  Has patient fallen in last 6 months? No  LIVING ENVIRONMENT: Lives with: lives with their family Lives in: House/apartment Stairs: No Has following equipment at home: Single point cane  OCCUPATION: disabled  PLOF: Needs assistance with ADLs  PATIENT GOALS: decrease pain, improve QOL, walk through grocery store  NEXT MD VISIT: pain management Friday  OBJECTIVE:     PATIENT SURVEYS:  FOTO Primary score 28% with goal of 34% FOTO 03/25/23: 38%  SCREENING FOR RED FLAGS: No  COGNITION: Overall cognitive status: Within functional limits for tasks assessed     SENSATION: P&N throughout ue to hands and le through feet  MUSCLE LENGTH: Hamstrings: full knee extension in sitting   POSTURE: rounded, elevated shoulders, forward head, and flexed trunk   PALPATION: Ms tightness and TTP throughout traps, post cervical spine, erector sp through lumbar spine   Cervical ROM: extension -10d; flex 40d; rotation R/L ~20d LUMBAR ROM:   AROM eval  Flexion 25%  Extension -20d  Right lateral flexion  25%  Left lateral flexion 25%  Right rotation   Left rotation    (Blank rows = not tested)  LOWER Extremity  WFL  LOWER EXTREMITY MMT:      Strength testing limited by pain MMT Right eval Left eval  Hip flexion 3 3  Hip extension    Hip abduction 3 3  Hip adduction    Hip internal rotation    Hip external rotation    Knee flexion    Knee extension 3 3  Ankle dorsiflexion    Ankle plantarflexion    Ankle inversion    Ankle eversion     (Blank rows = not tested)    FUNCTIONAL TESTS:  5 times sit to stand: unable to tolerate Timed up and go (TUG): 21.61 Berg Balance Scale: 22/56  04/02/23:  TUG 20.72s  GAIT: Distance walked: 200 Assistive device utilized: None Level of assistance: Modified independence Comments: Forward flex positioning, short step length, limited hip/knee flex with heel strike, slowed cadence, guarded  TODAY'S TREATMENT:                                                                                                                              Pt seen for aquatic therapy today.  Treatment took place in water 3.5-4.75 ft in depth at the [facility] pool. Temp of water was 91.  Pt entered/exited the pool via stairs in step-to pattern with bilat rail.  * UE on yellow hand floats:  walking forward and backward  in 4+ ft of water- 3 laps. Cues for more upright trunk and neutral head position,heel strike;  short crouched rest breat *side stepping x 2 laps with shoulder add/abd, progressed to rainbow hand floats with addct x 2 laps * wide stance with tricep press downs with rainbow hand floats x 12 * back against wall with bilat shoulder horiz abdct x 10 for pec stretch *bow and arrow rainbow hand floats, alternating sides x 10 R/L * squats pushing rainbow hand float under water x 5, with cues on breath  * semi tandem stance without support x 10-15s (challenge)  * SLS x 10s each LE * return to walking with arm swing forward/backward  * staggered  stance with bilat shoulder flex holding short noodle off of water x 3 each leg forward  * slow queen nods for neck extensor stretch    PATIENT EDUCATION:  Education details: posture awareness, aquatic therapy exercise modifications Person educated: Patient Education method: Explanation Education comprehension: verbalized understanding  HOME EXERCISE PROGRAM: Access Code: [email or web address] URL: [email or web address] Date: 03/16/2023 Prepared by: Geni Bers  Exercises - Seated Cervical Rotation AROM  - 1 x daily - 7 x weekly - 3 sets - 10 reps - Seated Cervical Extension AROM  - 1 x daily - 7 x weekly - 3 sets - 10 reps - Seated Passive Cervical Retraction  - 1 x daily - 7 x weekly - 3 sets - 10 reps - Seated Scapular Retraction  - 1 x daily - 7 x weekly - 3 sets - 10 reps  ASSESSMENT:  CLINICAL IMPRESSION: Pt able to progress exercises with addition of resistance of rainbow hand floats submerged in water, with good tolerance.  Pain rating decreased to 5/10 until completing warrior 1-like exercise with bilat UE raising above 90 deg flexion which increased pain in head/neck to 10/10.  Encouraged stretching and self care after today's session.  She will benefit from skilled physical therapy intervention to decrease muscle tightness, improve ROM, decrease pain and improve balance using aquatic therapy medium initially.  Pt is progressing gradually towards LTGs.   Begin d/c planning in event additional visits are not approved.    OBJECTIVE IMPAIRMENTS: Abnormal gait, decreased activity tolerance, decreased balance, decreased endurance, decreased mobility, difficulty walking, decreased ROM, decreased strength, impaired sensation, improper body mechanics, postural dysfunction, obesity, and pain.   ACTIVITY LIMITATIONS: carrying, lifting, bending, sitting, standing, squatting, sleeping, stairs, transfers, bed mobility, bathing, toileting, reach over head, locomotion level, and caring  for others  PARTICIPATION LIMITATIONS: meal prep, cleaning, laundry, driving, shopping, community activity, occupation, and yard work  PERSONAL FACTORS: Fitness, Past/current experiences, and Time since onset of injury/illness/exacerbation are also affecting patient's functional outcome.   REHAB POTENTIAL: Good  CLINICAL DECISION MAKING: Evolving/moderate complexity  EVALUATION COMPLEXITY: Moderate   GOALS: Goals reviewed with patient? Yes  SHORT TERM GOALS: Target date: 04/16/23  Pt will tolerate full aquatic sessions consistently without increase in pain and with improving function to demonstrate good toleration and effectiveness of intervention.   Baseline: Goal status: MET - 04/02/23  2.  Pt will improve on Tug test to <or=  15s to demonstrate improvement in lower extremity function, mobility and decreased fall risk.  Baseline:21.61  Goal status: IN PROGRESS 04/14/23  3.  Pt will obtain DME's as needed to improve safety with ADL's and amb Baseline: needs: rollator; shower chair and grab bars for toilet Goal status: INITIAL  4.  Pt will have a decrease in her minimal pain to </= 4/10 Baseline: 8/10 Goal status: In progress (5/10 04/14/23)  5.  Pt will report completing showering without assist  and apprehensiveness of falling Baseline: afraid of falling/not wanting to ask for help Goal status: Met 04/14/23    LONG TERM GOALS: Target date: 05/05/23  Pt to meet stated Foto Goal of 34% Baseline: 28% Goal status:MET - 03/25/23  2.  Pt will improve strength in all areas listed by  1 grade (MMT, plan to measure using HHD when tolerated)  to demonstrate improved overall physical function  Baseline: see chart Goal status: INITIAL  3.  Pt will improve on Berg balance test to >/=  50/56 to demonstrate a decrease in fall risk. Baseline: 22/56 Goal status: INITIAL  4.  Pt will tolerate walking to and from setting and engaging in aquatic therapy session without excessive fatigue or  increase in pain to demonstrate improved toleration to activity.  Baseline: pain and multiple rest periods Goal status: INITIAL  5.  Pt will be able to tolerate walking through grocery store without increase in pain and without seated rest period to demonstrate improved toleration to activity Baseline: unable (personal goal) Goal status: INITIAL  6.  Pt will have an overall decrease in max pain to </= 4/10 for improved QOL Baseline: 8/10 Goal status: INITIAL  PLAN:  PT FREQUENCY: 1-2x/week  PT DURATION: 8 weeks 15 visits  PLANNED INTERVENTIONS: Therapeutic exercises, Therapeutic activity, Neuromuscular re-education, Balance training, Gait training, Patient/Family education, Self Care, Joint mobilization, Stair training, Orthotic/Fit training, DME instructions, Aquatic Therapy, Dry Needling, Cryotherapy, Moist heat, Taping, Ionotophoresis 4mg /ml Dexamethasone, Manual therapy, and Re-evaluation.  PLAN FOR NEXT SESSION: aquatic intervention for general strengthening and mobility, stretching/ROM, pain reduction, posture correction, gait cervical through lumbar spine.  Mayer Camel, PTA 04/21/23 11:15 AM [facility] [address] [place], [place], [address] Phone: [phone]   Fax:  [phone]

## 2023-04-23 ENCOUNTER — Ambulatory Visit (HOSPITAL_BASED_OUTPATIENT_CLINIC_OR_DEPARTMENT_OTHER): Payer: PRIVATE HEALTH INSURANCE | Admitting: Physical Therapy

## 2023-04-23 ENCOUNTER — Encounter (HOSPITAL_BASED_OUTPATIENT_CLINIC_OR_DEPARTMENT_OTHER): Payer: Self-pay | Admitting: Physical Therapy

## 2023-04-23 DIAGNOSIS — R293 Abnormal posture: Secondary | ICD-10-CM

## 2023-04-23 DIAGNOSIS — M6281 Muscle weakness (generalized): Secondary | ICD-10-CM

## 2023-04-23 DIAGNOSIS — M549 Dorsalgia, unspecified: Secondary | ICD-10-CM | POA: Diagnosis not present

## 2023-04-23 NOTE — Therapy (Signed)
OUTPATIENT PHYSICAL THERAPY THORACOLUMBAR TREATMENT/re-cert  Progress Note Reporting Period 03/10/23 to 04/23/23  See note below for Objective Data and Assessment of Progress/Goals.         Department of [facility]  [id number] 15 visits approved from 12/18/22-04/17/23 [facility] Contact: Ardelia Mems [phone] Patient Name: Toni Lewis MRN: [id number] DOB:13-Jan-1966, 57 y.o., female Today's Date: 04/23/2023  END OF SESSION:  PT End of Session - 04/23/23 0948     Visit Number 14    Number of Visits 15    Date for PT Re-Evaluation 06/18/23    Authorization Type [facility]    PT Start Time 0945    PT Stop Time 1030    PT Time Calculation (min) 45 min    Activity Tolerance Patient tolerated treatment well    Behavior During Therapy [facility] for tasks assessed/performed              Past Medical History:  Diagnosis Date   Anal fissure    Anxiety    Depression    Endometriosis    Headache    Hemorrhoids    Hypertension    Peptic ulcer disease    Past Surgical History:  Procedure Laterality Date   COLONOSCOPY  2022   ENDOMETRIAL ABLATION     LUMBAR LAMINECTOMY/ DECOMPRESSION WITH MET-RX Right 10/30/2021   Procedure: Right Lumbar Three-Four Lumbar Four-Five Laminotomy, Discectomy with metrx;  Surgeon: Barnett Abu, MD;  Location: [facility] OR;  Service: Neurosurgery;  Laterality: Right;   POSTERIOR CERVICAL LAMINECTOMY WITH MET- RX Bilateral 10/30/2021   Procedure: Bilateral Cervical Five-Six Foraminotomy w/Metrx;  Surgeon: Barnett Abu, MD;  Location: [facility] OR;  Service: Neurosurgery;  Laterality: Bilateral;   TONSILLECTOMY     Patient Active Problem List   Diagnosis Date Noted   Cervical disc disorder with radiculopathy of cervical region 10/30/2021   Benign essential HTN 02/01/2014   Chest pain 02/01/2014   Syncope, vasovagal 02/01/2014   Palpitations 02/01/2014    PCP: Greta Doom, MD   REFERRING PROVIDER: Greta Doom, MD   REFERRING DIAG:  M54.9 (ICD-10-CM)  - Dorsalgia, unspecified   Rationale for Evaluation and Treatment: Rehabilitation  THERAPY DIAG:  Dorsalgia of multiple sites in spine  Abnormal posture  Muscle weakness-general  ONSET DATE: >10 yrs  SUBJECTIVE:                                                                                                                                                                                           SUBJECTIVE STATEMENT: "I'm a good 6 today"  PERTINENT HISTORY:  10/30/21: Surgeon Barnett Abu     LUMBAR LAMINECTOMY/ DECOMPRESSION  POSTERIOR CERVICAL LAMINECTOMY  PAIN:  Are you having pain? Yes: NPRS scale: current 6/10; Pain location: generalized Pain description: N&T arms; right foot numbness, mid back to neck sharp Aggravating factors: walking, standing, showering, toileting Relieving factors: nothing right now; meds; ice packs  PRECAUTIONS: Cervical and Back  WEIGHT BEARING RESTRICTIONS: No  FALLS:  Has patient fallen in last 6 months? No  LIVING ENVIRONMENT: Lives with: lives with their family Lives in: House/apartment Stairs: No Has following equipment at home: Single point cane  OCCUPATION: disabled  PLOF: Needs assistance with ADLs  PATIENT GOALS: decrease pain, improve QOL, walk through grocery store  NEXT MD VISIT: pain management Friday  OBJECTIVE:     PATIENT SURVEYS:  FOTO Primary score 28% with goal of 34% FOTO 03/25/23: 38%  SCREENING FOR RED FLAGS: No  COGNITION: Overall cognitive status: Within functional limits for tasks assessed     SENSATION: P&N throughout ue to hands and le through feet  MUSCLE LENGTH: Hamstrings: full knee extension in sitting   POSTURE: rounded, elevated shoulders, forward head, and flexed trunk   PALPATION: Ms tightness and TTP throughout traps, post cervical spine, erector sp through lumbar spine   Cervical ROM: extension -10d; flex 40d; rotation R/L ~20d LUMBAR ROM:   AROM eval 04/23/23  Flexion 25%  full  Extension -20d -5d  Right lateral flexion 25%   Left lateral flexion 25%   Right rotation  ~20d  Left rotation  ~20d   (Blank rows = not tested)  LOWER Extremity  WFL  LOWER EXTREMITY MMT:      Strength testing limited by pain MMT Right eval Left eval Right / Left 04/23/23  Hip flexion 3 3 3+/5  Hip extension     Hip abduction 3 3 4/5  Hip adduction     Hip internal rotation     Hip external rotation     Knee flexion     Knee extension 3 3 4/5  Ankle dorsiflexion     Ankle plantarflexion     Ankle inversion     Ankle eversion      (Blank rows = not tested)    FUNCTIONAL TESTS:  5 times sit to stand: unable to tolerate Timed up and go (TUG): 21.61 Berg Balance Scale: 22/56  04/02/23:  TUG 20.72s  04/23/23: Sharlene Motts: 32/56   5 x STS:26.24 with  GAIT: Distance walked: 200 Assistive device utilized: None Level of assistance: Modified independence Comments: Forward flex positioning, short step length, limited hip/knee flex with heel strike, slowed cadence, guarded  TODAY'S TREATMENT:                                                                                                                              Pt seen for aquatic therapy today.  Treatment took place in water 3.5-4.75 ft in depth at the [facility] pool. Temp of water was 91.  Pt entered/exited the pool via stairs in step-to pattern  with bilat rail.  *unsupported with arm swing:  walking forward and backward  in 4+ ft  Cues for more upright trunk and  *side stepping x 4 laps with shoulder add/abd, progressed to rainbow hand floats with addct x 4 laps *bilat shoulder horiz abdct x 10 for pec stretch *bow and arrow rainbow hand floats, alternating sides x 10 R/L * squats pushing rainbow hand float under water x 5, with cues on breath  * slow queen nods for neck extensor stretch * Walking with arm swing forward/backward between exercises for recovery *short crouched position for  recovery    PATIENT EDUCATION:  Education details: posture awareness, aquatic therapy exercise modifications Person educated: Patient Education method: Explanation Education comprehension: verbalized understanding  HOME EXERCISE PROGRAM: Access Code: [email or web address] URL: [email or web address] Date: 03/16/2023 Prepared by: Geni Bers  Exercises - Seated Cervical Rotation AROM  - 1 x daily - 7 x weekly - 3 sets - 10 reps - Seated Cervical Extension AROM  - 1 x daily - 7 x weekly - 3 sets - 10 reps - Seated Passive Cervical Retraction  - 1 x daily - 7 x weekly - 3 sets - 10 reps - Seated Scapular Retraction  - 1 x daily - 7 x weekly - 3 sets - 10 reps  ASSESSMENT:  CLINICAL IMPRESSION: Re-assessment: Pt has not yet had a decrease in max pain. Max pain reported in past week 8/10 least 6/10. She reports she can see improvements in cervical Rom and lumbar bending. She reports a fall a few days ago jumping OOB with a cramp and legs gave. She has not yet gotten any DME as she would obtain from [place] and has not been back . Stair climbing improvement with proper execution and min UE support. She demonstrates improvement with endurance and toleration to amb as she is only stopping/pausing x 1 walking to and then from setting. As per Sharlene Motts test she has had a decrease in fall risk with improved balance and she was able to tolerate 5 x STS which was not tolerated at initial evaluation. Cervical ROM improved.  She will continue to benefit from skilled physical therapy intervention.  Will add land based intervention for increased load progressing strength Le and core, address cervical ROM, instruction on stair climbing and DN as indicated. Will alternate with aquatics for optimal progression towards land based goals     OBJECTIVE IMPAIRMENTS: Abnormal gait, decreased activity tolerance, decreased balance, decreased endurance, decreased mobility, difficulty walking, decreased ROM, decreased  strength, impaired sensation, improper body mechanics, postural dysfunction, obesity, and pain.   ACTIVITY LIMITATIONS: carrying, lifting, bending, sitting, standing, squatting, sleeping, stairs, transfers, bed mobility, bathing, toileting, reach over head, locomotion level, and caring for others  PARTICIPATION LIMITATIONS: meal prep, cleaning, laundry, driving, shopping, community activity, occupation, and yard work  PERSONAL FACTORS: Fitness, Past/current experiences, and Time since onset of injury/illness/exacerbation are also affecting patient's functional outcome.   REHAB POTENTIAL: Good  CLINICAL DECISION MAKING: Evolving/moderate complexity  EVALUATION COMPLEXITY: Moderate   GOALS: Goals reviewed with patient? Yes  SHORT TERM GOALS: Target date: 04/16/23  Pt will tolerate full aquatic sessions consistently without increase in pain and with improving function to demonstrate good toleration and effectiveness of intervention.   Baseline: Goal status: MET - 04/02/23  2.  Pt will improve on Tug test to <or=  15s to demonstrate improvement in lower extremity function, mobility and decreased fall risk.  Baseline:21.61  Goal status: IN PROGRESS 04/14/23  3.  Pt will  obtain DME's as needed to improve safety with ADL's and amb Baseline: needs: rollator; shower chair and grab bars for toilet Goal status: INITIAL  4.  Pt will have a decrease in her minimal pain to </= 4/10 Baseline: 8/10 Goal status: In progress (5/10 04/14/23)  5.  Pt will report completing showering without assist and apprehensiveness of falling Baseline: afraid of falling/not wanting to ask for help Goal status: Met 04/14/23    LONG TERM GOALS: Target date: 05/05/23  Pt to meet stated Foto Goal of 34% Baseline: 28% Goal status:MET - 03/25/23  2.  Pt will improve strength in all areas listed by  1 grade (MMT, plan to measure using HHD when tolerated)  to demonstrate improved overall physical function  Baseline:  see chart Goal status: INITIAL  3.  Pt will improve on Berg balance test to >/=  50/56 to demonstrate a decrease in fall risk. Baseline: 22/56 Goal status: In progress (32/56 04/23/23)  4.  Pt will tolerate walking to and from setting and engaging in aquatic therapy session without excessive fatigue or increase in pain to demonstrate improved toleration to activity.  Baseline: pain and multiple rest periods Goal status: Met 04/23/23  5.  Pt will be able to tolerate walking through grocery store without increase in pain and without seated rest period to demonstrate improved toleration to activity Baseline: unable (personal goal) Goal status: ongoing 04/23/23  6.  Pt will have an overall decrease in max pain to </= 4/10 for improved QOL Baseline: 8/10 Goal status: In progress 04/23/23  PLAN:  PT FREQUENCY: 1-2x/week  PT DURATION: 8 weeks   PLANNED INTERVENTIONS: Therapeutic exercises, Therapeutic activity, Neuromuscular re-education, Balance training, Gait training, Patient/Family education, Self Care, Joint mobilization, Stair training, Orthotic/Fit training, DME instructions, Aquatic Therapy, Dry Needling, Cryotherapy, Moist heat, Taping, Ionotophoresis 4mg /ml Dexamethasone, Manual therapy, and Re-evaluation.  PLAN FOR NEXT SESSION: aquatic intervention for general strengthening and mobility, stretching/ROM, pain reduction, posture correction, gait cervical through lumbar spine. Land: DN as approp upper traps; core/le strengthening; stair climbing; posture correction  Rushie Chestnut) Dajanae Brophy MPT 04/23/23 3:56 PM [facility] [address] [place], [place], [address] Phone: [phone]   Fax:  [phone]

## 2023-04-27 ENCOUNTER — Encounter (HOSPITAL_BASED_OUTPATIENT_CLINIC_OR_DEPARTMENT_OTHER): Payer: Self-pay | Admitting: Physical Therapy

## 2023-04-27 ENCOUNTER — Ambulatory Visit (HOSPITAL_BASED_OUTPATIENT_CLINIC_OR_DEPARTMENT_OTHER): Payer: PRIVATE HEALTH INSURANCE | Admitting: Physical Therapy

## 2023-04-27 DIAGNOSIS — M6281 Muscle weakness (generalized): Secondary | ICD-10-CM

## 2023-04-27 DIAGNOSIS — M549 Dorsalgia, unspecified: Secondary | ICD-10-CM | POA: Diagnosis not present

## 2023-04-27 DIAGNOSIS — R293 Abnormal posture: Secondary | ICD-10-CM

## 2023-04-27 NOTE — Therapy (Signed)
OUTPATIENT PHYSICAL THERAPY [facility] TREATMENT  Department of [facility]  [id number] 15 visits approved from 12/18/22-04/17/23 [facility] Contact: Ardelia Mems [phone] Patient Name: Toni Lewis MRN: [id number] DOB:1966-09-15, 57 y.o., female Today's Date: 04/27/2023  END OF SESSION:  PT End of Session - 04/27/23 1214     Visit Number 15    Number of Visits 28    Date for PT Re-Evaluation 06/18/23    Authorization Type [facility]    PT Start Time 1204    PT Stop Time 1245    PT Time Calculation (min) 41 min    Behavior During Therapy [facility] for tasks assessed/performed              Past Medical History:  Diagnosis Date   Anal fissure    Anxiety    Depression    Endometriosis    Headache    Hemorrhoids    Hypertension    Peptic ulcer disease    Past Surgical History:  Procedure Laterality Date   COLONOSCOPY  2022   ENDOMETRIAL ABLATION     LUMBAR LAMINECTOMY/ DECOMPRESSION WITH MET-RX Right 10/30/2021   Procedure: Right Lumbar Three-Four Lumbar Four-Five Laminotomy, Discectomy with metrx;  Surgeon: Barnett Abu, MD;  Location: [facility] OR;  Service: Neurosurgery;  Laterality: Right;   POSTERIOR CERVICAL LAMINECTOMY WITH MET- RX Bilateral 10/30/2021   Procedure: Bilateral Cervical Five-Six Foraminotomy w/Metrx;  Surgeon: Barnett Abu, MD;  Location: [facility] OR;  Service: Neurosurgery;  Laterality: Bilateral;   TONSILLECTOMY     Patient Active Problem List   Diagnosis Date Noted   Cervical disc disorder with radiculopathy of cervical region 10/30/2021   Benign essential HTN 02/01/2014   Chest pain 02/01/2014   Syncope, vasovagal 02/01/2014   Palpitations 02/01/2014    PCP: Greta Doom, MD   REFERRING PROVIDER: Greta Doom, MD   REFERRING DIAG:  M54.9 (ICD-10-CM) - Dorsalgia, unspecified   Rationale for Evaluation and Treatment: Rehabilitation  THERAPY DIAG:  Dorsalgia of multiple sites in spine  Abnormal posture  Muscle weakness-general  ONSET  DATE: >10 yrs  SUBJECTIVE:                                                                                                                                                                                           SUBJECTIVE STATEMENT: Pt reports she has not decided on a pool to go to yet.  She states she was told that the pool of [employer] would be extra charge on top of membership.  She reports that her mom is out of town, so she is on her own.   PERTINENT HISTORY:  10/30/21: Surgeon  Barnett Abu     LUMBAR LAMINECTOMY/ DECOMPRESSION       POSTERIOR CERVICAL LAMINECTOMY  PAIN:  Are you having pain? Yes: NPRS scale: current 6/10; Pain location: generalized Pain description: N&T arms; right foot numbness, mid back to neck sharp Aggravating factors: walking, standing, showering, toileting Relieving factors: nothing right now; meds; ice packs  PRECAUTIONS: Cervical and Back  WEIGHT BEARING RESTRICTIONS: No  FALLS:  Has patient fallen in last 6 months? No  LIVING ENVIRONMENT: Lives with: lives with their family Lives in: House/apartment Stairs: No Has following equipment at home: Single point cane  OCCUPATION: disabled  PLOF: Needs assistance with ADLs  PATIENT GOALS: decrease pain, improve QOL, walk through grocery store  NEXT MD VISIT:   OBJECTIVE:     PATIENT SURVEYS:  FOTO Primary score 28% with goal of 34% FOTO 03/25/23: 38%  SCREENING FOR RED FLAGS: No  COGNITION: Overall cognitive status: Within functional limits for tasks assessed     SENSATION: P&N throughout ue to hands and le through feet  MUSCLE LENGTH: Hamstrings: full knee extension in sitting   POSTURE: rounded, elevated shoulders, forward head, and flexed trunk   PALPATION: Ms tightness and TTP throughout traps, post cervical spine, erector sp through lumbar spine   Cervical ROM: extension -10d; flex 40d; rotation R/L ~20d 04/27/23:  cervical rotation:  L 38, R 31;  Lateral flexion L 15, R  20   LUMBAR ROM:   AROM eval 04/23/23  Flexion 25% full  Extension -20d -5d  Right lateral flexion 25%   Left lateral flexion 25%   Right rotation  ~20d  Left rotation  ~20d   (Blank rows = not tested)  LOWER Extremity  WFL  LOWER EXTREMITY MMT:      Strength testing limited by pain MMT Right eval Left eval Right / Left 04/23/23  Hip flexion 3 3 3+/5  Hip extension     Hip abduction 3 3 4/5  Hip adduction     Hip internal rotation     Hip external rotation     Knee flexion     Knee extension 3 3 4/5  Ankle dorsiflexion     Ankle plantarflexion     Ankle inversion     Ankle eversion      (Blank rows = not tested)    FUNCTIONAL TESTS:  5 times sit to stand: unable to tolerate Timed up and go (TUG): 21.61 Berg Balance Scale: 22/56  04/02/23:  TUG 20.72s  04/23/23: Sharlene Motts: 32/56   5 x STS:26.24 with  GAIT: Distance walked: 200 Assistive device utilized: None Level of assistance: Modified independence Comments: Forward flex positioning, short step length, limited hip/knee flex with heel strike, slowed cadence, guarded  TODAY'S TREATMENT:                                                                                                                              Pt seen for aquatic therapy  today.  Treatment took place in water 3.5-4.75 ft in depth at the [facility] pool. Temp of water was 91.  Pt entered/exited the pool via stairs in step-to pattern with bilat rail.  *unsupported with arm swing:  walking forward and backward  in 4+ ft  Cues for more upright trunk and neutral head  *side stepping x 1 lap, progressed to rainbow hand floats with addct x 3 laps *staggered stance with bilat shoulder horiz abdct x 5 each for pec stretch *alternating bow and arrow rainbow hand floats, moving backwards (stepping into neutral before switching sides) *short crouched position for recovery * staggered stance with TrA set, blue noodle (with hole) pull down to thighs x 5  (challenge) * straight arm to knee tap (in place), then tap to opposite knee x 5 * return to walking forward/backward with arm swing  * marching with alternating gentle row holding rainbow hand floats - forward /backwards  * slow queen nods for neck extensor stretch;  * trial of LE stretch with back against wall and blue noodle under R knee for hamstring stretch (challenge to attain)  PATIENT EDUCATION:  Education details: posture awareness, aquatic therapy exercise modifications Person educated: Patient Education method: Explanation Education comprehension: verbalized understanding  HOME EXERCISE PROGRAM: Access Code: [email or web address] URL: [email or web address] Date: 03/16/2023 Prepared by: Geni Bers  Exercises - Seated Cervical Rotation AROM  - 1 x daily - 7 x weekly - 3 sets - 10 reps - Seated Cervical Extension AROM  - 1 x daily - 7 x weekly - 3 sets - 10 reps - Seated Passive Cervical Retraction  - 1 x daily - 7 x weekly - 3 sets - 10 reps - Seated Scapular Retraction  - 1 x daily - 7 x weekly - 3 sets - 10 reps  ASSESSMENT:  CLINICAL IMPRESSION: Pt demonstrating improved Cervical ROM, but it is still not within normal range.  Trialed exercises with increased resistance with mixed tolerance. Encouraged self-care after session, since we progressed exercises today and she may have more pain.   She will continue to benefit from skilled physical therapy intervention.  Will add land based intervention for increased load progressing strength LE and core, address cervical ROM, instruction on stair climbing and DN as indicated. Will alternate with aquatics for optimal progression towards land based goals.   Awaiting approval from [place] for additional visits.      OBJECTIVE IMPAIRMENTS: Abnormal gait, decreased activity tolerance, decreased balance, decreased endurance, decreased mobility, difficulty walking, decreased ROM, decreased strength, impaired sensation, improper body  mechanics, postural dysfunction, obesity, and pain.   ACTIVITY LIMITATIONS: carrying, lifting, bending, sitting, standing, squatting, sleeping, stairs, transfers, bed mobility, bathing, toileting, reach over head, locomotion level, and caring for others  PARTICIPATION LIMITATIONS: meal prep, cleaning, laundry, driving, shopping, community activity, occupation, and yard work  PERSONAL FACTORS: Fitness, Past/current experiences, and Time since onset of injury/illness/exacerbation are also affecting patient's functional outcome.   REHAB POTENTIAL: Good  CLINICAL DECISION MAKING: Evolving/moderate complexity  EVALUATION COMPLEXITY: Moderate   GOALS: Goals reviewed with patient? Yes  SHORT TERM GOALS: Target date: 04/16/23  Pt will tolerate full aquatic sessions consistently without increase in pain and with improving function to demonstrate good toleration and effectiveness of intervention.   Baseline: Goal status: MET - 04/02/23  2.  Pt will improve on Tug test to <or=  15s to demonstrate improvement in lower extremity function, mobility and decreased fall risk.  Baseline:21.61  Goal status: IN PROGRESS 04/14/23  3.  Pt will obtain DME's as needed to improve safety with ADL's and amb Baseline: needs: rollator; shower chair and grab bars for toilet (has not received rollator from [place] yet) Goal status: In Progress - 04/27/23  4.  Pt will have a decrease in her minimal pain to </= 4/10 Baseline: 8/10 Goal status: In progress (5/10 on 04/14/23)  5.  Pt will report completing showering without assist and apprehensiveness of falling Baseline: afraid of falling/not wanting to ask for help Goal status: Met 04/14/23    LONG TERM GOALS: Target date: 06/18/23  Pt to meet stated Foto Goal of 34% Baseline: 28% Goal status:MET - 03/25/23  2.  Pt will improve strength in all areas listed by  1 grade (MMT, plan to measure using HHD when tolerated)  to demonstrate improved overall physical function   Baseline: see chart Goal status: INITIAL  3.  Pt will improve on Berg balance test to >/=  50/56 to demonstrate a decrease in fall risk. Baseline: 22/56 at eval; (32/56 on 04/23/23) Goal status: In progress   4.  Pt will tolerate walking to and from setting and engaging in aquatic therapy session without excessive fatigue or increase in pain to demonstrate improved toleration to activity.  Baseline: pain and multiple rest periods Goal status: Met 04/23/23  5.  Pt will be able to tolerate walking through grocery store without increase in pain and without seated rest period to demonstrate improved toleration to activity Baseline: unable (personal goal) Goal status: ongoing 04/23/23  6.  Pt will have an overall decrease in max pain to </= 4/10 for improved QOL Baseline: 8/10 Goal status: In progress 04/23/23  PLAN:  PT FREQUENCY: 1-2x/week  PT DURATION: 8 weeks   PLANNED INTERVENTIONS: Therapeutic exercises, Therapeutic activity, Neuromuscular re-education, Balance training, Gait training, Patient/Family education, Self Care, Joint mobilization, Stair training, Orthotic/Fit training, DME instructions, Aquatic Therapy, Dry Needling, Cryotherapy, Moist heat, Taping, Ionotophoresis 4mg /ml Dexamethasone, Manual therapy, and Re-evaluation.  PLAN FOR NEXT SESSION: aquatic intervention for general strengthening and mobility, stretching/ROM, pain reduction, posture correction, gait cervical through lumbar spine. Land: DN as approp upper traps; core/le strengthening; stair climbing; posture correction   Mayer Camel, PTA 04/27/23 12:50 PM [facility] [address] [place], [place], [address] Phone: [phone]   Fax:  [phone]

## 2023-04-29 ENCOUNTER — Ambulatory Visit (HOSPITAL_BASED_OUTPATIENT_CLINIC_OR_DEPARTMENT_OTHER): Payer: PRIVATE HEALTH INSURANCE | Admitting: Physical Therapy

## 2023-04-29 ENCOUNTER — Encounter (HOSPITAL_BASED_OUTPATIENT_CLINIC_OR_DEPARTMENT_OTHER): Payer: Self-pay | Admitting: Physical Therapy

## 2023-04-29 DIAGNOSIS — R293 Abnormal posture: Secondary | ICD-10-CM

## 2023-04-29 DIAGNOSIS — M549 Dorsalgia, unspecified: Secondary | ICD-10-CM | POA: Diagnosis not present

## 2023-04-29 DIAGNOSIS — M6281 Muscle weakness (generalized): Secondary | ICD-10-CM

## 2023-04-29 NOTE — Therapy (Signed)
OUTPATIENT PHYSICAL THERAPY [facility] TREATMENT  Department of [facility]  [id number] 15 visits approved from 12/18/22-04/17/23 [facility] Contact: Ardelia Mems [phone] Patient Name: Toni Lewis MRN: [id number] DOB:[birth year], 57 y.o., female Today's Date: 04/29/2023  END OF SESSION:  PT End of Session - 04/29/23 1042     Visit Number 16    Number of Visits 28    Date for PT Re-Evaluation 06/18/23    Authorization Type VA (for first 15);    PT Start Time 1031    PT Stop Time 1111    PT Time Calculation (min) 40 min    Activity Tolerance Patient tolerated treatment well    Behavior During Therapy [facility] for tasks assessed/performed              Past Medical History:  Diagnosis Date   Anal fissure    Anxiety    Depression    Endometriosis    Headache    Hemorrhoids    Hypertension    Peptic ulcer disease    Past Surgical History:  Procedure Laterality Date   COLONOSCOPY  2022   ENDOMETRIAL ABLATION     LUMBAR LAMINECTOMY/ DECOMPRESSION WITH MET-RX Right 10/30/2021   Procedure: Right Lumbar Three-Four Lumbar Four-Five Laminotomy, Discectomy with metrx;  Surgeon: Barnett Abu, MD;  Location: [facility] OR;  Service: Neurosurgery;  Laterality: Right;   POSTERIOR CERVICAL LAMINECTOMY WITH MET- RX Bilateral 10/30/2021   Procedure: Bilateral Cervical Five-Six Foraminotomy w/Metrx;  Surgeon: Barnett Abu, MD;  Location: [facility] OR;  Service: Neurosurgery;  Laterality: Bilateral;   TONSILLECTOMY     Patient Active Problem List   Diagnosis Date Noted   Cervical disc disorder with radiculopathy of cervical region 10/30/2021   Benign essential HTN 02/01/2014   Chest pain 02/01/2014   Syncope, vasovagal 02/01/2014   Palpitations 02/01/2014    PCP: Greta Doom, MD   REFERRING PROVIDER: Greta Doom, MD   REFERRING DIAG:  M54.9 (ICD-10-CM) - Dorsalgia, unspecified   Rationale for Evaluation and Treatment: Rehabilitation  THERAPY DIAG:  Dorsalgia of multiple  sites in spine  Abnormal posture  Muscle weakness-general  ONSET DATE: >10 yrs  SUBJECTIVE:                                                                                                                                                                                           SUBJECTIVE STATEMENT: Pt reports she had increased pain to9/10 after last session.  Just layed low and pain returned down to 7/10.    PERTINENT HISTORY:  10/30/21: Surgeon Barnett Abu     LUMBAR LAMINECTOMY/ DECOMPRESSION  POSTERIOR CERVICAL LAMINECTOMY  PAIN:  Are you having pain? Yes: NPRS scale: current 7/10; Pain location: generalized Pain description: N&T arms; right foot numbness, mid back to neck sharp Aggravating factors: walking, standing, showering, toileting Relieving factors: nothing right now; meds; ice packs  PRECAUTIONS: Cervical and Back  WEIGHT BEARING RESTRICTIONS: No  FALLS:  Has patient fallen in last 6 months? No  LIVING ENVIRONMENT: Lives with: lives with their family Lives in: House/apartment Stairs: No Has following equipment at home: Single point cane  OCCUPATION: disabled  PLOF: Needs assistance with ADLs  PATIENT GOALS: decrease pain, improve QOL, walk through grocery store  NEXT MD VISIT:   OBJECTIVE:     PATIENT SURVEYS:  FOTO Primary score 28% with goal of 34% FOTO 03/25/23: 38%  SCREENING FOR RED FLAGS: No  COGNITION: Overall cognitive status: Within functional limits for tasks assessed     SENSATION: P&N throughout ue to hands and le through feet  MUSCLE LENGTH: Hamstrings: full knee extension in sitting   POSTURE: rounded, elevated shoulders, forward head, and flexed trunk   PALPATION: Ms tightness and TTP throughout traps, post cervical spine, erector sp through lumbar spine   Cervical ROM: extension -10d; flex 40d; rotation R/L ~20d 04/27/23:  cervical rotation:  L 38, R 31;  Lateral flexion L 15, R 20   LUMBAR ROM:   AROM eval  04/23/23  Flexion 25% full  Extension -20d -5d  Right lateral flexion 25%   Left lateral flexion 25%   Right rotation  ~20d  Left rotation  ~20d   (Blank rows = not tested)  LOWER Extremity  WFL  LOWER EXTREMITY MMT:      Strength testing limited by pain MMT Right eval Left eval Right / Left 04/23/23  Hip flexion 3 3 3+/5  Hip extension     Hip abduction 3 3 4/5  Hip adduction     Hip internal rotation     Hip external rotation     Knee flexion     Knee extension 3 3 4/5  Ankle dorsiflexion     Ankle plantarflexion     Ankle inversion     Ankle eversion      (Blank rows = not tested)    FUNCTIONAL TESTS:  5 times sit to stand: unable to tolerate Timed up and go (TUG): 21.61 Berg Balance Scale: 22/56  04/02/23:  TUG 20.72s  04/23/23: Sharlene Motts: 32/56   5 x STS:26.24 with  GAIT: Distance walked: 200 Assistive device utilized: None Level of assistance: Modified independence Comments: Forward flex positioning, short step length, limited hip/knee flex with heel strike, slowed cadence, guarded  TODAY'S TREATMENT:                                                                                                                              Pt seen for aquatic therapy today.  Treatment took place in water 3.5-4.75 ft in depth at the [facility]  pool. Temp of water was 91.  Pt entered/exited the pool via stairs in step-to pattern with bilat rail.  *unsupported with arm swing:  walking forward and backward  in 4+ ft  Cues for more upright trunk and neutral head  *side stepping x 1 lap, progressed to rainbow hand floats with addct x 2 laps *staggered stance with bilat shoulder horiz abdct x 5 each for pec stretch * marching with alternating gentle row holding rainbow hand floats - forward /backwards  * straight arm to knee tap (in place) x 5 each leg ( increased low back pain) -> rest * straddling yellow noodle and UE on wall-> yellow hand floats: cycling forward x 2 laps;  suspended jumping jack LEs x 12; cross country ski LEs x 10; return to gentle cycling forward *alternating bow and arrow rainbow hand floats,x 5 each side  PATIENT EDUCATION:  Education details: posture awareness, aquatic therapy exercise modifications Person educated: Patient Education method: Explanation Education comprehension: verbalized understanding  HOME EXERCISE PROGRAM: Access Code: [email or web address] URL: [email or web address] Date: 03/16/2023 Prepared by: Geni Bers  Exercises - Seated Cervical Rotation AROM  - 1 x daily - 7 x weekly - 3 sets - 10 reps - Seated Cervical Extension AROM  - 1 x daily - 7 x weekly - 3 sets - 10 reps - Seated Passive Cervical Retraction  - 1 x daily - 7 x weekly - 3 sets - 10 reps - Seated Scapular Retraction  - 1 x daily - 7 x weekly - 3 sets - 10 reps  ASSESSMENT:  CLINICAL IMPRESSION: Pt able to balance well on noodle in deeper water with legs; tolerated cycling well without any LOB.  Pain reduced to 5/10 while in the water.  Posture and head position improving gradually each visit.  Gradually progressing towards goals.  She will continue to benefit from skilled physical therapy intervention.  Will add land based intervention for increased load progressing strength LE and core, address cervical ROM, instruction on stair climbing and DN as indicated. Will alternate with aquatics for optimal progression towards land based goals.  Awaiting approval from [place] for additional visits; utilizing BCBS for visits after #15.      OBJECTIVE IMPAIRMENTS: Abnormal gait, decreased activity tolerance, decreased balance, decreased endurance, decreased mobility, difficulty walking, decreased ROM, decreased strength, impaired sensation, improper body mechanics, postural dysfunction, obesity, and pain.   ACTIVITY LIMITATIONS: carrying, lifting, bending, sitting, standing, squatting, sleeping, stairs, transfers, bed mobility, bathing, toileting, reach over  head, locomotion level, and caring for others  PARTICIPATION LIMITATIONS: meal prep, cleaning, laundry, driving, shopping, community activity, occupation, and yard work  PERSONAL FACTORS: Fitness, Past/current experiences, and Time since onset of injury/illness/exacerbation are also affecting patient's functional outcome.   REHAB POTENTIAL: Good  CLINICAL DECISION MAKING: Evolving/moderate complexity  EVALUATION COMPLEXITY: Moderate   GOALS: Goals reviewed with patient? Yes  SHORT TERM GOALS: Target date: 04/16/23  Pt will tolerate full aquatic sessions consistently without increase in pain and with improving function to demonstrate good toleration and effectiveness of intervention.   Baseline: Goal status: MET - 04/02/23  2.  Pt will improve on Tug test to <or=  15s to demonstrate improvement in lower extremity function, mobility and decreased fall risk.  Baseline:21.61  Goal status: IN PROGRESS 04/14/23  3.  Pt will obtain DME's as needed to improve safety with ADL's and amb Baseline: needs: rollator; shower chair and grab bars for toilet (has not received rollator from [place] yet) Goal status: In Progress -  04/27/23  4.  Pt will have a decrease in her minimal pain to </= 4/10 Baseline: 8/10 Goal status: In progress (5/10 on 04/14/23)  5.  Pt will report completing showering without assist and apprehensiveness of falling Baseline: afraid of falling/not wanting to ask for help Goal status: Met 04/14/23    LONG TERM GOALS: Target date: 06/18/23  Pt to meet stated Foto Goal of 34% Baseline: 28% Goal status:MET - 03/25/23  2.  Pt will improve strength in all areas listed by  1 grade (MMT, plan to measure using HHD when tolerated)  to demonstrate improved overall physical function  Baseline: see chart Goal status: INITIAL  3.  Pt will improve on Berg balance test to >/=  50/56 to demonstrate a decrease in fall risk. Baseline: 22/56 at eval; (32/56 on 04/23/23) Goal status: In  progress   4.  Pt will tolerate walking to and from setting and engaging in aquatic therapy session without excessive fatigue or increase in pain to demonstrate improved toleration to activity.  Baseline: pain and multiple rest periods Goal status: Met 04/23/23  5.  Pt will be able to tolerate walking through grocery store without increase in pain and without seated rest period to demonstrate improved toleration to activity Baseline: unable (personal goal) Goal status: ongoing 04/23/23  6.  Pt will have an overall decrease in max pain to </= 4/10 for improved QOL Baseline: 8/10 Goal status: In progress 04/23/23  PLAN:  PT FREQUENCY: 1-2x/week  PT DURATION: 8 weeks   PLANNED INTERVENTIONS: Therapeutic exercises, Therapeutic activity, Neuromuscular re-education, Balance training, Gait training, Patient/Family education, Self Care, Joint mobilization, Stair training, Orthotic/Fit training, DME instructions, Aquatic Therapy, Dry Needling, Cryotherapy, Moist heat, Taping, Ionotophoresis 4mg /ml Dexamethasone, Manual therapy, and Re-evaluation.  PLAN FOR NEXT SESSION: aquatic intervention for general strengthening and mobility, stretching/ROM, pain reduction, posture correction, gait cervical through lumbar spine. Land: DN as approp upper traps; core/le strengthening; stair climbing; posture correction  Mayer Camel, PTA 04/29/23 11:09 AM [facility] [address] [place], [place], [address] Phone: [phone]   Fax:  [phone]

## 2023-05-03 ENCOUNTER — Encounter (HOSPITAL_BASED_OUTPATIENT_CLINIC_OR_DEPARTMENT_OTHER): Payer: Self-pay | Admitting: Physical Therapy

## 2023-05-03 ENCOUNTER — Ambulatory Visit (HOSPITAL_BASED_OUTPATIENT_CLINIC_OR_DEPARTMENT_OTHER): Payer: PRIVATE HEALTH INSURANCE | Admitting: Physical Therapy

## 2023-05-03 DIAGNOSIS — M549 Dorsalgia, unspecified: Secondary | ICD-10-CM | POA: Diagnosis not present

## 2023-05-03 DIAGNOSIS — R293 Abnormal posture: Secondary | ICD-10-CM

## 2023-05-03 DIAGNOSIS — M6281 Muscle weakness (generalized): Secondary | ICD-10-CM

## 2023-05-03 NOTE — Therapy (Signed)
OUTPATIENT PHYSICAL THERAPY [facility] TREATMENT  Department of [facility]  [id number] 15 visits approved from 12/18/22-04/17/23 [facility] Contact: Ardelia Mems [phone] Patient Name: Toni Lewis MRN: [id number] DOB:[birth year], 57 y.o., female Today's Date: 05/03/2023  END OF SESSION:  PT End of Session - 05/03/23 1042     Visit Number 17    Number of Visits 28    Date for PT Re-Evaluation 06/18/23    Authorization Type VA (for first 15); [employer] and [employer] (for visit 16-)    PT Start Time 1031    PT Stop Time 1112    PT Time Calculation (min) 41 min    Behavior During Therapy WFL for tasks assessed/performed              Past Medical History:  Diagnosis Date   Anal fissure    Anxiety    Depression    Endometriosis    Headache    Hemorrhoids    Hypertension    Peptic ulcer disease    Past Surgical History:  Procedure Laterality Date   COLONOSCOPY  2022   ENDOMETRIAL ABLATION     LUMBAR LAMINECTOMY/ DECOMPRESSION WITH MET-RX Right 10/30/2021   Procedure: Right Lumbar Three-Four Lumbar Four-Five Laminotomy, Discectomy with metrx;  Surgeon: Barnett Abu, MD;  Location: [facility] OR;  Service: Neurosurgery;  Laterality: Right;   POSTERIOR CERVICAL LAMINECTOMY WITH MET- RX Bilateral 10/30/2021   Procedure: Bilateral Cervical Five-Six Foraminotomy w/Metrx;  Surgeon: Barnett Abu, MD;  Location: [facility] OR;  Service: Neurosurgery;  Laterality: Bilateral;   TONSILLECTOMY     Patient Active Problem List   Diagnosis Date Noted   Cervical disc disorder with radiculopathy of cervical region 10/30/2021   Benign essential HTN 02/01/2014   Chest pain 02/01/2014   Syncope, vasovagal 02/01/2014   Palpitations 02/01/2014    PCP: Greta Doom, MD   REFERRING PROVIDER: Greta Doom, MD   REFERRING DIAG:  M54.9 (ICD-10-CM) - Dorsalgia, unspecified   Rationale for Evaluation and Treatment: Rehabilitation  THERAPY DIAG:  Dorsalgia of multiple sites in  spine  Abnormal posture  Muscle weakness-general  ONSET DATE: >10 yrs  SUBJECTIVE:                                                                                                                                                                                           SUBJECTIVE STATEMENT: Pt reports she had tingling in LEs after exiting the hot tub; reduced with rest after resting at home.  She reports she hasn't attempted grocery shopping again.  She plans to check out the [employer] (both [place] and [place] are = distance from  home).    PERTINENT HISTORY:  10/30/21: Surgeon Barnett Abu     LUMBAR LAMINECTOMY/ DECOMPRESSION       POSTERIOR CERVICAL LAMINECTOMY  PAIN:  Are you having pain? Yes: NPRS scale: current 7/10; Pain location: generalized Pain description: N&T arms; right foot numbness, mid back to neck sharp Aggravating factors: walking, standing, showering, toileting Relieving factors: nothing right now; meds; ice packs  PRECAUTIONS: Cervical and Back  WEIGHT BEARING RESTRICTIONS: No  FALLS:  Has patient fallen in last 6 months? No  LIVING ENVIRONMENT: Lives with: lives with their family Lives in: House/apartment Stairs: No Has following equipment at home: Single point cane  OCCUPATION: disabled  PLOF: Needs assistance with ADLs  PATIENT GOALS: decrease pain, improve QOL, walk through grocery store  NEXT MD VISIT:   OBJECTIVE:     PATIENT SURVEYS:  FOTO Primary score 28% with goal of 34% FOTO 03/25/23: 38%  SCREENING FOR RED FLAGS: No  COGNITION: Overall cognitive status: Within functional limits for tasks assessed     SENSATION: P&N throughout ue to hands and le through feet  MUSCLE LENGTH: Hamstrings: full knee extension in sitting   POSTURE: rounded, elevated shoulders, forward head, and flexed trunk   PALPATION: Ms tightness and TTP throughout traps, post cervical spine, erector sp through lumbar spine   Cervical ROM: extension  -10d; flex 40d; rotation R/L ~20d 04/27/23:  cervical rotation:  L 38, R 31;  Lateral flexion L 15, R 20   LUMBAR ROM:   AROM eval 04/23/23  Flexion 25% full  Extension -20d -5d  Right lateral flexion 25%   Left lateral flexion 25%   Right rotation  ~20d  Left rotation  ~20d   (Blank rows = not tested)  LOWER Extremity  WFL  LOWER EXTREMITY MMT:      Strength testing limited by pain MMT Right eval Left eval Right / Left 04/23/23  Hip flexion 3 3 3+/5  Hip extension     Hip abduction 3 3 4/5  Hip adduction     Hip internal rotation     Hip external rotation     Knee flexion     Knee extension 3 3 4/5  Ankle dorsiflexion     Ankle plantarflexion     Ankle inversion     Ankle eversion      (Blank rows = not tested)    FUNCTIONAL TESTS:  5 times sit to stand: unable to tolerate Timed up and go (TUG): 21.61 Berg Balance Scale: 22/56  04/02/23:  TUG 20.72s  04/23/23: Sharlene Motts: 32/56   5 x STS:26.24 with  GAIT: Distance walked: 200 Assistive device utilized: None Level of assistance: Modified independence Comments: Forward flex positioning, short step length, limited hip/knee flex with heel strike, slowed cadence, guarded  TODAY'S TREATMENT:  Pt seen for aquatic therapy today.  Treatment took place in water 3.5-4.75 ft in depth at the [facility] pool. Temp of water was 91.  Pt entered/exited the pool via stairs in step-to pattern with bilat rail.  *unsupported with arm swing:  walking forward and backward  in 4+ ft  Cues for more upright trunk and neutral head  *side stepping x 1 lap, progressed to rainbow hand floats with addct x 2 laps * marching with bilat gentle row holding rainbow hand floats - forward /backwards  *alternating bow and arrow rainbow hand floats,moving across width of pool x 4 * standard stance with reciprocal arm  swing (long slow motions), then bilat shoulder IR/ER x 10 *back against wall with bilat shoulder horiz abdct x 10 for pec stretch * straddling yellow noodle and UE on wall-> yellow hand floats: cycling forward x 2 laps; suspended jumping jack LEs x 12; cross country ski LEs x 10; return to gentle cycling forward * at rails:  fig 4 stretch x 15s each (challenge attaining);  hamstring stretch with foot on 2nd step x 15s each   PATIENT EDUCATION:  Education details: posture awareness, aquatic therapy exercise modifications Person educated: Patient Education method: Explanation Education comprehension: verbalized understanding  HOME EXERCISE PROGRAM: Access Code: [email or web address] URL: [email or web address] Date: 03/16/2023 Prepared by: Geni Bers  Exercises - Seated Cervical Rotation AROM  - 1 x daily - 7 x weekly - 3 sets - 10 reps - Seated Cervical Extension AROM  - 1 x daily - 7 x weekly - 3 sets - 10 reps - Seated Passive Cervical Retraction  - 1 x daily - 7 x weekly - 3 sets - 10 reps - Seated Scapular Retraction  - 1 x daily - 7 x weekly - 3 sets - 10 reps  ASSESSMENT:  CLINICAL IMPRESSION: Good tolerance to progression of aquatic exercises without increase in pain.  Frequent cues required for relaxing upper traps and returning to more neutral posture.   Pain reduced to slightly less than 5/10 while in the water.  Discussed posture and working on staying in more neutral position vs forward flexed when resting. Pt is gradually progressing towards remaining goals.  Will check TUG /MMT with dynamometer next visit.   She will continue to benefit from skilled physical therapy intervention.  Will add land based intervention for increased load progressing strength LE and core, address cervical ROM, instruction on stair climbing and DN as indicated.   Awaiting approval from [place] for additional visits; utilizing BCBS for visits after #15.      OBJECTIVE IMPAIRMENTS: Abnormal gait,  decreased activity tolerance, decreased balance, decreased endurance, decreased mobility, difficulty walking, decreased ROM, decreased strength, impaired sensation, improper body mechanics, postural dysfunction, obesity, and pain.   ACTIVITY LIMITATIONS: carrying, lifting, bending, sitting, standing, squatting, sleeping, stairs, transfers, bed mobility, bathing, toileting, reach over head, locomotion level, and caring for others  PARTICIPATION LIMITATIONS: meal prep, cleaning, laundry, driving, shopping, community activity, occupation, and yard work  PERSONAL FACTORS: Fitness, Past/current experiences, and Time since onset of injury/illness/exacerbation are also affecting patient's functional outcome.   REHAB POTENTIAL: Good  CLINICAL DECISION MAKING: Evolving/moderate complexity  EVALUATION COMPLEXITY: Moderate   GOALS: Goals reviewed with patient? Yes  SHORT TERM GOALS: Target date: 04/16/23  Pt will tolerate full aquatic sessions consistently without increase in pain and with improving function to demonstrate good toleration and effectiveness of intervention.   Baseline: Goal status: MET - 04/02/23  2.  Pt will improve on  Tug test to <or=  15s to demonstrate improvement in lower extremity function, mobility and decreased fall risk.  Baseline:21.61  Goal status: IN PROGRESS 04/14/23  3.  Pt will obtain DME's as needed to improve safety with ADL's and amb Baseline: needs: rollator; shower chair and grab bars for toilet (has not received rollator from [place] yet) Goal status: In Progress - 04/27/23  4.  Pt will have a decrease in her minimal pain to </= 4/10 Baseline: 8/10 Goal status: In progress (5/10 on 05/03/23)  5.  Pt will report completing showering without assist and apprehensiveness of falling Baseline: afraid of falling/not wanting to ask for help Goal status: Met 04/14/23    LONG TERM GOALS: Target date: 06/18/23  Pt to meet stated Foto Goal of 34% Baseline: 28% Goal  status:MET - 03/25/23  2.  Pt will improve strength in all areas listed by  1 grade (MMT, plan to measure using HHD when tolerated)  to demonstrate improved overall physical function  Baseline: see chart Goal status: INITIAL  3.  Pt will improve on Berg balance test to >/=  50/56 to demonstrate a decrease in fall risk. Baseline: 22/56 at eval; (32/56 on 04/23/23) Goal status: In progress   4.  Pt will tolerate walking to and from setting and engaging in aquatic therapy session without excessive fatigue or increase in pain to demonstrate improved toleration to activity.  Baseline: pain and multiple rest periods Goal status: Met 04/23/23  5.  Pt will be able to tolerate walking through grocery store without increase in pain and without seated rest period to demonstrate improved toleration to activity Baseline: unable (personal goal) Goal status: ongoing 04/23/23  6.  Pt will have an overall decrease in max pain to </= 4/10 for improved QOL Baseline: 8/10 Goal status: In progress 04/23/23  PLAN:  PT FREQUENCY: 1-2x/week  PT DURATION: 8 weeks   PLANNED INTERVENTIONS: Therapeutic exercises, Therapeutic activity, Neuromuscular re-education, Balance training, Gait training, Patient/Family education, Self Care, Joint mobilization, Stair training, Orthotic/Fit training, DME instructions, Aquatic Therapy, Dry Needling, Cryotherapy, Moist heat, Taping, Ionotophoresis 4mg /ml Dexamethasone, Manual therapy, and Re-evaluation.  PLAN FOR NEXT SESSION: aquatic intervention for general strengthening and mobility, stretching/ROM, pain reduction, posture correction, gait cervical through lumbar spine. Land: DN as approp upper traps; core/le strengthening; stair climbing; posture correction  Mayer Camel, PTA 05/03/23 12:22 PM [facility] [address] [place], [place], [address] Phone: [phone]   Fax:  [phone]

## 2023-05-05 ENCOUNTER — Ambulatory Visit (HOSPITAL_BASED_OUTPATIENT_CLINIC_OR_DEPARTMENT_OTHER): Payer: PRIVATE HEALTH INSURANCE | Admitting: Physical Therapy

## 2023-05-05 ENCOUNTER — Encounter (HOSPITAL_BASED_OUTPATIENT_CLINIC_OR_DEPARTMENT_OTHER): Payer: Self-pay | Admitting: Physical Therapy

## 2023-05-05 DIAGNOSIS — M6281 Muscle weakness (generalized): Secondary | ICD-10-CM

## 2023-05-05 DIAGNOSIS — M549 Dorsalgia, unspecified: Secondary | ICD-10-CM | POA: Diagnosis not present

## 2023-05-05 DIAGNOSIS — R293 Abnormal posture: Secondary | ICD-10-CM

## 2023-05-05 NOTE — Therapy (Signed)
OUTPATIENT PHYSICAL THERAPY [facility] TREATMENT  Department of [facility]  [id number] visits approved from 12/18/22-04/17/23 [facility] Contact: Ardelia Mems [phone] Patient Name: Toni Lewis MRN: [id number] DOB:Feb 24, 1966, 57 y.o., female Today's Date: 05/05/2023  END OF SESSION:  PT End of Session - 05/05/23 1109     Visit Number 18    Number of Visits 28    Date for PT Re-Evaluation 06/18/23    Authorization Type VA (for first 15); BCBS and [employer] (for visit 16-)    PT Start Time 1115    PT Stop Time 1200    PT Time Calculation (min) 45 min    Activity Tolerance Patient tolerated treatment well    Behavior During Therapy WFL for tasks assessed/performed              Past Medical History:  Diagnosis Date   Anal fissure    Anxiety    Depression    Endometriosis    Headache    Hemorrhoids    Hypertension    Peptic ulcer disease    Past Surgical History:  Procedure Laterality Date   COLONOSCOPY  2022   ENDOMETRIAL ABLATION     LUMBAR LAMINECTOMY/ DECOMPRESSION WITH MET-RX Right 10/30/2021   Procedure: Right Lumbar Three-Four Lumbar Four-Five Laminotomy, Discectomy with metrx;  Surgeon: Barnett Abu, MD;  Location: [facility] OR;  Service: Neurosurgery;  Laterality: Right;   POSTERIOR CERVICAL LAMINECTOMY WITH MET- RX Bilateral 10/30/2021   Procedure: Bilateral Cervical Five-Six Foraminotomy w/Metrx;  Surgeon: Barnett Abu, MD;  Location: [facility] OR;  Service: Neurosurgery;  Laterality: Bilateral;   TONSILLECTOMY     Patient Active Problem List   Diagnosis Date Noted   Cervical disc disorder with radiculopathy of cervical region 10/30/2021   Benign essential HTN 02/01/2014   Chest pain 02/01/2014   Syncope, vasovagal 02/01/2014   Palpitations 02/01/2014    PCP: Greta Doom, MD   REFERRING PROVIDER: Greta Doom, MD   REFERRING DIAG:  M54.9 (ICD-10-CM) - Dorsalgia, unspecified   Rationale for Evaluation and Treatment:  Rehabilitation  THERAPY DIAG:  Dorsalgia of multiple sites in spine  Abnormal posture  Muscle weakness-general  ONSET DATE: >10 yrs  SUBJECTIVE:                                                                                                                                                                                           SUBJECTIVE STATEMENT: Pt reports wearing back brace here walking straighter but increased right le pain.  No tingling after last session with instruction on relaxed shoulder/thoracic area which she has tried to continue with at home. Pain 5/10 waking  increased now to 7/10  PERTINENT HISTORY:  10/30/21: Surgeon Barnett Abu     LUMBAR LAMINECTOMY/ DECOMPRESSION       POSTERIOR CERVICAL LAMINECTOMY  PAIN:  Are you having pain? Yes: NPRS scale: current 7/10; Pain location: generalized Pain description: N&T arms; right foot numbness, mid back to neck sharp Aggravating factors: walking, standing, showering, toileting Relieving factors: nothing right now; meds; ice packs  PRECAUTIONS: Cervical and Back  WEIGHT BEARING RESTRICTIONS: No  FALLS:  Has patient fallen in last 6 months? No  LIVING ENVIRONMENT: Lives with: lives with their family Lives in: House/apartment Stairs: No Has following equipment at home: Single point cane  OCCUPATION: disabled  PLOF: Needs assistance with ADLs  PATIENT GOALS: decrease pain, improve QOL, walk through grocery store  NEXT MD VISIT:   OBJECTIVE:     PATIENT SURVEYS:  FOTO Primary score 28% with goal of 34% FOTO 03/25/23: 38%  SCREENING FOR RED FLAGS: No  COGNITION: Overall cognitive status: Within functional limits for tasks assessed     SENSATION: P&N throughout ue to hands and le through feet  MUSCLE LENGTH: Hamstrings: full knee extension in sitting   POSTURE: rounded, elevated shoulders, forward head, and flexed trunk   PALPATION: Ms tightness and TTP throughout traps, post cervical spine,  erector sp through lumbar spine   Cervical ROM: extension -10d; flex 40d; rotation R/L ~20d 04/27/23:  cervical rotation:  L 38, R 31;  Lateral flexion L 15, R 20   LUMBAR ROM:   AROM eval 04/23/23  Flexion 25% full  Extension -20d -5d  Right lateral flexion 25%   Left lateral flexion 25%   Right rotation  ~20d  Left rotation  ~20d   (Blank rows = not tested)  LOWER Extremity  [facility]  LOWER EXTREMITY MMT:      Strength testing limited by pain MMT Right eval Left eval Right / Left 04/23/23 Right / Left 05/05/23  Hip flexion 3 3 3+/5 3+ -  4  Hip extension      Hip abduction 3 3 4/5 4/5  Hip adduction      Hip internal rotation      Hip external rotation      Knee flexion      Knee extension 3 3 4/5 4+/5  Ankle dorsiflexion      Ankle plantarflexion      Ankle inversion      Ankle eversion       (Blank rows = not tested)    FUNCTIONAL TESTS:  5 times sit to stand: unable to tolerate Timed up and go (TUG): 21.61 Berg Balance Scale: 22/56  04/02/23:  TUG 20.72s  04/23/23: Berg: 32/56   5 x STS:26.24 with     05/05/23: TUG:19.41 GAIT: Distance walked: 200 Assistive device utilized: None Level of assistance: Modified independence Comments: Forward flex positioning, short step length, limited hip/knee flex with heel strike, slowed cadence, guarded  TODAY'S TREATMENT:  Pt seen for aquatic therapy today.  Treatment took place in water 3.5-4.75 ft in depth at the [facility] pool. Temp of water was 91.  Pt entered/exited the pool via stairs in step-to pattern with bilat rail.  *unsupported with arm swing:  walking forward and backward  in 4+ ft  Cues for more upright trunk and neutral head  *side stepping rainbow hand floats with addct x 4 laps.  *alternating bow and arrow rainbow hand floats,moving across width of pool backward x 4 *  standard stance with reciprocal arm swing (long slow motions) using slight resistance with green hand bells *shoulder circles forward x 10 then back x 10 * marching with bilat gentle row holding rainbow hand floats - forward /backwards  *back just slightly off of wall with bilat shoulder horiz abdct x 15-20 for pec stretch * straddling yellow noodle UE on white barbell: cycling forward x 6 laps; suspended jumping jack LEs x 30; cross country ski LEs x 30; return to gentle cycling forward   PATIENT EDUCATION:  Education details: posture awareness, aquatic therapy exercise modifications Person educated: Patient Education method: Explanation Education comprehension: verbalized understanding  HOME EXERCISE PROGRAM: Access Code: [email or web address] URL: [email or web address] Date: 03/16/2023 Prepared by: Geni Bers  Exercises - Seated Cervical Rotation AROM  - 1 x daily - 7 x weekly - 3 sets - 10 reps - Seated Cervical Extension AROM  - 1 x daily - 7 x weekly - 3 sets - 10 reps - Seated Passive Cervical Retraction  - 1 x daily - 7 x weekly - 3 sets - 10 reps - Seated Scapular Retraction  - 1 x daily - 7 x weekly - 3 sets - 10 reps  ASSESSMENT:  CLINICAL IMPRESSION: Pt reports reduction in tingling in feet after last session likely due to position modification, relaxation with carry over at home, of upper traps maintaining a neutral position. She is given frequent cues to maintain position throughout session today.  Improved sitting balance on noodle using barbell.  Tolerates increased reps and progressed resistance with exercises today well.  MMT demonstrates overall improvement in strength with RLE hip strength deficit >L limited by pain. TUG score is unchanged. She reports reduction in right hip pain post session. Going to [place] tomorrow to get DME's.   Awaiting approval from [place] for additional visits; utilizing BCBS for visits after #15.      OBJECTIVE IMPAIRMENTS: Abnormal gait,  decreased activity tolerance, decreased balance, decreased endurance, decreased mobility, difficulty walking, decreased ROM, decreased strength, impaired sensation, improper body mechanics, postural dysfunction, obesity, and pain.   ACTIVITY LIMITATIONS: carrying, lifting, bending, sitting, standing, squatting, sleeping, stairs, transfers, bed mobility, bathing, toileting, reach over head, locomotion level, and caring for others  PARTICIPATION LIMITATIONS: meal prep, cleaning, laundry, driving, shopping, community activity, occupation, and yard work  PERSONAL FACTORS: Fitness, Past/current experiences, and Time since onset of injury/illness/exacerbation are also affecting patient's functional outcome.   REHAB POTENTIAL: Good  CLINICAL DECISION MAKING: Evolving/moderate complexity  EVALUATION COMPLEXITY: Moderate   GOALS: Goals reviewed with patient? Yes  SHORT TERM GOALS: Target date: 04/16/23  Pt will tolerate full aquatic sessions consistently without increase in pain and with improving function to demonstrate good toleration and effectiveness of intervention.   Baseline: Goal status: MET - 04/02/23  2.  Pt will improve on Tug test to <or=  15s to demonstrate improvement in lower extremity function, mobility and decreased fall risk.  Baseline:21.61  Goal status: IN PROGRESS 04/14/23; 19.41 05/05/23 Ongoing  3.  Pt will obtain DME's as needed to improve safety with ADL's and amb Baseline: needs: rollator; shower chair and grab bars for toilet (has not received rollator from [place] yet) Goal status: In Progress - 04/27/23  4.  Pt will have a decrease in her minimal pain to </= 4/10 Baseline: 8/10 Goal status: In progress (5/10 on 05/03/23)  5.  Pt will report completing showering without assist and apprehensiveness of falling Baseline: afraid of falling/not wanting to ask for help Goal status: Met 04/14/23    LONG TERM GOALS: Target date: 06/18/23  Pt to meet stated Foto Goal of  34% Baseline: 28% Goal status:MET - 03/25/23  2.  Pt will improve strength in all areas listed by  1 grade (MMT, plan to measure using HHD when tolerated)  to demonstrate improved overall physical function  Baseline: see chart Goal status: In Progress 05/05/23  3.  Pt will improve on Berg balance test to >/=  50/56 to demonstrate a decrease in fall risk. Baseline: 22/56 at eval; (32/56 on 04/23/23) Goal status: In progress   4.  Pt will tolerate walking to and from setting and engaging in aquatic therapy session without excessive fatigue or increase in pain to demonstrate improved toleration to activity.  Baseline: pain and multiple rest periods Goal status: Met 04/23/23  5.  Pt will be able to tolerate walking through grocery store without increase in pain and without seated rest period to demonstrate improved toleration to activity Baseline: unable (personal goal) Goal status: ongoing 04/23/23  6.  Pt will have an overall decrease in max pain to </= 4/10 for improved QOL Baseline: 8/10 Goal status: In progress 04/23/23  PLAN:  PT FREQUENCY: 1-2x/week  PT DURATION: 8 weeks   PLANNED INTERVENTIONS: Therapeutic exercises, Therapeutic activity, Neuromuscular re-education, Balance training, Gait training, Patient/Family education, Self Care, Joint mobilization, Stair training, Orthotic/Fit training, DME instructions, Aquatic Therapy, Dry Needling, Cryotherapy, Moist heat, Taping, Ionotophoresis 4mg /ml Dexamethasone, Manual therapy, and Re-evaluation.  PLAN FOR NEXT SESSION: aquatic intervention for general strengthening and mobility, stretching/ROM, pain reduction, posture correction, gait cervical through lumbar spine. Land: DN as approp upper traps; core/le strengthening; stair climbing; posture correction  Rushie Chestnut) Athleen Feltner MPT 05/05/23 11:10 AM [facility] [address] [place], [place], [address] Phone: [phone]   Fax:   [phone]

## 2023-05-10 ENCOUNTER — Ambulatory Visit (HOSPITAL_BASED_OUTPATIENT_CLINIC_OR_DEPARTMENT_OTHER): Payer: PRIVATE HEALTH INSURANCE | Admitting: Physical Therapy

## 2023-05-10 ENCOUNTER — Encounter (HOSPITAL_BASED_OUTPATIENT_CLINIC_OR_DEPARTMENT_OTHER): Payer: Self-pay | Admitting: Physical Therapy

## 2023-05-10 DIAGNOSIS — M549 Dorsalgia, unspecified: Secondary | ICD-10-CM

## 2023-05-10 DIAGNOSIS — M6281 Muscle weakness (generalized): Secondary | ICD-10-CM

## 2023-05-10 DIAGNOSIS — R293 Abnormal posture: Secondary | ICD-10-CM

## 2023-05-10 NOTE — Therapy (Signed)
OUTPATIENT PHYSICAL THERAPY [facility] TREATMENT  Department of [facility]  [id number] visits approved from 12/18/22-04/17/23 [facility] Contact: Ardelia Mems [phone] Patient Name: Toni Lewis MRN: [id number] DOB:06-May-1966, 57 y.o., female Today's Date: 05/10/2023  END OF SESSION:  PT End of Session - 05/10/23 1216     Visit Number 19    Number of Visits 28    Date for PT Re-Evaluation 06/18/23    Authorization Type VA (for first 15); BCBS and [employer] (for visit 16 and beyond)    PT Start Time 1203    PT Stop Time 1243    PT Time Calculation (min) 40 min    Activity Tolerance Patient tolerated treatment well    Behavior During Therapy [facility] for tasks assessed/performed              Past Medical History:  Diagnosis Date   Anal fissure    Anxiety    Depression    Endometriosis    Headache    Hemorrhoids    Hypertension    Peptic ulcer disease    Past Surgical History:  Procedure Laterality Date   COLONOSCOPY  2022   ENDOMETRIAL ABLATION     LUMBAR LAMINECTOMY/ DECOMPRESSION WITH MET-RX Right 10/30/2021   Procedure: Right Lumbar Three-Four Lumbar Four-Five Laminotomy, Discectomy with metrx;  Surgeon: Barnett Abu, MD;  Location: [facility] OR;  Service: Neurosurgery;  Laterality: Right;   POSTERIOR CERVICAL LAMINECTOMY WITH MET- RX Bilateral 10/30/2021   Procedure: Bilateral Cervical Five-Six Foraminotomy w/Metrx;  Surgeon: Barnett Abu, MD;  Location: [facility] OR;  Service: Neurosurgery;  Laterality: Bilateral;   TONSILLECTOMY     Patient Active Problem List   Diagnosis Date Noted   Cervical disc disorder with radiculopathy of cervical region 10/30/2021   Benign essential HTN 02/01/2014   Chest pain 02/01/2014   Syncope, vasovagal 02/01/2014   Palpitations 02/01/2014    PCP: Greta Doom, MD   REFERRING PROVIDER: Greta Doom, MD   REFERRING DIAG:  M54.9 (ICD-10-CM) - Dorsalgia, unspecified   Rationale for Evaluation and Treatment:  Rehabilitation  THERAPY DIAG:  Dorsalgia of multiple sites in spine  Abnormal posture  Muscle weakness-general  ONSET DATE: >10 yrs  SUBJECTIVE:                                                                                                                                                                                           SUBJECTIVE STATEMENT: Pt reports she received her rollator on Thursday; only uses it in community and holds on to walls when at home.  She went to [employer] and pushed a cart for 15 min.  PERTINENT HISTORY:  10/30/21: Surgeon Barnett Abu     LUMBAR LAMINECTOMY/ DECOMPRESSION       POSTERIOR CERVICAL LAMINECTOMY  PAIN:  Are you having pain? Yes: NPRS scale: current 7/10; Pain location: generalized Pain description: N&T arms; right foot numbness, mid back to neck sharp Aggravating factors: walking, standing, showering, toileting Relieving factors: nothing right now; meds; ice packs  PRECAUTIONS: Cervical and Back  WEIGHT BEARING RESTRICTIONS: No  FALLS:  Has patient fallen in last 6 months? No  LIVING ENVIRONMENT: Lives with: lives with their family Lives in: House/apartment Stairs: No Has following equipment at home: Single point cane  OCCUPATION: disabled  PLOF: Needs assistance with ADLs  PATIENT GOALS: decrease pain, improve QOL, walk through grocery store  NEXT MD VISIT:   OBJECTIVE:     PATIENT SURVEYS:  FOTO Primary score 28% with goal of 34% FOTO 03/25/23: 38%   SCREENING FOR RED FLAGS: No  COGNITION: Overall cognitive status: Within functional limits for tasks assessed     SENSATION: P&N throughout ue to hands and le through feet  MUSCLE LENGTH: Hamstrings: full knee extension in sitting   POSTURE: rounded, elevated shoulders, forward head, and flexed trunk   PALPATION: Ms tightness and TTP throughout traps, post cervical spine, erector sp through lumbar spine   Cervical ROM: extension -10d; flex 40d; rotation R/L  ~20d 04/27/23:  cervical rotation:  L 38, R 31;  Lateral flexion L 15, R 20   LUMBAR ROM:   AROM eval 04/23/23  Flexion 25% full  Extension -20d -5d  Right lateral flexion 25%   Left lateral flexion 25%   Right rotation  ~20d  Left rotation  ~20d   (Blank rows = not tested)  LOWER Extremity  [facility]  LOWER EXTREMITY MMT:      Strength testing limited by pain MMT Right eval Left eval Right / Left 04/23/23 Right / Left 05/05/23  Hip flexion 3 3 3+/5 3+ -  4  Hip extension      Hip abduction 3 3 4/5 4/5  Hip adduction      Hip internal rotation      Hip external rotation      Knee flexion      Knee extension 3 3 4/5 4+/5  Ankle dorsiflexion      Ankle plantarflexion      Ankle inversion      Ankle eversion       (Blank rows = not tested)    FUNCTIONAL TESTS:  5 times sit to stand: unable to tolerate Timed up and go (TUG): 21.61 Berg Balance Scale: 22/56  04/02/23:  TUG 20.72s  04/23/23: Berg: 32/56   5 x STS:26.24 with     05/05/23: TUG:19.41 GAIT: Distance walked: 200 Assistive device utilized: None Level of assistance: Modified independence Comments: Forward flex positioning, short step length, limited hip/knee flex with heel strike, slowed cadence, guarded  TODAY'S TREATMENT:  Pt seen for aquatic therapy today.  Treatment took place in water 3.5-4.75 ft in depth at the [facility] pool. Temp of water was 91.  Pt entered/exited the pool via stairs in step-to pattern with bilat rail.  *unsupported with arm swing:  walking forward and backward  in 4+ ft   *side stepping rainbow ->yellow hand floats with addct x 4 laps.  *alternating bow and arrow yellow hand floats,moving across width of pool backward x 4 * tricep push downs with yellow hand floats x 10; staggered stance with bilat horiz abdct with yellow hand floats 2sets of 5 *  holding wall:  hip abdct 2 sets 5;  leg swings into hip flex/ext x 10 each * wood chop with small yellow ball x 5 each direction ( swiveling in feet instead of trunk rotation) * warrior 1 with back heel lift with yellow ball  x 5 each LE * wall push up/off x 5;    standing thoracic ext with scap squeeze x 3 sec x 3 reps * straddling yellow noodle UE on white barbell: cycling forward   PATIENT EDUCATION:  Education details: posture awareness, aquatic therapy exercise modifications Person educated: Patient Education method: Explanation Education comprehension: verbalized understanding  HOME EXERCISE PROGRAM: Access Code: [email or web address] URL: [email or web address] Date: 03/16/2023 Prepared by: Geni Bers  Exercises - Seated Cervical Rotation AROM  - 1 x daily - 7 x weekly - 3 sets - 10 reps - Seated Cervical Extension AROM  - 1 x daily - 7 x weekly - 3 sets - 10 reps - Seated Passive Cervical Retraction  - 1 x daily - 7 x weekly - 3 sets - 10 reps - Seated Scapular Retraction  - 1 x daily - 7 x weekly - 3 sets - 10 reps  ASSESSMENT:  CLINICAL IMPRESSION: Pt tolerated increased reps and progressed resistance with exercises today well.  She required less rest breaks than previous sessions. She reports reduction in Left hip pain post session.Discussed being mindful of posture at home (ie: prolonged long sitting in bed like c-shape) and trials of more upright posture in chair with back support.  She reported reduction of pain to 4/10 at end of session.  Pt has met STG 3.    Awaiting approval from [place] for additional visits; utilizing BCBS for visits after #15.      OBJECTIVE IMPAIRMENTS: Abnormal gait, decreased activity tolerance, decreased balance, decreased endurance, decreased mobility, difficulty walking, decreased ROM, decreased strength, impaired sensation, improper body mechanics, postural dysfunction, obesity, and pain.   ACTIVITY LIMITATIONS: carrying, lifting,  bending, sitting, standing, squatting, sleeping, stairs, transfers, bed mobility, bathing, toileting, reach over head, locomotion level, and caring for others  PARTICIPATION LIMITATIONS: meal prep, cleaning, laundry, driving, shopping, community activity, occupation, and yard work  PERSONAL FACTORS: Fitness, Past/current experiences, and Time since onset of injury/illness/exacerbation are also affecting patient's functional outcome.   REHAB POTENTIAL: Good  CLINICAL DECISION MAKING: Evolving/moderate complexity  EVALUATION COMPLEXITY: Moderate   GOALS: Goals reviewed with patient? Yes  SHORT TERM GOALS: Target date: 04/16/23  Pt will tolerate full aquatic sessions consistently without increase in pain and with improving function to demonstrate good toleration and effectiveness of intervention.   Baseline: Goal status: MET - 04/02/23  2.  Pt will improve on Tug test to <or=  15s to demonstrate improvement in lower extremity function, mobility and decreased fall risk.  Baseline:21.61  Goal status: IN PROGRESS 04/14/23; 19.41 05/05/23 Ongoing  3.  Pt will obtain DME's as  needed to improve safety with ADL's and amb Baseline: received shower chair, reacher, and rollator  Goal status: met-  05/10/23  4.  Pt will have a decrease in her minimal pain to </= 4/10 Baseline: 8/10 Goal status: In progress (5/10 on 05/03/23)  5.  Pt will report completing showering without assist and apprehensiveness of falling Baseline: afraid of falling/not wanting to ask for help Goal status: Met 04/14/23    LONG TERM GOALS: Target date: 06/18/23  Pt to meet stated Foto Goal of 34% Baseline: 28% Goal status:MET - 03/25/23  2.  Pt will improve strength in all areas listed by  1 grade (MMT, plan to measure using HHD when tolerated)  to demonstrate improved overall physical function  Baseline: see chart Goal status: In Progress 05/05/23  3.  Pt will improve on Berg balance test to >/=  50/56 to demonstrate a  decrease in fall risk. Baseline: 22/56 at eval; (32/56 on 04/23/23) Goal status: In progress   4.  Pt will tolerate walking to and from setting and engaging in aquatic therapy session without excessive fatigue or increase in pain to demonstrate improved toleration to activity.  Baseline: pain and multiple rest periods Goal status: Met 04/23/23  5.  Pt will be able to tolerate walking through grocery store without increase in pain and without seated rest period to demonstrate improved toleration to activity Baseline: unable (personal goal) Goal status: ongoing 04/23/23  6.  Pt will have an overall decrease in max pain to </= 4/10 for improved QOL Baseline: 8/10 Goal status: In progress 04/23/23  PLAN:  PT FREQUENCY: 1-2x/week  PT DURATION: 8 weeks   PLANNED INTERVENTIONS: Therapeutic exercises, Therapeutic activity, Neuromuscular re-education, Balance training, Gait training, Patient/Family education, Self Care, Joint mobilization, Stair training, Orthotic/Fit training, DME instructions, Aquatic Therapy, Dry Needling, Cryotherapy, Moist heat, Taping, Ionotophoresis 4mg /ml Dexamethasone, Manual therapy, and Re-evaluation.  PLAN FOR NEXT SESSION: aquatic intervention for general strengthening and mobility, stretching/ROM, pain reduction, posture correction, gait cervical through lumbar spine. Land: DN as approp upper traps; core/le strengthening; stair climbing; posture correction  Mayer Camel, PTA 05/10/23 12:55 PM [facility] [address] [place], [place], [address] Phone: [phone]   Fax:  [phone]

## 2023-05-11 IMAGING — CR DG CHEST 2V
2 series · 2 of 2 positions shown · non-contrast
Comparison: 10/20/2018

CLINICAL DATA: Cough

EXAM:
CHEST - 2 VIEW

[w chest pa]
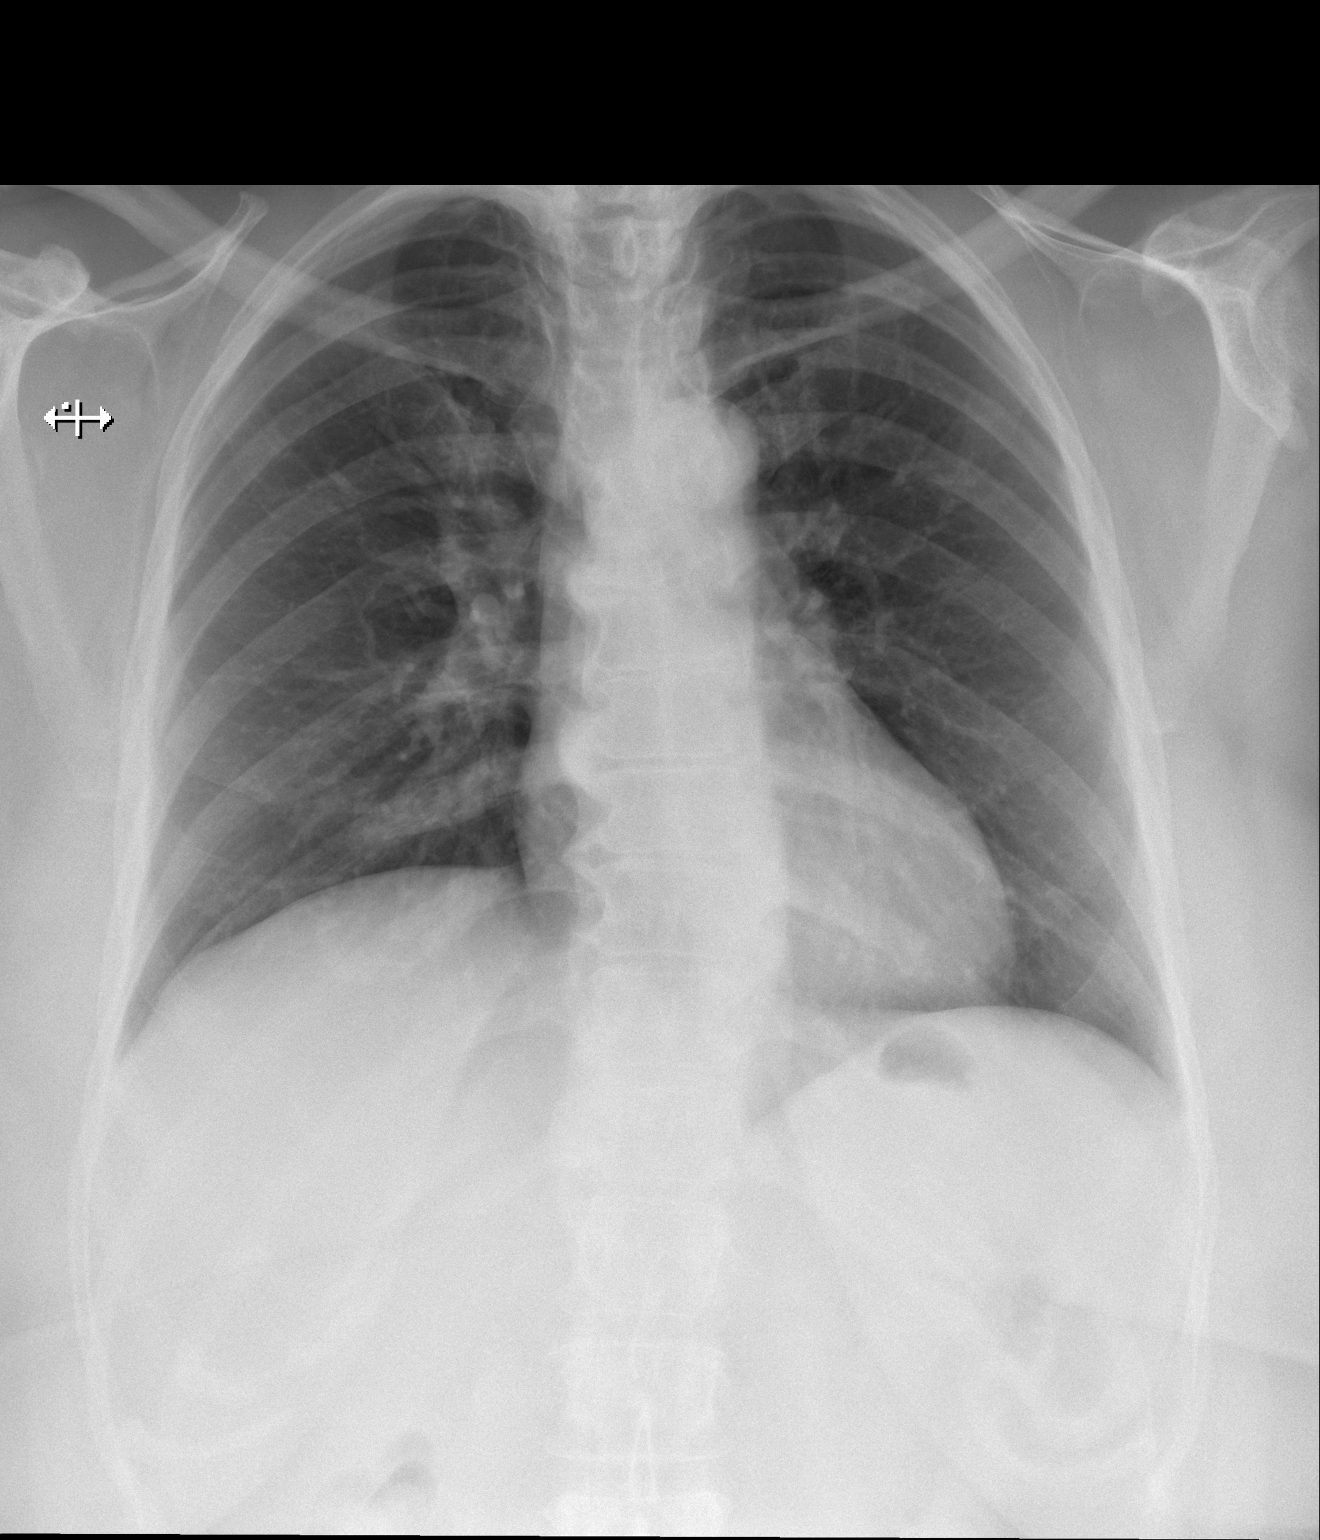

[w chest lat]
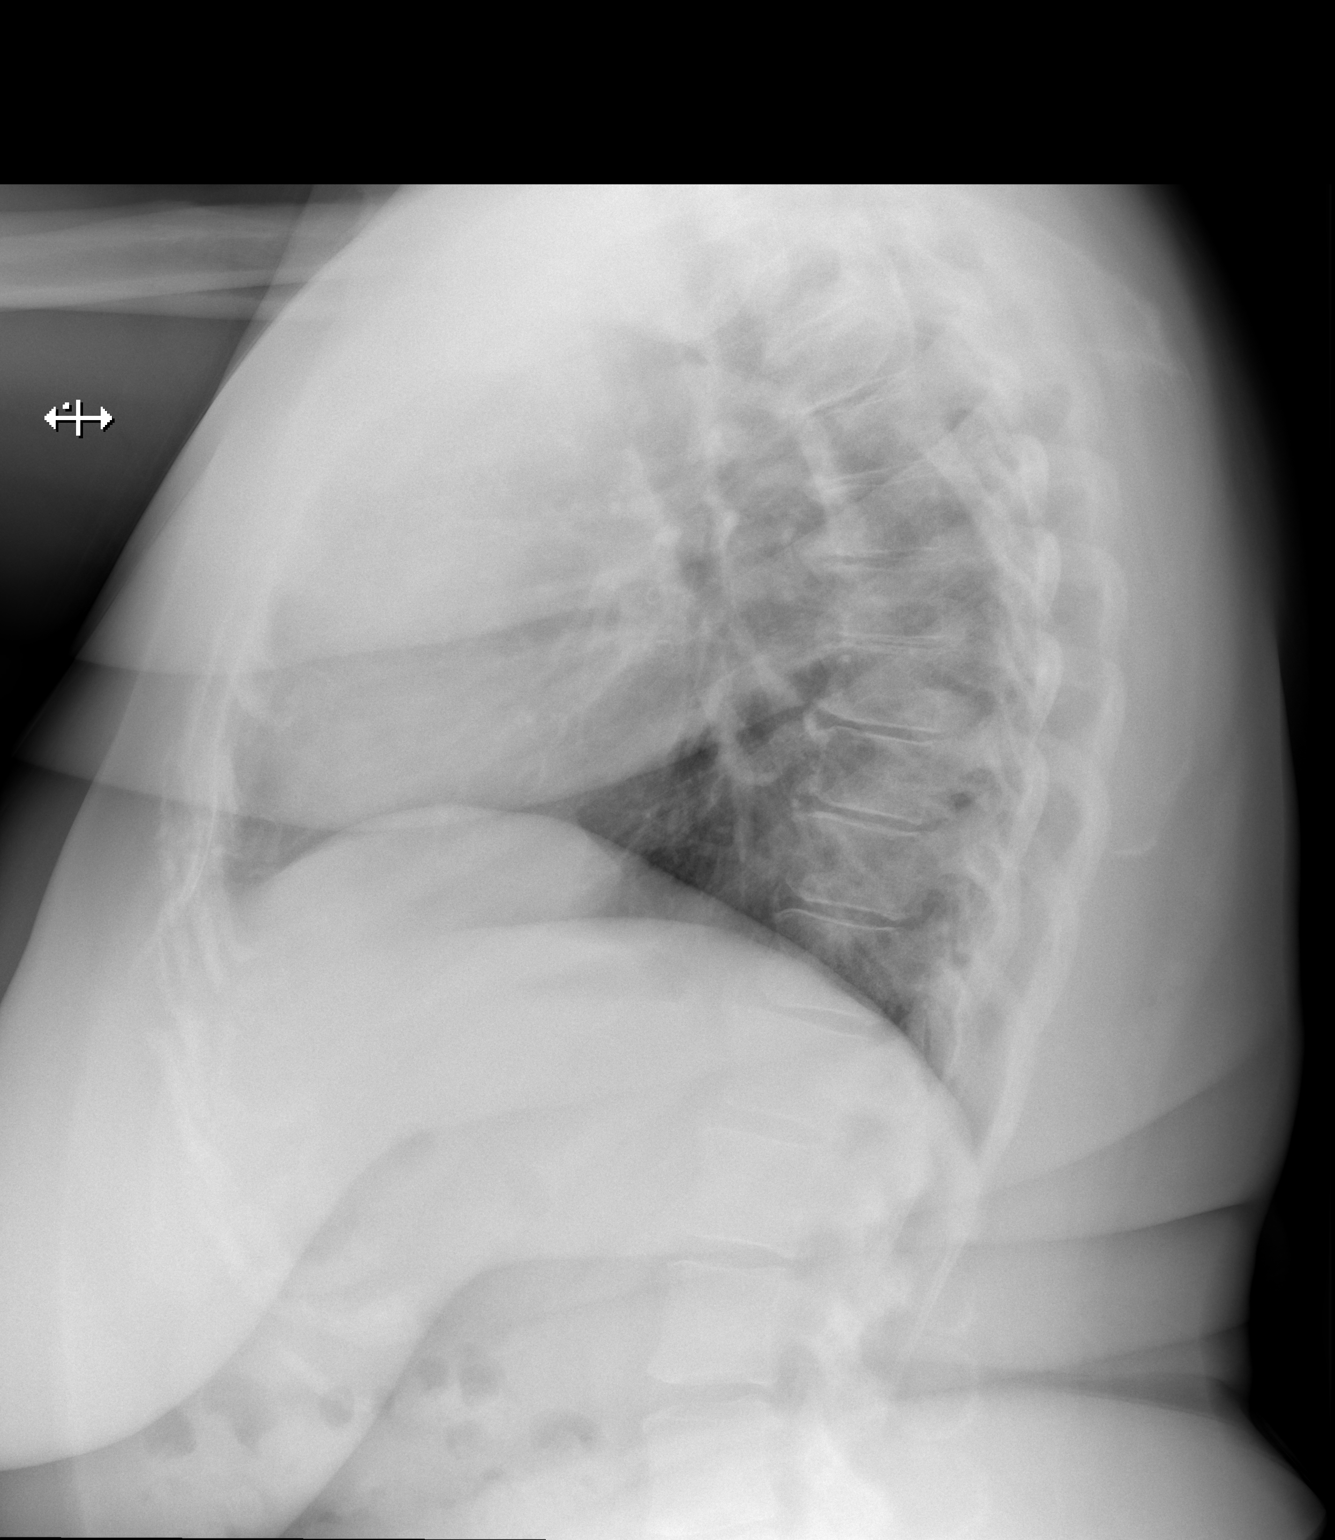

[2 of 2 positions shown; findings below may reference images not displayed]

FINDINGS: The heart size and mediastinal contours are within normal limits.
Both lungs are clear. Disc degenerative disease of the thoracic
spine.
IMPRESSION: No acute abnormality of the lungs.

## 2023-05-12 ENCOUNTER — Ambulatory Visit (HOSPITAL_BASED_OUTPATIENT_CLINIC_OR_DEPARTMENT_OTHER): Payer: BC Managed Care – PPO | Admitting: Physical Therapy

## 2023-05-14 ENCOUNTER — Ambulatory Visit (HOSPITAL_BASED_OUTPATIENT_CLINIC_OR_DEPARTMENT_OTHER): Payer: PRIVATE HEALTH INSURANCE | Admitting: Physical Therapy

## 2023-05-14 ENCOUNTER — Encounter (HOSPITAL_BASED_OUTPATIENT_CLINIC_OR_DEPARTMENT_OTHER): Payer: Self-pay | Admitting: Physical Therapy

## 2023-05-14 DIAGNOSIS — M549 Dorsalgia, unspecified: Secondary | ICD-10-CM

## 2023-05-14 DIAGNOSIS — M6281 Muscle weakness (generalized): Secondary | ICD-10-CM

## 2023-05-14 DIAGNOSIS — R293 Abnormal posture: Secondary | ICD-10-CM

## 2023-05-14 NOTE — Therapy (Addendum)
OUTPATIENT PHYSICAL THERAPY [facility] TREATMENT  Department of [facility]  [id number] 15 visits approved from 12/18/22-04/17/23 [facility] Contact: Ardelia Mems [phone] Patient Name: Toni Lewis MRN: [id number] DOB:14-Aug-1966, 57 y.o., female Today's Date: 05/14/2023  END OF SESSION:  PT End of Session - 05/14/23 1030     Visit Number 20    Number of Visits 28    Date for PT Re-Evaluation 06/18/23    Authorization Type VA (for first 15); BCBS and [employer] (for visit 16 and beyond)    PT Start Time 1027    PT Stop Time 1110    PT Time Calculation (min) 43 min              Past Medical History:  Diagnosis Date   Anal fissure    Anxiety    Depression    Endometriosis    Headache    Hemorrhoids    Hypertension    Peptic ulcer disease    Past Surgical History:  Procedure Laterality Date   COLONOSCOPY  2022   ENDOMETRIAL ABLATION     LUMBAR LAMINECTOMY/ DECOMPRESSION WITH MET-RX Right 10/30/2021   Procedure: Right Lumbar Three-Four Lumbar Four-Five Laminotomy, Discectomy with metrx;  Surgeon: Barnett Abu, MD;  Location: [facility] OR;  Service: Neurosurgery;  Laterality: Right;   POSTERIOR CERVICAL LAMINECTOMY WITH MET- RX Bilateral 10/30/2021   Procedure: Bilateral Cervical Five-Six Foraminotomy w/Metrx;  Surgeon: Barnett Abu, MD;  Location: [facility] OR;  Service: Neurosurgery;  Laterality: Bilateral;   TONSILLECTOMY     Patient Active Problem List   Diagnosis Date Noted   Cervical disc disorder with radiculopathy of cervical region 10/30/2021   Benign essential HTN 02/01/2014   Chest pain 02/01/2014   Syncope, vasovagal 02/01/2014   Palpitations 02/01/2014    PCP: Greta Doom, MD   REFERRING PROVIDER: Greta Doom, MD   REFERRING DIAG:  M54.9 (ICD-10-CM) - Dorsalgia, unspecified   Rationale for Evaluation and Treatment: Rehabilitation  THERAPY DIAG:  Dorsalgia of multiple sites in spine  Abnormal posture  Muscle weakness-general  ONSET  DATE: >10 yrs  SUBJECTIVE:                                                                                                                                                                                           SUBJECTIVE STATEMENT: Pt reports she held her grandson yesterday (18#); she needed help from her mom to help babysit him.    PERTINENT HISTORY:  10/30/21: Surgeon Barnett Abu     LUMBAR LAMINECTOMY/ DECOMPRESSION       POSTERIOR CERVICAL LAMINECTOMY  PAIN:  Are you having pain? Yes: NPRS scale:  current 8/10; Pain location: generalized Pain description: N&T arms; right foot numbness, mid back to neck sharp Aggravating factors: walking, standing, showering, toileting Relieving factors: nothing right now; meds; ice packs  PRECAUTIONS: Cervical and Back  WEIGHT BEARING RESTRICTIONS: No  FALLS:  Has patient fallen in last 6 months? No  LIVING ENVIRONMENT: Lives with: lives with their family Lives in: House/apartment Stairs: No Has following equipment at home: Single point cane  OCCUPATION: disabled  PLOF: Needs assistance with ADLs  PATIENT GOALS: decrease pain, improve QOL, walk through grocery store  NEXT MD VISIT:   OBJECTIVE:     PATIENT SURVEYS:  FOTO Primary score 28% with goal of 34% FOTO 03/25/23: 38%   SCREENING FOR RED FLAGS: No  COGNITION: Overall cognitive status: Within functional limits for tasks assessed     SENSATION: P&N throughout ue to hands and le through feet  MUSCLE LENGTH: Hamstrings: full knee extension in sitting   POSTURE: rounded, elevated shoulders, forward head, and flexed trunk   PALPATION: Ms tightness and TTP throughout traps, post cervical spine, erector sp through lumbar spine   Cervical ROM: extension -10d; flex 40d; rotation R/L ~20d 04/27/23:  cervical rotation:  L 38, R 31;  Lateral flexion L 15, R 20   LUMBAR ROM:   AROM eval 04/23/23  Flexion 25% full  Extension -20d -5d  Right lateral flexion 25%    Left lateral flexion 25%   Right rotation  ~20d  Left rotation  ~20d   (Blank rows = not tested)  LOWER Extremity  [facility]  LOWER EXTREMITY MMT:      Strength testing limited by pain MMT Right eval Left eval Right / Left 04/23/23 Right / Left 05/05/23  Hip flexion 3 3 3+/5 3+ -  4  Hip extension      Hip abduction 3 3 4/5 4/5  Hip adduction      Hip internal rotation      Hip external rotation      Knee flexion      Knee extension 3 3 4/5 4+/5  Ankle dorsiflexion      Ankle plantarflexion      Ankle inversion      Ankle eversion       (Blank rows = not tested)    FUNCTIONAL TESTS:  5 times sit to stand: unable to tolerate Timed up and go (TUG): 21.61 Berg Balance Scale: 22/56  04/02/23:  TUG 20.72s  04/23/23: Sharlene Motts: 32/56   5 x STS:26.24 with use of arms      05/05/23: TUG:19.41    05/14/23: TUG 16.06 sec;     5x STS (with use of arms) 24.26      GAIT: Distance walked: 200 Assistive device utilized: None Level of assistance: Modified independence Comments: Forward flex positioning, short step length, limited hip/knee flex with heel strike, slowed cadence, guarded  TODAY'S TREATMENT:  TUG/  5xSTS  Pt seen for aquatic therapy today.  Treatment took place in water 3.5-4.75 ft in depth at the [facility] pool. Temp of water was 91.  Pt entered/exited the pool via stairs in step-to pattern with bilat rail.  *unsupported with arm swing:  walking forward and backward  in 4+ ft  x 2 *side stepping rainbow ->rainbow hand floats with addct x 3 laps.  *alternating bow and arrow yellow hand floats,moving across width of pool backward x 2 laps * holding drain- single arm overhead reach x 2 each side (limited range, increased neck pain) * tricep push downs with yellow hand floats x 15; staggered stance with bilat horiz abdct with yellow hand floats  2sets of 5 * wide stance with arms abdct 45- head nods to the hand floats, alternating, 5x each way * straddling yellow noodle UE on white barbell: cycling forward 3 laps  * L stretch for lat stretch, cues for straight elbows, knees slightly bent, head neutral looking down into water, x 10s x 2  PATIENT EDUCATION:  Education details: posture awareness, aquatic therapy exercise modifications Person educated: Patient Education method: Explanation Education comprehension: verbalized understanding  HOME EXERCISE PROGRAM: Access Code: [email or web address] URL: [email or web address] Date: 03/16/2023 Prepared by: Geni Bers  Exercises - Seated Cervical Rotation AROM  - 1 x daily - 7 x weekly - 3 sets - 10 reps - Seated Cervical Extension AROM  - 1 x daily - 7 x weekly - 3 sets - 10 reps - Seated Passive Cervical Retraction  - 1 x daily - 7 x weekly - 3 sets - 10 reps - Seated Scapular Retraction  - 1 x daily - 7 x weekly - 3 sets - 10 reps  ASSESSMENT:  CLINICAL IMPRESSION: 5x STS and TUG time gradually improving.  She is challenged with head nods and any over head reach;  pain in neck and back gently eased with suspended cycling.  She reports reduction in overall pain while in wter.  Discussed being mindful of posture at home (ie: allowing head/neck to use available ROM instead of holding it stiff).  Pt is progressing gradually towards remaining goals.    Awaiting approval from [place] for additional visits; utilizing BCBS for visits after #15.      OBJECTIVE IMPAIRMENTS: Abnormal gait, decreased activity tolerance, decreased balance, decreased endurance, decreased mobility, difficulty walking, decreased ROM, decreased strength, impaired sensation, improper body mechanics, postural dysfunction, obesity, and pain.   ACTIVITY LIMITATIONS: carrying, lifting, bending, sitting, standing, squatting, sleeping, stairs, transfers, bed mobility, bathing, toileting, reach over head, locomotion  level, and caring for others  PARTICIPATION LIMITATIONS: meal prep, cleaning, laundry, driving, shopping, community activity, occupation, and yard work  PERSONAL FACTORS: Fitness, Past/current experiences, and Time since onset of injury/illness/exacerbation are also affecting patient's functional outcome.   REHAB POTENTIAL: Good  CLINICAL DECISION MAKING: Evolving/moderate complexity  EVALUATION COMPLEXITY: Moderate   GOALS: Goals reviewed with patient? Yes  SHORT TERM GOALS: Target date: 04/16/23  Pt will tolerate full aquatic sessions consistently without increase in pain and with improving function to demonstrate good toleration and effectiveness of intervention.   Baseline: Goal status: MET - 04/02/23  2.  Pt will improve on Tug test to <or=  15s to demonstrate improvement in lower extremity function, mobility and decreased fall risk.  Baseline:21.61  Goal status: IN PROGRESS 04/14/23; 19.41 05/05/23 Ongoing  3.  Pt will obtain DME's as needed to improve safety with ADL's and amb Baseline: received shower chair, reacher,  and rollator  Goal status: met-  05/10/23  4.  Pt will have a decrease in her minimal pain to </= 4/10 Baseline: 8/10 Goal status: In progress (5/10 on 05/03/23)  5.  Pt will report completing showering without assist and apprehensiveness of falling Baseline: afraid of falling/not wanting to ask for help Goal status: Met 04/14/23    LONG TERM GOALS: Target date: 06/18/23  Pt to meet stated Foto Goal of 34% Baseline: 28% Goal status:MET - 03/25/23  2.  Pt will improve strength in all areas listed by  1 grade (MMT, plan to measure using HHD when tolerated)  to demonstrate improved overall physical function  Baseline: see chart Goal status: In Progress 05/05/23  3.  Pt will improve on Berg balance test to >/=  50/56 to demonstrate a decrease in fall risk. Baseline: 22/56 at eval; (32/56 on 04/23/23) Goal status: In progress   4.  Pt will tolerate walking to  and from setting and engaging in aquatic therapy session without excessive fatigue or increase in pain to demonstrate improved toleration to activity.  Baseline: pain and multiple rest periods Goal status: Met 04/23/23  5.  Pt will be able to tolerate walking through grocery store without increase in pain and without seated rest period to demonstrate improved toleration to activity Baseline: unable (personal goal) Goal status: ongoing 04/23/23  6.  Pt will have an overall decrease in max pain to </= 4/10 for improved QOL Baseline: 8/10 Goal status: In progress 04/23/23  PLAN:  PT FREQUENCY: 1-2x/week  PT DURATION: 8 weeks   PLANNED INTERVENTIONS: Therapeutic exercises, Therapeutic activity, Neuromuscular re-education, Balance training, Gait training, Patient/Family education, Self Care, Joint mobilization, Stair training, Orthotic/Fit training, DME instructions, Aquatic Therapy, Dry Needling, Cryotherapy, Moist heat, Taping, Ionotophoresis 4mg /ml Dexamethasone, Manual therapy, and Re-evaluation.  PLAN FOR NEXT SESSION: aquatic intervention for general strengthening and mobility, stretching/ROM, pain reduction, posture correction, gait cervical through lumbar spine. Land: DN as approp upper traps; core/le strengthening; stair climbing; posture correction  Mayer Camel, PTA 05/14/23 11:16 AM [facility] [address] [place], [place], [address] Phone: [phone]   Fax:  [phone]

## 2023-05-18 ENCOUNTER — Encounter (HOSPITAL_BASED_OUTPATIENT_CLINIC_OR_DEPARTMENT_OTHER): Payer: Self-pay | Admitting: Physical Therapy

## 2023-05-18 ENCOUNTER — Ambulatory Visit (HOSPITAL_BASED_OUTPATIENT_CLINIC_OR_DEPARTMENT_OTHER): Payer: PRIVATE HEALTH INSURANCE | Admitting: Physical Therapy

## 2023-05-18 DIAGNOSIS — R293 Abnormal posture: Secondary | ICD-10-CM

## 2023-05-18 DIAGNOSIS — M549 Dorsalgia, unspecified: Secondary | ICD-10-CM

## 2023-05-18 DIAGNOSIS — M6281 Muscle weakness (generalized): Secondary | ICD-10-CM

## 2023-05-18 NOTE — Therapy (Signed)
OUTPATIENT PHYSICAL THERAPY [facility] TREATMENT  Department of [facility]  [id number] 15 visits approved from 12/18/22-04/17/23 [facility] Contact: Ardelia Mems [phone]   Patient Name: Toni Lewis MRN: [id number] DOB:1966/04/21, 56 y.o., female Today's Date: 05/18/2023  END OF SESSION:  PT End of Session - 05/18/23 1129     Visit Number 21    Number of Visits 28    Date for PT Re-Evaluation 06/18/23    Authorization Type VA (for first 15); BCBS and [employer] (for visit 16 and beyond)    PT Start Time 1119   pt arrived late to pool area   PT Stop Time 1157    PT Time Calculation (min) 38 min    Behavior During Therapy [facility] for tasks assessed/performed               Past Medical History:  Diagnosis Date   Anal fissure    Anxiety    Depression    Endometriosis    Headache    Hemorrhoids    Hypertension    Peptic ulcer disease    Past Surgical History:  Procedure Laterality Date   COLONOSCOPY  2022   ENDOMETRIAL ABLATION     LUMBAR LAMINECTOMY/ DECOMPRESSION WITH MET-RX Right 10/30/2021   Procedure: Right Lumbar Three-Four Lumbar Four-Five Laminotomy, Discectomy with metrx;  Surgeon: Barnett Abu, MD;  Location: [facility] OR;  Service: Neurosurgery;  Laterality: Right;   POSTERIOR CERVICAL LAMINECTOMY WITH MET- RX Bilateral 10/30/2021   Procedure: Bilateral Cervical Five-Six Foraminotomy w/Metrx;  Surgeon: Barnett Abu, MD;  Location: [facility] OR;  Service: Neurosurgery;  Laterality: Bilateral;   TONSILLECTOMY     Patient Active Problem List   Diagnosis Date Noted   Cervical disc disorder with radiculopathy of cervical region 10/30/2021   Benign essential HTN 02/01/2014   Chest pain 02/01/2014   Syncope, vasovagal 02/01/2014   Palpitations 02/01/2014    PCP: Greta Doom, MD   REFERRING PROVIDER: Greta Doom, MD   REFERRING DIAG:  M54.9 (ICD-10-CM) - Dorsalgia, unspecified   Rationale for Evaluation and Treatment: Rehabilitation  THERAPY DIAG:   Dorsalgia of multiple sites in spine  Abnormal posture  Muscle weakness-general  ONSET DATE: >10 yrs  SUBJECTIVE:                                                                                                                                                                                           SUBJECTIVE STATEMENT: Pt reports she did not sleep well last night and has been rushing this morning to get here.  She plans to look at budget this week and determine if she can afford membership  to [employer] (for use of pool).    She is accompanied by her mom (on deck)  PERTINENT HISTORY:  10/30/21: Surgeon Barnett Abu     LUMBAR LAMINECTOMY/ DECOMPRESSION       POSTERIOR CERVICAL LAMINECTOMY  PAIN:  Are you having pain? Yes: NPRS scale: current 7/10; Pain location: generalized Pain description: N&T arms; right foot numbness, mid back to neck sharp Aggravating factors: walking, standing, showering, toileting Relieving factors: nothing right now; meds; ice packs  PRECAUTIONS: Cervical and Back  WEIGHT BEARING RESTRICTIONS: No  FALLS:  Has patient fallen in last 6 months? No  LIVING ENVIRONMENT: Lives with: lives with their family Lives in: House/apartment Stairs: No Has following equipment at home: Single point cane  OCCUPATION: disabled  PLOF: Needs assistance with ADLs  PATIENT GOALS: decrease pain, improve QOL, walk through grocery store  NEXT MD VISIT: TBA  OBJECTIVE:     PATIENT SURVEYS:  FOTO Primary score 28% with goal of 34% FOTO 03/25/23: 38%   SCREENING FOR RED FLAGS: No  COGNITION: Overall cognitive status: Within functional limits for tasks assessed     SENSATION: P&N throughout ue to hands and le through feet  MUSCLE LENGTH: Hamstrings: full knee extension in sitting   POSTURE: rounded, elevated shoulders, forward head, and flexed trunk   PALPATION: MS tightness and TTP throughout traps, post cervical spine, erector sp through lumbar  spine   Cervical ROM: extension -10d; flex 40d; rotation R/L ~20d 04/27/23:  cervical rotation:  L 38, R 31;  Lateral flexion L 15, R 20   LUMBAR ROM:   AROM eval 04/23/23  Flexion 25% full  Extension -20d -5d  Right lateral flexion 25%   Left lateral flexion 25%   Right rotation  ~20d  Left rotation  ~20d   (Blank rows = not tested)  LOWER Extremity  [facility]  LOWER EXTREMITY MMT:      Strength testing limited by pain MMT Right eval Left eval Right / Left 04/23/23 Right / Left 05/05/23  Hip flexion 3 3 3+/5 3+ -  4  Hip extension      Hip abduction 3 3 4/5 4/5  Hip adduction      Hip internal rotation      Hip external rotation      Knee flexion      Knee extension 3 3 4/5 4+/5  Ankle dorsiflexion      Ankle plantarflexion      Ankle inversion      Ankle eversion       (Blank rows = not tested)    FUNCTIONAL TESTS:  5 times sit to stand: unable to tolerate Timed up and go (TUG): 21.61 Berg Balance Scale: 22/56  04/02/23:  TUG 20.72s  04/23/23: Sharlene Motts: 32/56   5 x STS:26.24 with use of arms      05/05/23: TUG:19.41    05/14/23: TUG 16.06 sec;     5x STS (with use of arms) 24.26      GAIT: Distance walked: 200 Assistive device utilized: None Level of assistance: Modified independence Comments: Forward flex positioning, short step length, limited hip/knee flex with heel strike, slowed cadence, guarded  TODAY'S TREATMENT:  Pt seen for aquatic therapy today.  Treatment took place in water 3.5-4.75 ft in depth at the [facility] pool. Temp of water was 91.  Pt entered/exited the pool via stairs in step-to pattern with bilat rail.  *unsupported with arm swing:  walking forward and backward  in 4+ ft  x 2 *side stepping rainbow ->rainbow hand floats with addct x 2 *alternating bow and arrow yellow hand floats,moving across width of pool  backward x 2 laps * SLS without UE support (skulling to steady) x 15 sec x 2 each LE (challenge!) * bilat arm raise towards ceiling, x 5 (difficult, painful- doesn't reach full range of elbow or shoulder) * TrA set with short hollow noodle pull downs with isometric holds on return to surface x 8 * staggered stance with bilat horiz abdct/ addct with yellow hand floats x 5 each leg forward * tricep push downs with rainbow hand floats x 5, yellow hand floats x 5 * straddling yellow noodle UE on white barbell: cycling forward 3 laps  * back float at stairs with UE on rails, x 2 reps, 30 sec with cues for hip hinge and forward head to return to upright (therapist close SBA giving tactile/verbal cues)   PATIENT EDUCATION:  Education details: posture awareness, aquatic therapy exercise modifications Person educated: Patient Education method: Explanation Education comprehension: verbalized understanding  HOME EXERCISE PROGRAM: Access Code: [email or web address] URL: [email or web address] Date: 03/16/2023 Prepared by: Geni Bers  Exercises - Seated Cervical Rotation AROM  - 1 x daily - 7 x weekly - 3 sets - 10 reps - Seated Cervical Extension AROM  - 1 x daily - 7 x weekly - 3 sets - 10 reps - Seated Passive Cervical Retraction  - 1 x daily - 7 x weekly - 3 sets - 10 reps - Seated Scapular Retraction  - 1 x daily - 7 x weekly - 3 sets - 10 reps  ASSESSMENT:  CLINICAL IMPRESSION: Pt is challenged with SLS and any over head reach; increased pain after these exercises. Pain is reduced with rest and change in exercise.  She is able to stand up right, but needs occasional cues.  Neck Flex/ ext and lateral flex continues to be limited and guarded.  She reports reduction in overall pain while in water to 4/10.  Pt to begin land visits on Friday.   Pt is progressing gradually towards remaining goals.    Awaiting approval from [place] for additional visits; utilizing BCBS for visits after #15.       OBJECTIVE IMPAIRMENTS: Abnormal gait, decreased activity tolerance, decreased balance, decreased endurance, decreased mobility, difficulty walking, decreased ROM, decreased strength, impaired sensation, improper body mechanics, postural dysfunction, obesity, and pain.   ACTIVITY LIMITATIONS: carrying, lifting, bending, sitting, standing, squatting, sleeping, stairs, transfers, bed mobility, bathing, toileting, reach over head, locomotion level, and caring for others  PARTICIPATION LIMITATIONS: meal prep, cleaning, laundry, driving, shopping, community activity, occupation, and yard work  PERSONAL FACTORS: Fitness, Past/current experiences, and Time since onset of injury/illness/exacerbation are also affecting patient's functional outcome.   REHAB POTENTIAL: Good  CLINICAL DECISION MAKING: Evolving/moderate complexity  EVALUATION COMPLEXITY: Moderate   GOALS: Goals reviewed with patient? Yes  SHORT TERM GOALS: Target date: 04/16/23  Pt will tolerate full aquatic sessions consistently without increase in pain and with improving function to demonstrate good toleration and effectiveness of intervention.   Baseline: Goal status: MET - 04/02/23  2.  Pt will improve on Tug test to <or=  15s to demonstrate  improvement in lower extremity function, mobility and decreased fall risk.  Baseline:21.61  Goal status: IN PROGRESS 04/14/23; 19.41 05/05/23 Ongoing  3.  Pt will obtain DME's as needed to improve safety with ADL's and amb Baseline: received shower chair, reacher, and rollator  Goal status: met-  05/10/23  4.  Pt will have a decrease in her minimal pain to </= 4/10 Baseline: 8/10 at eval;  4/10 while in water  Goal status: Met 05/18/23  5.  Pt will report completing showering without assist and apprehensiveness of falling Baseline: afraid of falling/not wanting to ask for help Goal status: Met 04/14/23    LONG TERM GOALS: Target date: 06/18/23  Pt to meet stated Foto Goal of  34% Baseline: 28% Goal status:MET - 03/25/23  2.  Pt will improve strength in all areas listed by  1 grade (MMT, plan to measure using HHD when tolerated)  to demonstrate improved overall physical function  Baseline: see chart Goal status: In Progress 05/05/23  3.  Pt will improve on Berg balance test to >/=  50/56 to demonstrate a decrease in fall risk. Baseline: 22/56 at eval; (32/56 on 04/23/23) Goal status: In progress   4.  Pt will tolerate walking to and from setting and engaging in aquatic therapy session without excessive fatigue or increase in pain to demonstrate improved toleration to activity.  Baseline: pain and multiple rest periods Goal status: Met 04/23/23  5.  Pt will be able to tolerate walking through grocery store without increase in pain and without seated rest period to demonstrate improved toleration to activity Baseline: unable (personal goal)-eval;  walked 15 min in [employer] with increase in pain 05/05/23 Goal status: ongoing   6.  Pt will have an overall decrease in max pain to </= 4/10 for improved QOL Baseline: 8/10 Goal status: In progress 04/23/23  PLAN:  PT FREQUENCY: 1-2x/week  PT DURATION: 8 weeks   PLANNED INTERVENTIONS: Therapeutic exercises, Therapeutic activity, Neuromuscular re-education, Balance training, Gait training, Patient/Family education, Self Care, Joint mobilization, Stair training, Orthotic/Fit training, DME instructions, Aquatic Therapy, Dry Needling, Cryotherapy, Moist heat, Taping, Ionotophoresis 4mg /ml Dexamethasone, Manual therapy, and Re-evaluation.  PLAN FOR NEXT SESSION: aquatic intervention for general strengthening and mobility, stretching/ROM, pain reduction, posture correction, gait cervical through lumbar spine. Land: DN as approp upper traps; core/le strengthening; stair climbing; posture correction  Mayer Camel, PTA 05/18/23 12:38 PM [facility] [address] [place], [place], [address] Phone: [phone]   Fax:  [phone]

## 2023-05-20 ENCOUNTER — Ambulatory Visit (HOSPITAL_BASED_OUTPATIENT_CLINIC_OR_DEPARTMENT_OTHER): Payer: No Typology Code available for payment source | Attending: Internal Medicine | Admitting: Physical Therapy

## 2023-05-20 DIAGNOSIS — R293 Abnormal posture: Secondary | ICD-10-CM | POA: Diagnosis present

## 2023-05-20 DIAGNOSIS — M549 Dorsalgia, unspecified: Secondary | ICD-10-CM | POA: Insufficient documentation

## 2023-05-20 DIAGNOSIS — M6281 Muscle weakness (generalized): Secondary | ICD-10-CM | POA: Diagnosis present

## 2023-05-20 NOTE — Therapy (Signed)
OUTPATIENT PHYSICAL THERAPY [facility] TREATMENT  Department of [facility]  [id number] 15 visits approved from 12/18/22-04/17/23 [facility] Contact: Ardelia Mems [phone]   Patient Name: Toni Lewis MRN: [id number] DOB:18-Apr-1966, 57 y.o., female Today's Date: 05/21/2023  END OF SESSION:  PT End of Session - 05/20/23 1330     Visit Number 22    Number of Visits 28    Date for PT Re-Evaluation 06/18/23    Authorization Type VA (for first 15); BCBS and [employer] (for visit 16 and beyond)    PT Start Time 1305    PT Stop Time 1345    PT Time Calculation (min) 40 min    Activity Tolerance Patient tolerated treatment well    Behavior During Therapy [facility] for tasks assessed/performed                Past Medical History:  Diagnosis Date   Anal fissure    Anxiety    Depression    Endometriosis    Headache    Hemorrhoids    Hypertension    Peptic ulcer disease    Past Surgical History:  Procedure Laterality Date   COLONOSCOPY  2022   ENDOMETRIAL ABLATION     LUMBAR LAMINECTOMY/ DECOMPRESSION WITH MET-RX Right 10/30/2021   Procedure: Right Lumbar Three-Four Lumbar Four-Five Laminotomy, Discectomy with metrx;  Surgeon: Barnett Abu, MD;  Location: [facility] OR;  Service: Neurosurgery;  Laterality: Right;   POSTERIOR CERVICAL LAMINECTOMY WITH MET- RX Bilateral 10/30/2021   Procedure: Bilateral Cervical Five-Six Foraminotomy w/Metrx;  Surgeon: Barnett Abu, MD;  Location: [facility] OR;  Service: Neurosurgery;  Laterality: Bilateral;   TONSILLECTOMY     Patient Active Problem List   Diagnosis Date Noted   Cervical disc disorder with radiculopathy of cervical region 10/30/2021   Benign essential HTN 02/01/2014   Chest pain 02/01/2014   Syncope, vasovagal 02/01/2014   Palpitations 02/01/2014    PCP: Greta Doom, MD   REFERRING PROVIDER: Greta Doom, MD   REFERRING DIAG:  M54.9 (ICD-10-CM) - Dorsalgia, unspecified   Rationale for Evaluation and Treatment:  Rehabilitation  THERAPY DIAG:  Dorsalgia of multiple sites in spine  Abnormal posture  Muscle weakness-general  ONSET DATE: >10 yrs  SUBJECTIVE:                                                                                                                                                                                           SUBJECTIVE STATEMENT: The patient reports she has been sore. She reports the pool has been helping. She plans on joing [facility] soon. She is a little aprehensive about land therapy.  She is  accompanied by her mom (on deck)  PERTINENT HISTORY:  10/30/21: Surgeon Barnett Abu     LUMBAR LAMINECTOMY/ DECOMPRESSION       POSTERIOR CERVICAL LAMINECTOMY  PAIN:  Are you having pain? Yes: NPRS scale: current 7/10; Pain location: generalized Pain description: N&T arms; right foot numbness, mid back to neck sharp Aggravating factors: walking, standing, showering, toileting Relieving factors: nothing right now; meds; ice packs  PRECAUTIONS: Cervical and Back  WEIGHT BEARING RESTRICTIONS: No  FALLS:  Has patient fallen in last 6 months? No  LIVING ENVIRONMENT: Lives with: lives with their family Lives in: House/apartment Stairs: No Has following equipment at home: Single point cane  OCCUPATION: disabled  PLOF: Needs assistance with ADLs  PATIENT GOALS: decrease pain, improve QOL, walk through grocery store  NEXT MD VISIT: TBA  OBJECTIVE:     PATIENT SURVEYS:  FOTO Primary score 28% with goal of 34% FOTO 03/25/23: 38%   SCREENING FOR RED FLAGS: No  COGNITION: Overall cognitive status: Within functional limits for tasks assessed     SENSATION: P&N throughout ue to hands and le through feet  MUSCLE LENGTH: Hamstrings: full knee extension in sitting   POSTURE: rounded, elevated shoulders, forward head, and flexed trunk   PALPATION: MS tightness and TTP throughout traps, post cervical spine, erector sp through lumbar spine   Cervical ROM:  extension -10d; flex 40d; rotation R/L ~20d 04/27/23:  cervical rotation:  L 38, R 31;  Lateral flexion L 15, R 20   LUMBAR ROM:   AROM eval 04/23/23  Flexion 25% full  Extension -20d -5d  Right lateral flexion 25%   Left lateral flexion 25%   Right rotation  ~20d  Left rotation  ~20d   (Blank rows = not tested)  LOWER Extremity  [facility]  LOWER EXTREMITY MMT:      Strength testing limited by pain MMT Right eval Left eval Right / Left 04/23/23 Right / Left 05/05/23  Hip flexion 3 3 3+/5 3+ -  4  Hip extension      Hip abduction 3 3 4/5 4/5  Hip adduction      Hip internal rotation      Hip external rotation      Knee flexion      Knee extension 3 3 4/5 4+/5  Ankle dorsiflexion      Ankle plantarflexion      Ankle inversion      Ankle eversion       (Blank rows = not tested)    FUNCTIONAL TESTS:  5 times sit to stand: unable to tolerate Timed up and go (TUG): 21.61 Berg Balance Scale: 22/56  04/02/23:  TUG 20.72s  04/23/23: Sharlene Motts: 32/56   5 x STS:26.24 with use of arms      05/05/23: TUG:19.41    05/14/23: TUG 16.06 sec;     5x STS (with use of arms) 24.26      GAIT: Distance walked: 200 Assistive device utilized: None Level of assistance: Modified independence Comments: Forward flex positioning, short step length, limited hip/knee flex with heel strike, slowed cadence, guarded  TODAY'S TREATMENT:  8/1 Ball roll x5 5 sec hold  Lateral x5 5 sec hold   Reviewed self soft tissue mobilization using thera-cane and tennis ball.   Seated core stabilization  Hip abduction 2x12 red  Bilateral er 2x12 red   Therapy worked on TA breathing with patient. Patient has a tendency to hod her breath. We educated her on the trnasverse abdominal muscle and its function.     Last visit: Pt seen for aquatic therapy today.  Treatment took place in water  3.5-4.75 ft in depth at the [facility] pool. Temp of water was 91.  Pt entered/exited the pool via stairs in step-to pattern with bilat rail.  *unsupported with arm swing:  walking forward and backward  in 4+ ft  x 2 *side stepping rainbow ->rainbow hand floats with addct x 2 *alternating bow and arrow yellow hand floats,moving across width of pool backward x 2 laps * SLS without UE support (skulling to steady) x 15 sec x 2 each LE (challenge!) * bilat arm raise towards ceiling, x 5 (difficult, painful- doesn't reach full range of elbow or shoulder) * TrA set with short hollow noodle pull downs with isometric holds on return to surface x 8 * staggered stance with bilat horiz abdct/ addct with yellow hand floats x 5 each leg forward * tricep push downs with rainbow hand floats x 5, yellow hand floats x 5 * straddling yellow noodle UE on white barbell: cycling forward 3 laps  * back float at stairs with UE on rails, x 2 reps, 30 sec with cues for hip hinge and forward head to return to upright (therapist close SBA giving tactile/verbal cues)   PATIENT EDUCATION:  Education details: posture awareness, aquatic therapy exercise modifications Person educated: Patient Education method: Explanation Education comprehension: verbalized understanding  HOME EXERCISE PROGRAM: Access Code: [email or web address] URL: [email or web address] Date: 03/16/2023 Prepared by: Geni Bers  Exercises - Seated Cervical Rotation AROM  - 1 x daily - 7 x weekly - 3 sets - 10 reps - Seated Cervical Extension AROM  - 1 x daily - 7 x weekly - 3 sets - 10 reps - Seated Passive Cervical Retraction  - 1 x daily - 7 x weekly - 3 sets - 10 reps - Seated Scapular Retraction  - 1 x daily - 7 x weekly - 3 sets - 10 reps  ASSESSMENT:  CLINICAL IMPRESSION: The patient tolerated treatment well. She did feel a little " catch" in her back with stretching. She was also able to sit and stand a bit straighter  following her stretches. We gave her 2 stretches and 2 exercises to work on. We will build her program up slowly so we can focusin on what is working and not working The patient will be in the pool next.    Awaiting approval from [place] for additional visits; utilizing BCBS for visits after #15.      OBJECTIVE IMPAIRMENTS: Abnormal gait, decreased activity tolerance, decreased balance, decreased endurance, decreased mobility, difficulty walking, decreased ROM, decreased strength, impaired sensation, improper body mechanics, postural dysfunction, obesity, and pain.   ACTIVITY LIMITATIONS: carrying, lifting, bending, sitting, standing, squatting, sleeping, stairs, transfers, bed mobility, bathing, toileting, reach over head, locomotion level, and caring for others  PARTICIPATION LIMITATIONS: meal prep, cleaning, laundry, driving, shopping, community activity, occupation, and yard work  PERSONAL FACTORS: Fitness, Past/current experiences, and Time since onset of injury/illness/exacerbation are also affecting patient's functional outcome.   REHAB POTENTIAL: Good  CLINICAL DECISION MAKING: Evolving/moderate complexity  EVALUATION  COMPLEXITY: Moderate   GOALS: Goals reviewed with patient? Yes  SHORT TERM GOALS: Target date: 04/16/23  Pt will tolerate full aquatic sessions consistently without increase in pain and with improving function to demonstrate good toleration and effectiveness of intervention.   Baseline: Goal status: MET - 04/02/23  2.  Pt will improve on Tug test to <or=  15s to demonstrate improvement in lower extremity function, mobility and decreased fall risk.  Baseline:21.61  Goal status: IN PROGRESS 04/14/23; 19.41 05/05/23 Ongoing  3.  Pt will obtain DME's as needed to improve safety with ADL's and amb Baseline: received shower chair, reacher, and rollator  Goal status: met-  05/10/23  4.  Pt will have a decrease in her minimal pain to </= 4/10 Baseline: 8/10 at eval;  4/10  while in water  Goal status: Met 05/18/23  5.  Pt will report completing showering without assist and apprehensiveness of falling Baseline: afraid of falling/not wanting to ask for help Goal status: Met 04/14/23    LONG TERM GOALS: Target date: 06/18/23  Pt to meet stated Foto Goal of 34% Baseline: 28% Goal status:MET - 03/25/23  2.  Pt will improve strength in all areas listed by  1 grade (MMT, plan to measure using HHD when tolerated)  to demonstrate improved overall physical function  Baseline: see chart Goal status: In Progress 05/05/23  3.  Pt will improve on Berg balance test to >/=  50/56 to demonstrate a decrease in fall risk. Baseline: 22/56 at eval; (32/56 on 04/23/23) Goal status: In progress   4.  Pt will tolerate walking to and from setting and engaging in aquatic therapy session without excessive fatigue or increase in pain to demonstrate improved toleration to activity.  Baseline: pain and multiple rest periods Goal status: Met 04/23/23  5.  Pt will be able to tolerate walking through grocery store without increase in pain and without seated rest period to demonstrate improved toleration to activity Baseline: unable (personal goal)-eval;  walked 15 min in [employer] with increase in pain 05/05/23 Goal status: ongoing   6.  Pt will have an overall decrease in max pain to </= 4/10 for improved QOL Baseline: 8/10 Goal status: In progress 04/23/23  PLAN:  PT FREQUENCY: 1-2x/week  PT DURATION: 8 weeks   PLANNED INTERVENTIONS: Therapeutic exercises, Therapeutic activity, Neuromuscular re-education, Balance training, Gait training, Patient/Family education, Self Care, Joint mobilization, Stair training, Orthotic/Fit training, DME instructions, Aquatic Therapy, Dry Needling, Cryotherapy, Moist heat, Taping, Ionotophoresis 4mg /ml Dexamethasone, Manual therapy, and Re-evaluation.  PLAN FOR NEXT SESSION: aquatic intervention for general strengthening and mobility, stretching/ROM, pain  reduction, posture correction, gait cervical through lumbar spine. Land: DN as approp upper traps; core/le strengthening; stair climbing; posture correction  Lois Huxley PT DPT 05/21/23 9:12 AM [facility] [address] [place], [place], [address] Phone: [phone]   Fax:  [phone]

## 2023-05-25 ENCOUNTER — Encounter (HOSPITAL_BASED_OUTPATIENT_CLINIC_OR_DEPARTMENT_OTHER): Payer: Self-pay | Admitting: Physical Therapy

## 2023-05-25 ENCOUNTER — Ambulatory Visit (HOSPITAL_BASED_OUTPATIENT_CLINIC_OR_DEPARTMENT_OTHER): Payer: No Typology Code available for payment source | Admitting: Physical Therapy

## 2023-05-25 DIAGNOSIS — M549 Dorsalgia, unspecified: Secondary | ICD-10-CM | POA: Diagnosis not present

## 2023-05-25 DIAGNOSIS — M6281 Muscle weakness (generalized): Secondary | ICD-10-CM

## 2023-05-25 DIAGNOSIS — R293 Abnormal posture: Secondary | ICD-10-CM

## 2023-05-25 NOTE — Therapy (Signed)
OUTPATIENT PHYSICAL THERAPY [facility] TREATMENT  Department of [facility]  [id number] 15 visits approved from 12/18/22-04/17/23 [facility] Contact: Ardelia Mems [phone]   Patient Name: Toni Lewis MRN: [id number] DOB:1966-06-28, 57 y.o., female Today's Date: 05/25/2023  END OF SESSION:  PT End of Session - 05/25/23 0902     Visit Number 23    Number of Visits 28    Date for PT Re-Evaluation 06/18/23    Authorization Type VA (for first 15); BCBS and [employer] (for visit 16 and beyond)    PT Start Time 0904    PT Stop Time 0945    PT Time Calculation (min) 41 min    Activity Tolerance Patient tolerated treatment well    Behavior During Therapy [facility] for tasks assessed/performed                Past Medical History:  Diagnosis Date   Anal fissure    Anxiety    Depression    Endometriosis    Headache    Hemorrhoids    Hypertension    Peptic ulcer disease    Past Surgical History:  Procedure Laterality Date   COLONOSCOPY  2022   ENDOMETRIAL ABLATION     LUMBAR LAMINECTOMY/ DECOMPRESSION WITH MET-RX Right 10/30/2021   Procedure: Right Lumbar Three-Four Lumbar Four-Five Laminotomy, Discectomy with metrx;  Surgeon: Barnett Abu, MD;  Location: [facility] OR;  Service: Neurosurgery;  Laterality: Right;   POSTERIOR CERVICAL LAMINECTOMY WITH MET- RX Bilateral 10/30/2021   Procedure: Bilateral Cervical Five-Six Foraminotomy w/Metrx;  Surgeon: Barnett Abu, MD;  Location: [facility] OR;  Service: Neurosurgery;  Laterality: Bilateral;   TONSILLECTOMY     Patient Active Problem List   Diagnosis Date Noted   Cervical disc disorder with radiculopathy of cervical region 10/30/2021   Benign essential HTN 02/01/2014   Chest pain 02/01/2014   Syncope, vasovagal 02/01/2014   Palpitations 02/01/2014    PCP: Greta Doom, MD   REFERRING PROVIDER: Greta Doom, MD   REFERRING DIAG:  M54.9 (ICD-10-CM) - Dorsalgia, unspecified   Rationale for Evaluation and Treatment:  Rehabilitation  THERAPY DIAG:  Dorsalgia of multiple sites in spine  Abnormal posture  Muscle weakness-general  ONSET DATE: >10 yrs  SUBJECTIVE:                                                                                                                                                                                           SUBJECTIVE STATEMENT: The patient reports some soreness after 1st land based session immediately afterwards and the next day. She is accompanied by her mom  PERTINENT HISTORY:  10/30/21: Surgeon Barnett Abu  LUMBAR LAMINECTOMY/ DECOMPRESSION       POSTERIOR CERVICAL LAMINECTOMY  PAIN:  Are you having pain? Yes: NPRS scale: current 8/10; Pain location: generalized Pain description: N&T arms; right foot numbness, mid back to neck sharp Aggravating factors: walking, standing, showering, toileting Relieving factors: nothing right now; meds; ice packs  PRECAUTIONS: Cervical and Back  WEIGHT BEARING RESTRICTIONS: No  FALLS:  Has patient fallen in last 6 months? No  LIVING ENVIRONMENT: Lives with: lives with their family Lives in: House/apartment Stairs: No Has following equipment at home: Single point cane  OCCUPATION: disabled  PLOF: Needs assistance with ADLs  PATIENT GOALS: decrease pain, improve QOL, walk through grocery store  NEXT MD VISIT: TBA  OBJECTIVE:     PATIENT SURVEYS:  FOTO Primary score 28% with goal of 34% FOTO 03/25/23: 38%   SCREENING FOR RED FLAGS: No  COGNITION: Overall cognitive status: Within functional limits for tasks assessed     SENSATION: P&N throughout ue to hands and le through feet  MUSCLE LENGTH: Hamstrings: full knee extension in sitting   POSTURE: rounded, elevated shoulders, forward head, and flexed trunk   PALPATION: MS tightness and TTP throughout traps, post cervical spine, erector sp through lumbar spine   Cervical ROM: extension -10d; flex 40d; rotation R/L ~20d 04/27/23:  cervical  rotation:  L 38, R 31;  Lateral flexion L 15, R 20   LUMBAR ROM:   AROM eval 04/23/23  Flexion 25% full  Extension -20d -5d  Right lateral flexion 25%   Left lateral flexion 25%   Right rotation  ~20d  Left rotation  ~20d   (Blank rows = not tested)  LOWER Extremity  [facility]  LOWER EXTREMITY MMT:      Strength testing limited by pain MMT Right eval Left eval Right / Left 04/23/23 Right / Left 05/05/23  Hip flexion 3 3 3+/5 3+ -  4  Hip extension      Hip abduction 3 3 4/5 4/5  Hip adduction      Hip internal rotation      Hip external rotation      Knee flexion      Knee extension 3 3 4/5 4+/5  Ankle dorsiflexion      Ankle plantarflexion      Ankle inversion      Ankle eversion       (Blank rows = not tested)    FUNCTIONAL TESTS:  5 times sit to stand: unable to tolerate Timed up and go (TUG): 21.61 Berg Balance Scale: 22/56  04/02/23:  TUG 20.72s  04/23/23: Sharlene Motts: 32/56   5 x STS:26.24 with use of arms      05/05/23: TUG:19.41    05/14/23: TUG 16.06 sec;     5x STS (with use of arms) 24.26      GAIT: Distance walked: 200 Assistive device utilized: None Level of assistance: Modified independence Comments: Forward flex positioning, short step length, limited hip/knee flex with heel strike, slowed cadence, guarded  TODAY'S TREATMENT:  05/25/23 Pt seen for aquatic therapy today.  Treatment took place in water 3.5-4.75 ft in depth at the [facility] pool. Temp of water was 91.  Pt entered/exited the pool via stairs in step-to pattern with bilat rail.  *unsupported with arm swing:  walking forward and backward  in 4+ ft  x 2 *side stepping rainbow ->rainbow hand floats with addct x 2 *alternating bow and arrow yellow hand floats,moving across width of pool backward x 2 laps * staggered stance with bilat horiz abdct/ addct with yellow  hand floats x 10 each leg forward * tricep push downs with rainbow hand floats x 5, yellow hand floats x 10 * TrA set with solid noodle pull downs with isometric holds on return to surface x 8 wide stance then staggered * Tandem stance leading R/L initial skulling to steady, with time able to gain position and hold x 15s. SLS without UE support (skulling to steady) x 15 sec x 2 each LE (challenge!) * straddling yellow noodle UE on white barbell: cycling forward 3 laps   Pt requires the buoyancy and hydrostatic pressure of water for support, and to offload joints by unweighting joint load by at least 50 % in navel deep water and by at least 75-80% in chest to neck deep water.  Viscosity of the water is needed for resistance of strengthening. Water current perturbations provides challenge to standing balance requiring increased core activation.     Last visit: 8/1 Ball roll x5 5 sec hold  Lateral x5 5 sec hold   Reviewed self soft tissue mobilization using thera-cane and tennis ball.   Seated core stabilization  Hip abduction 2x12 red  Bilateral er 2x12 red   Therapy worked on TA breathing with patient. Patient has a tendency to hod her breath. We educated her on the trnasverse abdominal muscle and its function.     PATIENT EDUCATION:  Education details: posture awareness, aquatic therapy exercise modifications Person educated: Patient Education method: Explanation Education comprehension: verbalized understanding  HOME EXERCISE PROGRAM: Access Code: [email or web address] URL: [email or web address] Date: 03/16/2023 Prepared by: Geni Bers  Exercises - Seated Cervical Rotation AROM  - 1 x daily - 7 x weekly - 3 sets - 10 reps - Seated Cervical Extension AROM  - 1 x daily - 7 x weekly - 3 sets - 10 reps - Seated Passive Cervical Retraction  - 1 x daily - 7 x weekly - 3 sets - 10 reps - Seated Scapular Retraction  - 1 x daily - 7 x weekly - 3 sets - 10  reps  ASSESSMENT:  CLINICAL IMPRESSION: Pt has new membership at [employer].  She is instructed to go and trial the pool to become familiar with equipment and access into/out of pool. Plan to begin creating final aquatic HEP.  She is encouraged to continue with land based intervention for continued progression towards land based goals improving function. She does report that the supine suspension in water relieves cervical and upper thoracic pain. She demonstrates improvement with balance ability to hold tandem and sls.  Of note: pt with new center low back pain today coming into therapy.   Awaiting approval from [place] for additional visits; utilizing BCBS for visits after #15.      OBJECTIVE IMPAIRMENTS: Abnormal gait, decreased activity tolerance, decreased balance, decreased endurance, decreased mobility, difficulty walking, decreased ROM, decreased strength, impaired sensation, improper body mechanics, postural dysfunction, obesity, and pain.   ACTIVITY LIMITATIONS: carrying, lifting, bending, sitting, standing, squatting, sleeping,  stairs, transfers, bed mobility, bathing, toileting, reach over head, locomotion level, and caring for others  PARTICIPATION LIMITATIONS: meal prep, cleaning, laundry, driving, shopping, community activity, occupation, and yard work  PERSONAL FACTORS: Fitness, Past/current experiences, and Time since onset of injury/illness/exacerbation are also affecting patient's functional outcome.   REHAB POTENTIAL: Good  CLINICAL DECISION MAKING: Evolving/moderate complexity  EVALUATION COMPLEXITY: Moderate   GOALS: Goals reviewed with patient? Yes  SHORT TERM GOALS: Target date: 04/16/23  Pt will tolerate full aquatic sessions consistently without increase in pain and with improving function to demonstrate good toleration and effectiveness of intervention.   Baseline: Goal status: MET - 04/02/23  2.  Pt will improve on Tug test to <or=  15s to demonstrate  improvement in lower extremity function, mobility and decreased fall risk.  Baseline:21.61  Goal status: IN PROGRESS 04/14/23; 19.41 05/05/23 Ongoing  3.  Pt will obtain DME's as needed to improve safety with ADL's and amb Baseline: received shower chair, reacher, and rollator  Goal status: met-  05/10/23  4.  Pt will have a decrease in her minimal pain to </= 4/10 Baseline: 8/10 at eval;  4/10 while in water  Goal status: Met 05/18/23  5.  Pt will report completing showering without assist and apprehensiveness of falling Baseline: afraid of falling/not wanting to ask for help Goal status: Met 04/14/23    LONG TERM GOALS: Target date: 06/18/23  Pt to meet stated Foto Goal of 34% Baseline: 28% Goal status:MET - 03/25/23  2.  Pt will improve strength in all areas listed by  1 grade (MMT, plan to measure using HHD when tolerated)  to demonstrate improved overall physical function  Baseline: see chart Goal status: In Progress 05/05/23  3.  Pt will improve on Berg balance test to >/=  50/56 to demonstrate a decrease in fall risk. Baseline: 22/56 at eval; (32/56 on 04/23/23) Goal status: In progress   4.  Pt will tolerate walking to and from setting and engaging in aquatic therapy session without excessive fatigue or increase in pain to demonstrate improved toleration to activity.  Baseline: pain and multiple rest periods Goal status: Met 04/23/23  5.  Pt will be able to tolerate walking through grocery store without increase in pain and without seated rest period to demonstrate improved toleration to activity Baseline: unable (personal goal)-eval;  walked 15 min in [employer] with increase in pain 05/05/23 Goal status: ongoing   6.  Pt will have an overall decrease in max pain to </= 4/10 for improved QOL Baseline: 8/10 Goal status: In progress 04/23/23  PLAN:  PT FREQUENCY: 1-2x/week  PT DURATION: 8 weeks   PLANNED INTERVENTIONS: Therapeutic exercises, Therapeutic activity, Neuromuscular  re-education, Balance training, Gait training, Patient/Family education, Self Care, Joint mobilization, Stair training, Orthotic/Fit training, DME instructions, Aquatic Therapy, Dry Needling, Cryotherapy, Moist heat, Taping, Ionotophoresis 4mg /ml Dexamethasone, Manual therapy, and Re-evaluation.  PLAN FOR NEXT SESSION: aquatic intervention for general strengthening and mobility, stretching/ROM, pain reduction, posture correction, gait cervical through lumbar spine. Land: DN as approp upper traps; core/le strengthening; stair climbing; posture correction  Rushie Chestnut) Waniya Hoglund MPT 05/25/23 9:05 AM [facility] [address] [place], [place], [address] Phone: [phone]   Fax:  [phone]

## 2023-05-27 ENCOUNTER — Ambulatory Visit (HOSPITAL_BASED_OUTPATIENT_CLINIC_OR_DEPARTMENT_OTHER): Payer: No Typology Code available for payment source | Admitting: Physical Therapy

## 2023-05-27 ENCOUNTER — Encounter (HOSPITAL_BASED_OUTPATIENT_CLINIC_OR_DEPARTMENT_OTHER): Payer: Self-pay | Admitting: Physical Therapy

## 2023-05-27 DIAGNOSIS — R293 Abnormal posture: Secondary | ICD-10-CM

## 2023-05-27 DIAGNOSIS — M549 Dorsalgia, unspecified: Secondary | ICD-10-CM | POA: Diagnosis not present

## 2023-05-27 DIAGNOSIS — M6281 Muscle weakness (generalized): Secondary | ICD-10-CM

## 2023-05-27 NOTE — Therapy (Signed)
OUTPATIENT PHYSICAL THERAPY [facility] TREATMENT  Department of [facility]  [id number] 15 visits approved from 12/18/22-04/17/23 [facility] Contact: Ardelia Mems [phone]   Patient Name: Toni Lewis MRN: [id number] DOB:09/21/1966, 57 y.o., female Today's Date: 05/27/2023  END OF SESSION:  PT End of Session - 05/27/23 1122     Visit Number 24    Number of Visits 28    Date for PT Re-Evaluation 06/18/23    PT Start Time 1055    PT Stop Time 1135    PT Time Calculation (min) 40 min    Activity Tolerance Patient tolerated treatment well    Behavior During Therapy [facility] for tasks assessed/performed                 Past Medical History:  Diagnosis Date   Anal fissure    Anxiety    Depression    Endometriosis    Headache    Hemorrhoids    Hypertension    Peptic ulcer disease    Past Surgical History:  Procedure Laterality Date   COLONOSCOPY  2022   ENDOMETRIAL ABLATION     LUMBAR LAMINECTOMY/ DECOMPRESSION WITH MET-RX Right 10/30/2021   Procedure: Right Lumbar Three-Four Lumbar Four-Five Laminotomy, Discectomy with metrx;  Surgeon: Barnett Abu, MD;  Location: [facility] OR;  Service: Neurosurgery;  Laterality: Right;   POSTERIOR CERVICAL LAMINECTOMY WITH MET- RX Bilateral 10/30/2021   Procedure: Bilateral Cervical Five-Six Foraminotomy w/Metrx;  Surgeon: Barnett Abu, MD;  Location: [facility] OR;  Service: Neurosurgery;  Laterality: Bilateral;   TONSILLECTOMY     Patient Active Problem List   Diagnosis Date Noted   Cervical disc disorder with radiculopathy of cervical region 10/30/2021   Benign essential HTN 02/01/2014   Chest pain 02/01/2014   Syncope, vasovagal 02/01/2014   Palpitations 02/01/2014    PCP: Greta Doom, MD   REFERRING PROVIDER: Greta Doom, MD   REFERRING DIAG:  M54.9 (ICD-10-CM) - Dorsalgia, unspecified   Rationale for Evaluation and Treatment: Rehabilitation  THERAPY DIAG:  Dorsalgia of multiple sites in spine  Abnormal  posture  Muscle weakness-general  ONSET DATE: >10 yrs  SUBJECTIVE:                                                                                                                                                                                           SUBJECTIVE STATEMENT: The patient reports some soreness after 1st land based session immediately afterwards and the next day. She is accompanied by her mom  PERTINENT HISTORY:  10/30/21: Surgeon Barnett Abu     LUMBAR LAMINECTOMY/ DECOMPRESSION       POSTERIOR CERVICAL LAMINECTOMY  PAIN:  Are you having pain? Yes: NPRS scale: current 8/10; Pain location: generalized Pain description: N&T arms; right foot numbness, mid back to neck sharp Aggravating factors: walking, standing, showering, toileting Relieving factors: nothing right now; meds; ice packs  PRECAUTIONS: Cervical and Back  WEIGHT BEARING RESTRICTIONS: No  FALLS:  Has patient fallen in last 6 months? No  LIVING ENVIRONMENT: Lives with: lives with their family Lives in: House/apartment Stairs: No Has following equipment at home: Single point cane  OCCUPATION: disabled  PLOF: Needs assistance with ADLs  PATIENT GOALS: decrease pain, improve QOL, walk through grocery store  NEXT MD VISIT: TBA  OBJECTIVE:     PATIENT SURVEYS:  FOTO Primary score 28% with goal of 34% FOTO 03/25/23: 38%   SCREENING FOR RED FLAGS: No  COGNITION: Overall cognitive status: Within functional limits for tasks assessed     SENSATION: P&N throughout ue to hands and le through feet  MUSCLE LENGTH: Hamstrings: full knee extension in sitting   POSTURE: rounded, elevated shoulders, forward head, and flexed trunk   PALPATION: MS tightness and TTP throughout traps, post cervical spine, erector sp through lumbar spine   Cervical ROM: extension -10d; flex 40d; rotation R/L ~20d 04/27/23:  cervical rotation:  L 38, R 31;  Lateral flexion L 15, R 20   LUMBAR ROM:   AROM  eval 04/23/23  Flexion 25% full  Extension -20d -5d  Right lateral flexion 25%   Left lateral flexion 25%   Right rotation  ~20d  Left rotation  ~20d   (Blank rows = not tested)  LOWER Extremity  [facility]  LOWER EXTREMITY MMT:      Strength testing limited by pain MMT Right eval Left eval Right / Left 04/23/23 Right / Left 05/05/23  Hip flexion 3 3 3+/5 3+ -  4  Hip extension      Hip abduction 3 3 4/5 4/5  Hip adduction      Hip internal rotation      Hip external rotation      Knee flexion      Knee extension 3 3 4/5 4+/5  Ankle dorsiflexion      Ankle plantarflexion      Ankle inversion      Ankle eversion       (Blank rows = not tested)    FUNCTIONAL TESTS:  5 times sit to stand: unable to tolerate Timed up and go (TUG): 21.61 Berg Balance Scale: 22/56  04/02/23:  TUG 20.72s  04/23/23: Sharlene Motts: 32/56   5 x STS:26.24 with use of arms      05/05/23: TUG:19.41    05/14/23: TUG 16.06 sec;     5x STS (with use of arms) 24.26      GAIT: Distance walked: 200 Assistive device utilized: None Level of assistance: Modified independence Comments: Forward flex positioning, short step length, limited hip/knee flex with heel strike, slowed cadence, guarded  TODAY'S TREATMENT:  8/8 Nu-step 5 min cuing for a slow stretch   Ball press iso 2x10   Seated ball squeeze iso 3x10  Seated Biceps curl 3x10 1lb  Seated hip abduction 3x10 red   Reviewed how to work on seated march and supine march   Reviewed inflammatory foods vs anti-inflammatory       05/25/23 Pt seen for aquatic therapy today.  Treatment took place in water 3.5-4.75 ft in depth at the [facility] pool. Temp of water was 91.  Pt entered/exited the pool via stairs in step-to pattern with bilat rail.  *unsupported with arm swing:  walking forward and backward  in 4+ ft  x 2 *side  stepping rainbow ->rainbow hand floats with addct x 2 *alternating bow and arrow yellow hand floats,moving across width of pool backward x 2 laps * staggered stance with bilat horiz abdct/ addct with yellow hand floats x 10 each leg forward * tricep push downs with rainbow hand floats x 5, yellow hand floats x 10 * TrA set with solid noodle pull downs with isometric holds on return to surface x 8 wide stance then staggered * Tandem stance leading R/L initial skulling to steady, with time able to gain position and hold x 15s. SLS without UE support (skulling to steady) x 15 sec x 2 each LE (challenge!) * straddling yellow noodle UE on white barbell: cycling forward 3 laps   Pt requires the buoyancy and hydrostatic pressure of water for support, and to offload joints by unweighting joint load by at least 50 % in navel deep water and by at least 75-80% in chest to neck deep water.  Viscosity of the water is needed for resistance of strengthening. Water current perturbations provides challenge to standing balance requiring increased core activation.     Last visit: 8/1 Ball roll x5 5 sec hold  Lateral x5 5 sec hold   Reviewed self soft tissue mobilization using thera-cane and tennis ball.   Seated core stabilization  Hip abduction 2x12 red  Bilateral er 2x12 red   Therapy worked on TA breathing with patient. Patient has a tendency to hod her breath. We educated her on the trnasverse abdominal muscle and its function.     PATIENT EDUCATION:  Education details: posture awareness, aquatic therapy exercise modifications Person educated: Patient Education method: Explanation Education comprehension: verbalized understanding  HOME EXERCISE PROGRAM: Access Code: [email or web address] URL: [email or web address] Date: 03/16/2023 Prepared by: Geni Bers  Exercises - Seated Cervical Rotation AROM  - 1 x daily - 7 x weekly - 3 sets - 10 reps - Seated Cervical Extension AROM  - 1 x daily  - 7 x weekly - 3 sets - 10 reps - Seated Passive Cervical Retraction  - 1 x daily - 7 x weekly - 3 sets - 10 reps - Seated Scapular Retraction  - 1 x daily - 7 x weekly - 3 sets - 10 reps   [id number]  ASSESSMENT:  CLINICAL IMPRESSION: The patient tolerated treatment well. We focused on isometric exercises with core stability. She does well when she focuses on her breathing. We updated her HEP. She is supposed to have an intake with [facility]olds on Tuesday. She was advised to push it a week so we can get her into the gym to see how she does with gym activity.   Awaiting approval from [place] for additional visits; utilizing [employer] for visits after #15.      OBJECTIVE IMPAIRMENTS: Abnormal gait, decreased activity tolerance, decreased balance, decreased  endurance, decreased mobility, difficulty walking, decreased ROM, decreased strength, impaired sensation, improper body mechanics, postural dysfunction, obesity, and pain.   ACTIVITY LIMITATIONS: carrying, lifting, bending, sitting, standing, squatting, sleeping, stairs, transfers, bed mobility, bathing, toileting, reach over head, locomotion level, and caring for others  PARTICIPATION LIMITATIONS: meal prep, cleaning, laundry, driving, shopping, community activity, occupation, and yard work  PERSONAL FACTORS: Fitness, Past/current experiences, and Time since onset of injury/illness/exacerbation are also affecting patient's functional outcome.   REHAB POTENTIAL: Good  CLINICAL DECISION MAKING: Evolving/moderate complexity  EVALUATION COMPLEXITY: Moderate   GOALS: Goals reviewed with patient? Yes  SHORT TERM GOALS: Target date: 04/16/23  Pt will tolerate full aquatic sessions consistently without increase in pain and with improving function to demonstrate good toleration and effectiveness of intervention.   Baseline: Goal status: MET - 04/02/23  2.  Pt will improve on Tug test to <or=  15s to demonstrate improvement in lower extremity function,  mobility and decreased fall risk.  Baseline:21.61  Goal status: IN PROGRESS 04/14/23; 19.41 05/05/23 Ongoing  3.  Pt will obtain DME's as needed to improve safety with ADL's and amb Baseline: received shower chair, reacher, and rollator  Goal status: met-  05/10/23  4.  Pt will have a decrease in her minimal pain to </= 4/10 Baseline: 8/10 at eval;  4/10 while in water  Goal status: Met 05/18/23  5.  Pt will report completing showering without assist and apprehensiveness of falling Baseline: afraid of falling/not wanting to ask for help Goal status: Met 04/14/23    LONG TERM GOALS: Target date: 06/18/23  Pt to meet stated Foto Goal of 34% Baseline: 28% Goal status:MET - 03/25/23  2.  Pt will improve strength in all areas listed by  1 grade (MMT, plan to measure using HHD when tolerated)  to demonstrate improved overall physical function  Baseline: see chart Goal status: In Progress 05/05/23  3.  Pt will improve on Berg balance test to >/=  50/56 to demonstrate a decrease in fall risk. Baseline: 22/56 at eval; (32/56 on 04/23/23) Goal status: In progress   4.  Pt will tolerate walking to and from setting and engaging in aquatic therapy session without excessive fatigue or increase in pain to demonstrate improved toleration to activity.  Baseline: pain and multiple rest periods Goal status: Met 04/23/23  5.  Pt will be able to tolerate walking through grocery store without increase in pain and without seated rest period to demonstrate improved toleration to activity Baseline: unable (personal goal)-eval;  walked 15 min in [employer] with increase in pain 05/05/23 Goal status: ongoing   6.  Pt will have an overall decrease in max pain to </= 4/10 for improved QOL Baseline: 8/10 Goal status: In progress 04/23/23  PLAN:  PT FREQUENCY: 1-2x/week  PT DURATION: 8 weeks   PLANNED INTERVENTIONS: Therapeutic exercises, Therapeutic activity, Neuromuscular re-education, Balance training, Gait  training, Patient/Family education, Self Care, Joint mobilization, Stair training, Orthotic/Fit training, DME instructions, Aquatic Therapy, Dry Needling, Cryotherapy, Moist heat, Taping, Ionotophoresis 4mg /ml Dexamethasone, Manual therapy, and Re-evaluation.  PLAN FOR NEXT SESSION: aquatic intervention for general strengthening and mobility, stretching/ROM, pain reduction, posture correction, gait cervical through lumbar spine. Land: DN as approp upper traps; core/le strengthening; stair climbing; posture correction  Lorayne Bender PT DPT 05/27/23 12:24 PM [facility] [address] [place], [place], [address] Phone: [phone]   Fax:  [phone]

## 2023-05-31 ENCOUNTER — Ambulatory Visit (HOSPITAL_BASED_OUTPATIENT_CLINIC_OR_DEPARTMENT_OTHER): Payer: No Typology Code available for payment source | Admitting: Physical Therapy

## 2023-05-31 ENCOUNTER — Encounter (HOSPITAL_BASED_OUTPATIENT_CLINIC_OR_DEPARTMENT_OTHER): Payer: Self-pay | Admitting: Physical Therapy

## 2023-05-31 DIAGNOSIS — M549 Dorsalgia, unspecified: Secondary | ICD-10-CM | POA: Diagnosis not present

## 2023-05-31 DIAGNOSIS — M6281 Muscle weakness (generalized): Secondary | ICD-10-CM

## 2023-05-31 DIAGNOSIS — R293 Abnormal posture: Secondary | ICD-10-CM

## 2023-05-31 NOTE — Therapy (Signed)
OUTPATIENT PHYSICAL THERAPY [facility] TREATMENT  Department of [facility]  [id number] 15 visits approved from 12/18/22-04/17/23 [facility] Contact: Ardelia Mems [phone]   Patient Name: Toni Lewis MRN: [id number] DOB:08-24-1966, 57 y.o., female Today's Date: 05/31/2023  END OF SESSION:  PT End of Session - 05/31/23 1140     Visit Number 25    Number of Visits 28    Date for PT Re-Evaluation 06/18/23    Authorization Type [place] (for first 15); BCBS and [employer] (for visit 16 and beyond)    PT Start Time 1100    PT Stop Time 1138    PT Time Calculation (min) 38 min    Activity Tolerance Patient tolerated treatment well    Behavior During Therapy WFL for tasks assessed/performed                 Past Medical History:  Diagnosis Date   Anal fissure    Anxiety    Depression    Endometriosis    Headache    Hemorrhoids    Hypertension    Peptic ulcer disease    Past Surgical History:  Procedure Laterality Date   COLONOSCOPY  2022   ENDOMETRIAL ABLATION     LUMBAR LAMINECTOMY/ DECOMPRESSION WITH MET-RX Right 10/30/2021   Procedure: Right Lumbar Three-Four Lumbar Four-Five Laminotomy, Discectomy with metrx;  Surgeon: Barnett Abu, MD;  Location: [facility] OR;  Service: Neurosurgery;  Laterality: Right;   POSTERIOR CERVICAL LAMINECTOMY WITH MET- RX Bilateral 10/30/2021   Procedure: Bilateral Cervical Five-Six Foraminotomy w/Metrx;  Surgeon: Barnett Abu, MD;  Location: [facility] OR;  Service: Neurosurgery;  Laterality: Bilateral;   TONSILLECTOMY     Patient Active Problem List   Diagnosis Date Noted   Cervical disc disorder with radiculopathy of cervical region 10/30/2021   Benign essential HTN 02/01/2014   Chest pain 02/01/2014   Syncope, vasovagal 02/01/2014   Palpitations 02/01/2014    PCP: Greta Doom, MD   REFERRING PROVIDER: Greta Doom, MD   REFERRING DIAG:  M54.9 (ICD-10-CM) - Dorsalgia, unspecified   Rationale for Evaluation and Treatment:  Rehabilitation  THERAPY DIAG:  Dorsalgia of multiple sites in spine  Abnormal posture  Muscle weakness-general  ONSET DATE: >10 yrs  SUBJECTIVE:                                                                                                                                                                                           SUBJECTIVE STATEMENT: The patient reports her muscles were sroe after the last visit. She is otherwise doing OK.  PERTINENT HISTORY:  10/30/21: Surgeon Barnett Abu     LUMBAR LAMINECTOMY/  DECOMPRESSION       POSTERIOR CERVICAL LAMINECTOMY  PAIN:  Are you having pain? Yes: NPRS scale: current 8/10; Pain location: generalized Pain description: N&T arms; right foot numbness, mid back to neck sharp Aggravating factors: walking, standing, showering, toileting Relieving factors: nothing right now; meds; ice packs  PRECAUTIONS: Cervical and Back  WEIGHT BEARING RESTRICTIONS: No  FALLS:  Has patient fallen in last 6 months? No  LIVING ENVIRONMENT: Lives with: lives with their family Lives in: House/apartment Stairs: No Has following equipment at home: Single point cane  OCCUPATION: disabled  PLOF: Needs assistance with ADLs  PATIENT GOALS: decrease pain, improve QOL, walk through grocery store  NEXT MD VISIT: TBA  OBJECTIVE:     PATIENT SURVEYS:  FOTO Primary score 28% with goal of 34% FOTO 03/25/23: 38%   SCREENING FOR RED FLAGS: No  COGNITION: Overall cognitive status: Within functional limits for tasks assessed     SENSATION: P&N throughout ue to hands and le through feet  MUSCLE LENGTH: Hamstrings: full knee extension in sitting   POSTURE: rounded, elevated shoulders, forward head, and flexed trunk   PALPATION: MS tightness and TTP throughout traps, post cervical spine, erector sp through lumbar spine   Cervical ROM: extension -10d; flex 40d; rotation R/L ~20d 04/27/23:  cervical rotation:  L 38, R 31;  Lateral flexion L  15, R 20   LUMBAR ROM:   AROM eval 04/23/23  Flexion 25% full  Extension -20d -5d  Right lateral flexion 25%   Left lateral flexion 25%   Right rotation  ~20d  Left rotation  ~20d   (Blank rows = not tested)  LOWER Extremity  [facility]  LOWER EXTREMITY MMT:      Strength testing limited by pain MMT Right eval Left eval Right / Left 04/23/23 Right / Left 05/05/23  Hip flexion 3 3 3+/5 3+ -  4  Hip extension      Hip abduction 3 3 4/5 4/5  Hip adduction      Hip internal rotation      Hip external rotation      Knee flexion      Knee extension 3 3 4/5 4+/5  Ankle dorsiflexion      Ankle plantarflexion      Ankle inversion      Ankle eversion       (Blank rows = not tested)    FUNCTIONAL TESTS:  5 times sit to stand: unable to tolerate Timed up and go (TUG): 21.61 Berg Balance Scale: 22/56  04/02/23:  TUG 20.72s  04/23/23: Sharlene Motts: 32/56   5 x STS:26.24 with use of arms      05/05/23: TUG:19.41    05/14/23: TUG 16.06 sec;     5x STS (with use of arms) 24.26      GAIT: Distance walked: 200 Assistive device utilized: None Level of assistance: Modified independence Comments: Forward flex positioning, short step length, limited hip/knee flex with heel strike, slowed cadence, guarded  TODAY'S TREATMENT:  8/12 Nu-step 5 min cuing for a slow stretch   LF Knee extension machine 3x10 10 lbs  LF hip abduction 25 3x10  lbs  LF Row 3x10 20 lbs  LF Triceps 3x15 25lbs   Reviewed set up of all machines and how to use core breathing. We reviewed machines she should avoind. She was given a sheet with her exercises.   8/8 Nu-step 5 min cuing for a slow stretch   Ball press iso 2x10   Seated ball squeeze iso 3x10  Seated Biceps curl 3x10 1lb  Seated hip abduction 3x10 red   Reviewed how to work on seated march and supine march   Reviewed  inflammatory foods vs anti-inflammatory       05/25/23 Pt seen for aquatic therapy today.  Treatment took place in water 3.5-4.75 ft in depth at the [facility] pool. Temp of water was 91.  Pt entered/exited the pool via stairs in step-to pattern with bilat rail.  *unsupported with arm swing:  walking forward and backward  in 4+ ft  x 2 *side stepping rainbow ->rainbow hand floats with addct x 2 *alternating bow and arrow yellow hand floats,moving across width of pool backward x 2 laps * staggered stance with bilat horiz abdct/ addct with yellow hand floats x 10 each leg forward * tricep push downs with rainbow hand floats x 5, yellow hand floats x 10 * TrA set with solid noodle pull downs with isometric holds on return to surface x 8 wide stance then staggered * Tandem stance leading R/L initial skulling to steady, with time able to gain position and hold x 15s. SLS without UE support (skulling to steady) x 15 sec x 2 each LE (challenge!) * straddling yellow noodle UE on white barbell: cycling forward 3 laps   Pt requires the buoyancy and hydrostatic pressure of water for support, and to offload joints by unweighting joint load by at least 50 % in navel deep water and by at least 75-80% in chest to neck deep water.  Viscosity of the water is needed for resistance of strengthening. Water current perturbations provides challenge to standing balance requiring increased core activation.     Last visit: 8/1 Ball roll x5 5 sec hold  Lateral x5 5 sec hold   Reviewed self soft tissue mobilization using thera-cane and tennis ball.   Seated core stabilization  Hip abduction 2x12 red  Bilateral er 2x12 red   Therapy worked on TA breathing with patient. Patient has a tendency to hod her breath. We educated her on the trnasverse abdominal muscle and its function.     PATIENT EDUCATION:  Education details: posture awareness, aquatic therapy exercise modifications Person educated:  Patient Education method: Explanation Education comprehension: verbalized understanding  HOME EXERCISE PROGRAM: Access Code: [email or web address] URL: [email or web address] Date: 03/16/2023 Prepared by: Geni Bers  Exercises - Seated Cervical Rotation AROM  - 1 x daily - 7 x weekly - 3 sets - 10 reps - Seated Cervical Extension AROM  - 1 x daily - 7 x weekly - 3 sets - 10 reps - Seated Passive Cervical Retraction  - 1 x daily - 7 x weekly - 3 sets - 10 reps - Seated Scapular Retraction  - 1 x daily - 7 x weekly - 3 sets - 10 reps   [id number]  ASSESSMENT:  CLINICAL IMPRESSION: Therapy was able to start he patient in the gym today. She has an appointment for an intake with [facility]. We will get  her comfortable on the equipment first. She was given 2 UE and 2 lower extremity exercises to work on. She was given her reps and weights to I've to there person at [facility]. She was advised of the normal response to exercises. She did will with her core breathing. We will continue to advance as tolerated.  Awaiting approval from [place] for additional visits; utilizing BCBS for visits after #15.      OBJECTIVE IMPAIRMENTS: Abnormal gait, decreased activity tolerance, decreased balance, decreased endurance, decreased mobility, difficulty walking, decreased ROM, decreased strength, impaired sensation, improper body mechanics, postural dysfunction, obesity, and pain.   ACTIVITY LIMITATIONS: carrying, lifting, bending, sitting, standing, squatting, sleeping, stairs, transfers, bed mobility, bathing, toileting, reach over head, locomotion level, and caring for others  PARTICIPATION LIMITATIONS: meal prep, cleaning, laundry, driving, shopping, community activity, occupation, and yard work  PERSONAL FACTORS: Fitness, Past/current experiences, and Time since onset of injury/illness/exacerbation are also affecting patient's functional outcome.   REHAB POTENTIAL: Good  CLINICAL DECISION MAKING:  Evolving/moderate complexity  EVALUATION COMPLEXITY: Moderate   GOALS: Goals reviewed with patient? Yes  SHORT TERM GOALS: Target date: 04/16/23  Pt will tolerate full aquatic sessions consistently without increase in pain and with improving function to demonstrate good toleration and effectiveness of intervention.   Baseline: Goal status: MET - 04/02/23  2.  Pt will improve on Tug test to <or=  15s to demonstrate improvement in lower extremity function, mobility and decreased fall risk.  Baseline:21.61  Goal status: IN PROGRESS 04/14/23; 19.41 05/05/23 Ongoing  3.  Pt will obtain DME's as needed to improve safety with ADL's and amb Baseline: received shower chair, reacher, and rollator  Goal status: met-  05/10/23  4.  Pt will have a decrease in her minimal pain to </= 4/10 Baseline: 8/10 at eval;  4/10 while in water  Goal status: Met 05/18/23  5.  Pt will report completing showering without assist and apprehensiveness of falling Baseline: afraid of falling/not wanting to ask for help Goal status: Met 04/14/23    LONG TERM GOALS: Target date: 06/18/23  Pt to meet stated Foto Goal of 34% Baseline: 28% Goal status:MET - 03/25/23  2.  Pt will improve strength in all areas listed by  1 grade (MMT, plan to measure using HHD when tolerated)  to demonstrate improved overall physical function  Baseline: see chart Goal status: In Progress 05/05/23  3.  Pt will improve on Berg balance test to >/=  50/56 to demonstrate a decrease in fall risk. Baseline: 22/56 at eval; (32/56 on 04/23/23) Goal status: In progress   4.  Pt will tolerate walking to and from setting and engaging in aquatic therapy session without excessive fatigue or increase in pain to demonstrate improved toleration to activity.  Baseline: pain and multiple rest periods Goal status: Met 04/23/23  5.  Pt will be able to tolerate walking through grocery store without increase in pain and without seated rest period to  demonstrate improved toleration to activity Baseline: unable (personal goal)-eval;  walked 15 min in [employer] with increase in pain 05/05/23 Goal status: ongoing   6.  Pt will have an overall decrease in max pain to </= 4/10 for improved QOL Baseline: 8/10 Goal status: In progress 04/23/23  PLAN:  PT FREQUENCY: 1-2x/week  PT DURATION: 8 weeks   PLANNED INTERVENTIONS: Therapeutic exercises, Therapeutic activity, Neuromuscular re-education, Balance training, Gait training, Patient/Family education, Self Care, Joint mobilization, Stair training, Orthotic/Fit training, DME instructions, Aquatic Therapy, Dry Needling, Cryotherapy, Moist  heat, Taping, Ionotophoresis 4mg /ml Dexamethasone, Manual therapy, and Re-evaluation.  PLAN FOR NEXT SESSION: aquatic intervention for general strengthening and mobility, stretching/ROM, pain reduction, posture correction, gait cervical through lumbar spine. Land: DN as approp upper traps; core/le strengthening; stair climbing; posture correction  Lorayne Bender PT DPT 05/31/23 12:00 PM [facility] [address] [place], [place], [address] Phone: [phone]   Fax:  [phone]

## 2023-06-03 ENCOUNTER — Ambulatory Visit (HOSPITAL_BASED_OUTPATIENT_CLINIC_OR_DEPARTMENT_OTHER): Payer: No Typology Code available for payment source | Admitting: Physical Therapy

## 2023-06-03 ENCOUNTER — Encounter (HOSPITAL_BASED_OUTPATIENT_CLINIC_OR_DEPARTMENT_OTHER): Payer: Self-pay | Admitting: Physical Therapy

## 2023-06-03 DIAGNOSIS — M549 Dorsalgia, unspecified: Secondary | ICD-10-CM

## 2023-06-03 DIAGNOSIS — M6281 Muscle weakness (generalized): Secondary | ICD-10-CM

## 2023-06-03 DIAGNOSIS — R293 Abnormal posture: Secondary | ICD-10-CM

## 2023-06-03 NOTE — Therapy (Signed)
OUTPATIENT PHYSICAL THERAPY [facility] TREATMENT  Department of [facility]  [id number] 15 visits approved from 12/18/22-04/17/23 [facility] Contact: Ardelia Mems [phone]   Patient Name: Toni Lewis MRN: [id number] DOB:1966/04/30, 57 y.o., female Today's Date: 06/03/2023  END OF SESSION:  PT End of Session - 06/03/23 0943     Visit Number 26    Number of Visits 28    Date for PT Re-Evaluation 06/18/23    Authorization Type VA (for first 15); BCBS and [employer] (for visit 16 and beyond)    PT Start Time 0945    PT Stop Time 1030    PT Time Calculation (min) 45 min    Activity Tolerance Patient tolerated treatment well    Behavior During Therapy [facility] for tasks assessed/performed                 Past Medical History:  Diagnosis Date   Anal fissure    Anxiety    Depression    Endometriosis    Headache    Hemorrhoids    Hypertension    Peptic ulcer disease    Past Surgical History:  Procedure Laterality Date   COLONOSCOPY  2022   ENDOMETRIAL ABLATION     LUMBAR LAMINECTOMY/ DECOMPRESSION WITH MET-RX Right 10/30/2021   Procedure: Right Lumbar Three-Four Lumbar Four-Five Laminotomy, Discectomy with metrx;  Surgeon: Barnett Abu, MD;  Location: [facility] OR;  Service: Neurosurgery;  Laterality: Right;   POSTERIOR CERVICAL LAMINECTOMY WITH MET- RX Bilateral 10/30/2021   Procedure: Bilateral Cervical Five-Six Foraminotomy w/Metrx;  Surgeon: Barnett Abu, MD;  Location: [facility] OR;  Service: Neurosurgery;  Laterality: Bilateral;   TONSILLECTOMY     Patient Active Problem List   Diagnosis Date Noted   Cervical disc disorder with radiculopathy of cervical region 10/30/2021   Benign essential HTN 02/01/2014   Chest pain 02/01/2014   Syncope, vasovagal 02/01/2014   Palpitations 02/01/2014    PCP: Greta Doom, MD   REFERRING PROVIDER: Greta Doom, MD   REFERRING DIAG:  M54.9 (ICD-10-CM) - Dorsalgia, unspecified   Rationale for Evaluation and Treatment:  Rehabilitation  THERAPY DIAG:  Dorsalgia of multiple sites in spine  Abnormal posture  Muscle weakness-general  ONSET DATE: >10 yrs  SUBJECTIVE:                                                                                                                                                                                           SUBJECTIVE STATEMENT: The patient reports going to [employer] and starting membership PERTINENT HISTORY:  10/30/21: Surgeon Barnett Abu     LUMBAR LAMINECTOMY/ DECOMPRESSION  POSTERIOR CERVICAL LAMINECTOMY  PAIN:  Are you having pain? Yes: NPRS scale: current 8/10; Pain location: generalized Pain description: N&T arms; right foot numbness, mid back to neck sharp Aggravating factors: walking, standing, showering, toileting Relieving factors: nothing right now; meds; ice packs  PRECAUTIONS: Cervical and Back  WEIGHT BEARING RESTRICTIONS: No  FALLS:  Has patient fallen in last 6 months? No  LIVING ENVIRONMENT: Lives with: lives with their family Lives in: House/apartment Stairs: No Has following equipment at home: Single point cane  OCCUPATION: disabled  PLOF: Needs assistance with ADLs  PATIENT GOALS: decrease pain, improve QOL, walk through grocery store  NEXT MD VISIT: TBA  OBJECTIVE:     PATIENT SURVEYS:  FOTO Primary score 28% with goal of 34% FOTO 03/25/23: 38%   SCREENING FOR RED FLAGS: No  COGNITION: Overall cognitive status: Within functional limits for tasks assessed     SENSATION: P&N throughout ue to hands and le through feet  MUSCLE LENGTH: Hamstrings: full knee extension in sitting   POSTURE: rounded, elevated shoulders, forward head, and flexed trunk   PALPATION: MS tightness and TTP throughout traps, post cervical spine, erector sp through lumbar spine   Cervical ROM: extension -10d; flex 40d; rotation R/L ~20d 04/27/23:  cervical rotation:  L 38, R 31;  Lateral flexion L 15, R 20   LUMBAR ROM:    AROM eval 04/23/23  Flexion 25% full  Extension -20d -5d  Right lateral flexion 25%   Left lateral flexion 25%   Right rotation  ~20d  Left rotation  ~20d   (Blank rows = not tested)  LOWER Extremity  [facility]  LOWER EXTREMITY MMT:      Strength testing limited by pain MMT Right eval Left eval Right / Left 04/23/23 Right / Left 05/05/23  Hip flexion 3 3 3+/5 3+ -  4  Hip extension      Hip abduction 3 3 4/5 4/5  Hip adduction      Hip internal rotation      Hip external rotation      Knee flexion      Knee extension 3 3 4/5 4+/5  Ankle dorsiflexion      Ankle plantarflexion      Ankle inversion      Ankle eversion       (Blank rows = not tested)    FUNCTIONAL TESTS:  5 times sit to stand: unable to tolerate Timed up and go (TUG): 21.61 Berg Balance Scale: 22/56  04/02/23:  TUG 20.72s  04/23/23: Sharlene Motts: 32/56   5 x STS:26.24 with use of arms      05/05/23: TUG:19.41    05/14/23: TUG 16.06 sec;     5x STS (with use of arms) 24.26      GAIT: Distance walked: 200 Assistive device utilized: None Level of assistance: Modified independence Comments: Forward flex positioning, short step length, limited hip/knee flex with heel strike, slowed cadence, guarded  TODAY'S TREATMENT:  05/25/23 Pt seen for aquatic therapy today.  Treatment took place in water 3.5-4.75 ft in depth at the [facility] pool. Temp of water was 91.  Pt entered/exited the pool via stairs in step-to pattern with bilat rail.    Exercises - Walking   - Side Stepping - Drawing Bow and arrow - Standing Shoulder Horizontal Abduction with Resistance   - triceps press - Noodle press  - Forward Backward Tandem Walking   - Single Leg Stance  - Seated Straddle on Flotation Forward Breast Stroke Arms and Bicycle Legs   Pt requires the buoyancy and hydrostatic pressure of water  for support, and to offload joints by unweighting joint load by at least 50 % in navel deep water and by at least 75-80% in chest to neck deep water.  Viscosity of the water is needed for resistance of strengthening. Water current perturbations provides challenge to standing balance requiring increased core activation. Last session: 8/12 Nu-step 5 min cuing for a slow stretch   LF Knee extension machine 3x10 10 lbs  LF hip abduction 25 3x10  lbs  LF Row 3x10 20 lbs  LF Triceps 3x15 25lbs   Reviewed set up of all machines and how to use core breathing. We reviewed machines she should avoind. She was given a sheet with her exercises.      PATIENT EDUCATION:  Education details: posture awareness, aquatic therapy exercise modifications Person educated: Patient Education method: Explanation Education comprehension: verbalized understanding  HOME EXERCISE PROGRAM: Access Code: [email or web address] URL: [email or web address] Date: 03/16/2023 Prepared by: Geni Bers  Exercises - Seated Cervical Rotation AROM  - 1 x daily - 7 x weekly - 3 sets - 10 reps - Seated Cervical Extension AROM  - 1 x daily - 7 x weekly - 3 sets - 10 reps - Seated Passive Cervical Retraction  - 1 x daily - 7 x weekly - 3 sets - 10 reps - Seated Scapular Retraction  - 1 x daily - 7 x weekly - 3 sets - 10 reps   [id number]/Land Illene Labrador  ASSESSMENT:  CLINICAL IMPRESSION: Pt instructed on final aquatic HEP. She is issued laminated copy after instruction and completion. She is given minor cues and clarification both verbal and written on program to ensure indep.  She has two sessions remaining and will both be completed land based to allow sufficient time for instruction on approp machines to use when in [employer].  She has progressed well in aquatic setting and has reached her max potential.  She has majority of exercises on HEP memorized.  Awaiting approval from [place] for additional visits; utilizing BCBS  for visits after #15.      OBJECTIVE IMPAIRMENTS: Abnormal gait, decreased activity tolerance, decreased balance, decreased endurance, decreased mobility, difficulty walking, decreased ROM, decreased strength, impaired sensation, improper body mechanics, postural dysfunction, obesity, and pain.   ACTIVITY LIMITATIONS: carrying, lifting, bending, sitting, standing, squatting, sleeping, stairs, transfers, bed mobility, bathing, toileting, reach over head, locomotion level, and caring for others  PARTICIPATION LIMITATIONS: meal prep, cleaning, laundry, driving, shopping, community activity, occupation, and yard work  PERSONAL FACTORS: Fitness, Past/current experiences, and Time since onset of injury/illness/exacerbation are also affecting patient's functional outcome.   REHAB POTENTIAL: Good  CLINICAL DECISION MAKING: Evolving/moderate complexity  EVALUATION COMPLEXITY: Moderate   GOALS: Goals reviewed with patient? Yes  SHORT TERM GOALS: Target date: 04/16/23  Pt will tolerate full aquatic sessions consistently without increase in pain and with improving function to demonstrate good toleration  and effectiveness of intervention.   Baseline: Goal status: MET - 04/02/23  2.  Pt will improve on Tug test to <or=  15s to demonstrate improvement in lower extremity function, mobility and decreased fall risk.  Baseline:21.61  Goal status: IN PROGRESS 04/14/23; 19.41 05/05/23 Ongoing  3.  Pt will obtain DME's as needed to improve safety with ADL's and amb Baseline: received shower chair, reacher, and rollator  Goal status: met-  05/10/23  4.  Pt will have a decrease in her minimal pain to </= 4/10 Baseline: 8/10 at eval;  4/10 while in water  Goal status: Met 05/18/23  5.  Pt will report completing showering without assist and apprehensiveness of falling Baseline: afraid of falling/not wanting to ask for help Goal status: Met 04/14/23    LONG TERM GOALS: Target date: 06/18/23  Pt to meet  stated Foto Goal of 34% Baseline: 28% Goal status:MET - 03/25/23  2.  Pt will improve strength in all areas listed by  1 grade (MMT, plan to measure using HHD when tolerated)  to demonstrate improved overall physical function  Baseline: see chart Goal status: In Progress 05/05/23  3.  Pt will improve on Berg balance test to >/=  50/56 to demonstrate a decrease in fall risk. Baseline: 22/56 at eval; (32/56 on 04/23/23) Goal status: In progress   4.  Pt will tolerate walking to and from setting and engaging in aquatic therapy session without excessive fatigue or increase in pain to demonstrate improved toleration to activity.  Baseline: pain and multiple rest periods Goal status: Met 04/23/23  5.  Pt will be able to tolerate walking through grocery store without increase in pain and without seated rest period to demonstrate improved toleration to activity Baseline: unable (personal goal)-eval;  walked 15 min in [employer] with increase in pain 05/05/23 Goal status: ongoing   6.  Pt will have an overall decrease in max pain to </= 4/10 for improved QOL Baseline: 8/10 Goal status: In progress 04/23/23  PLAN:  PT FREQUENCY: 1-2x/week  PT DURATION: 8 weeks   PLANNED INTERVENTIONS: Therapeutic exercises, Therapeutic activity, Neuromuscular re-education, Balance training, Gait training, Patient/Family education, Self Care, Joint mobilization, Stair training, Orthotic/Fit training, DME instructions, Aquatic Therapy, Dry Needling, Cryotherapy, Moist heat, Taping, Ionotophoresis 4mg /ml Dexamethasone, Manual therapy, and Re-evaluation.  PLAN FOR NEXT SESSION: aquatic intervention for general strengthening and mobility, stretching/ROM, pain reduction, posture correction, gait cervical through lumbar spine. Land: DN as approp upper traps; core/le strengthening; stair climbing; posture correction  Rushie Chestnut) Timithy Arons MPT 06/03/23 1:46 PM [facility] [address] [place], [place], [address] Phone: [phone]   Fax:  [phone]

## 2023-06-08 ENCOUNTER — Encounter (HOSPITAL_BASED_OUTPATIENT_CLINIC_OR_DEPARTMENT_OTHER): Payer: Self-pay | Admitting: Physical Therapy

## 2023-06-08 ENCOUNTER — Ambulatory Visit (HOSPITAL_BASED_OUTPATIENT_CLINIC_OR_DEPARTMENT_OTHER): Payer: No Typology Code available for payment source | Admitting: Physical Therapy

## 2023-06-08 DIAGNOSIS — M6281 Muscle weakness (generalized): Secondary | ICD-10-CM

## 2023-06-08 DIAGNOSIS — R293 Abnormal posture: Secondary | ICD-10-CM

## 2023-06-08 DIAGNOSIS — M549 Dorsalgia, unspecified: Secondary | ICD-10-CM

## 2023-06-08 NOTE — Therapy (Signed)
OUTPATIENT PHYSICAL THERAPY [facility] TREATMENT  Department of [facility]  [id number] 15 visits approved from 12/18/22-04/17/23 [facility] Contact: Ardelia Mems [phone]   Patient Name: Toni Lewis MRN: [id number] DOB:09-06-1966, 57 y.o., female Today's Date: 06/08/2023  END OF SESSION:  PT End of Session - 06/08/23 1257     Visit Number 27    Number of Visits 28    Date for PT Re-Evaluation 06/18/23    Authorization Type VA (for first 15); BCBS and CareGuard (for visit 16 and beyond)    PT Start Time 1100    PT Stop Time 1143    PT Time Calculation (min) 43 min    Activity Tolerance Patient tolerated treatment well    Behavior During Therapy WFL for tasks assessed/performed                  Past Medical History:  Diagnosis Date   Anal fissure    Anxiety    Depression    Endometriosis    Headache    Hemorrhoids    Hypertension    Peptic ulcer disease    Past Surgical History:  Procedure Laterality Date   COLONOSCOPY  2022   ENDOMETRIAL ABLATION     LUMBAR LAMINECTOMY/ DECOMPRESSION WITH MET-RX Right 10/30/2021   Procedure: Right Lumbar Three-Four Lumbar Four-Five Laminotomy, Discectomy with metrx;  Surgeon: Barnett Abu, MD;  Location: [facility] OR;  Service: Neurosurgery;  Laterality: Right;   POSTERIOR CERVICAL LAMINECTOMY WITH MET- RX Bilateral 10/30/2021   Procedure: Bilateral Cervical Five-Six Foraminotomy w/Metrx;  Surgeon: Barnett Abu, MD;  Location: [facility] OR;  Service: Neurosurgery;  Laterality: Bilateral;   TONSILLECTOMY     Patient Active Problem List   Diagnosis Date Noted   Cervical disc disorder with radiculopathy of cervical region 10/30/2021   Benign essential HTN 02/01/2014   Chest pain 02/01/2014   Syncope, vasovagal 02/01/2014   Palpitations 02/01/2014    PCP: Greta Doom, MD   REFERRING PROVIDER: Greta Doom, MD   REFERRING DIAG:  M54.9 (ICD-10-CM) - Dorsalgia, unspecified   Rationale for Evaluation and Treatment:  Rehabilitation  THERAPY DIAG:  Dorsalgia of multiple sites in spine  Abnormal posture  Muscle weakness-general  ONSET DATE: >10 yrs  SUBJECTIVE:                                                                                                                                                                                           SUBJECTIVE STATEMENT: The patient reports she has been a little sore but otherwise she is doing well. She has finished with aquatics. She does feel like her balance is off slightly.  PERTINENT HISTORY:  10/30/21: Surgeon Barnett Abu     LUMBAR LAMINECTOMY/ DECOMPRESSION       POSTERIOR CERVICAL LAMINECTOMY  PAIN:  Are you having pain? Yes: NPRS scale: current 8/10; Pain location: generalized Pain description: N&T arms; right foot numbness, mid back to neck sharp Aggravating factors: walking, standing, showering, toileting Relieving factors: nothing right now; meds; ice packs  PRECAUTIONS: Cervical and Back  WEIGHT BEARING RESTRICTIONS: No  FALLS:  Has patient fallen in last 6 months? No  LIVING ENVIRONMENT: Lives with: lives with their family Lives in: House/apartment Stairs: No Has following equipment at home: Single point cane  OCCUPATION: disabled  PLOF: Needs assistance with ADLs  PATIENT GOALS: decrease pain, improve QOL, walk through grocery store  NEXT MD VISIT: TBA  OBJECTIVE:     PATIENT SURVEYS:  FOTO Primary score 28% with goal of 34% FOTO 03/25/23: 38%   SCREENING FOR RED FLAGS: No  COGNITION: Overall cognitive status: Within functional limits for tasks assessed     SENSATION: P&N throughout ue to hands and le through feet  MUSCLE LENGTH: Hamstrings: full knee extension in sitting   POSTURE: rounded, elevated shoulders, forward head, and flexed trunk   PALPATION: MS tightness and TTP throughout traps, post cervical spine, erector sp through lumbar spine   Cervical ROM: extension -10d; flex 40d; rotation  R/L ~20d 04/27/23:  cervical rotation:  L 38, R 31;  Lateral flexion L 15, R 20   LUMBAR ROM:   AROM eval 04/23/23  Flexion 25% full  Extension -20d -5d  Right lateral flexion 25%   Left lateral flexion 25%   Right rotation  ~20d  Left rotation  ~20d   (Blank rows = not tested)  LOWER Extremity  [facility]  LOWER EXTREMITY MMT:      Strength testing limited by pain MMT Right eval Left eval Right / Left 04/23/23 Right / Left 05/05/23  Hip flexion 3 3 3+/5 3+ -  4  Hip extension      Hip abduction 3 3 4/5 4/5  Hip adduction      Hip internal rotation      Hip external rotation      Knee flexion      Knee extension 3 3 4/5 4+/5  Ankle dorsiflexion      Ankle plantarflexion      Ankle inversion      Ankle eversion       (Blank rows = not tested)    FUNCTIONAL TESTS:  5 times sit to stand: unable to tolerate Timed up and go (TUG): 21.61 Berg Balance Scale: 22/56  04/02/23:  TUG 20.72s  04/23/23: Sharlene Motts: 32/56   5 x STS:26.24 with use of arms      05/05/23: TUG:19.41    05/14/23: TUG 16.06 sec;     5x STS (with use of arms) 24.26      GAIT: Distance walked: 200 Assistive device utilized: None Level of assistance: Modified independence Comments: Forward flex positioning, short step length, limited hip/knee flex with heel strike, slowed cadence, guarded  TODAY'S TREATMENT:  8/20 Nu-step 5 min    Standing cable row 10 lbs 3x10  Standing cable extension 10lbs 3x10   All with breathing for TA activation    Narrow base eyes open 3x30 sec hold CGA  NB EC 1x20 sec hold min a   Tandem stance 2x30 sec bilateral  Heel/toe rock with UE support 2x10    Updated HEP.    05/25/23 Pt seen for aquatic therapy today.  Treatment took place in water 3.5-4.75 ft in depth at the [facility] pool. Temp of water was 91.  Pt entered/exited the pool via  stairs in step-to pattern with bilat rail.    Exercises - Walking   - Side Stepping - Drawing Bow and arrow - Standing Shoulder Horizontal Abduction with Resistance   - triceps press - Noodle press  - Forward Backward Tandem Walking   - Single Leg Stance  - Seated Straddle on Flotation Forward Breast Stroke Arms and Bicycle Legs   Pt requires the buoyancy and hydrostatic pressure of water for support, and to offload joints by unweighting joint load by at least 50 % in navel deep water and by at least 75-80% in chest to neck deep water.  Viscosity of the water is needed for resistance of strengthening. Water current perturbations provides challenge to standing balance requiring increased core activation. Last session: 8/12 Nu-step 5 min cuing for a slow stretch   LF Knee extension machine 3x10 10 lbs  LF hip abduction 25 3x10  lbs  LF Row 3x10 20 lbs  LF Triceps 3x15 25lbs   Reviewed set up of all machines and how to use core breathing. We reviewed machines she should avoind. She was given a sheet with her exercises.      PATIENT EDUCATION:  Education details: posture awareness, aquatic therapy exercise modifications Person educated: Patient Education method: Explanation Education comprehension: verbalized understanding  HOME EXERCISE PROGRAM: Access Code: [email or web address] URL: [email or web address] Date: 03/16/2023 Prepared by: Geni Bers  Exercises - Seated Cervical Rotation AROM  - 1 x daily - 7 x weekly - 3 sets - 10 reps - Seated Cervical Extension AROM  - 1 x daily - 7 x weekly - 3 sets - 10 reps - Seated Passive Cervical Retraction  - 1 x daily - 7 x weekly - 3 sets - 10 reps - Seated Scapular Retraction  - 1 x daily - 7 x weekly - 3 sets - 10 reps   [id number]/Land Illene Labrador  ASSESSMENT:  CLINICAL IMPRESSION: The patient worked on balance activity today. She was given an HEP on things to work on at home. We worked on safety with balance  activity. She required CGA with narrow base and tandem stance. She required mod a with eyes closed activity. We also reviewed use of the air-ex. She was encouraged to go to the gym and start working. We reviewed cable exercises in the gym. We will continue to progress as tolerated.     Awaiting approval from [place] for additional visits; utilizing BCBS for visits after #15.      OBJECTIVE IMPAIRMENTS: Abnormal gait, decreased activity tolerance, decreased balance, decreased endurance, decreased mobility, difficulty walking, decreased ROM, decreased strength, impaired sensation, improper body mechanics, postural dysfunction, obesity, and pain.   ACTIVITY LIMITATIONS: carrying, lifting, bending, sitting, standing, squatting, sleeping, stairs, transfers, bed mobility, bathing, toileting, reach over head, locomotion level, and caring for others  PARTICIPATION LIMITATIONS: meal prep, cleaning, laundry, driving, shopping, community activity, occupation, and yard work  PERSONAL FACTORS: Fitness,  Past/current experiences, and Time since onset of injury/illness/exacerbation are also affecting patient's functional outcome.   REHAB POTENTIAL: Good  CLINICAL DECISION MAKING: Evolving/moderate complexity  EVALUATION COMPLEXITY: Moderate   GOALS: Goals reviewed with patient? Yes  SHORT TERM GOALS: Target date: 04/16/23  Pt will tolerate full aquatic sessions consistently without increase in pain and with improving function to demonstrate good toleration and effectiveness of intervention.   Baseline: Goal status: MET - 04/02/23  2.  Pt will improve on Tug test to <or=  15s to demonstrate improvement in lower extremity function, mobility and decreased fall risk.  Baseline:21.61  Goal status: IN PROGRESS 04/14/23; 19.41 05/05/23 Ongoing  3.  Pt will obtain DME's as needed to improve safety with ADL's and amb Baseline: received shower chair, reacher, and rollator  Goal status: met-  05/10/23  4.  Pt  will have a decrease in her minimal pain to </= 4/10 Baseline: 8/10 at eval;  4/10 while in water  Goal status: Met 05/18/23  5.  Pt will report completing showering without assist and apprehensiveness of falling Baseline: afraid of falling/not wanting to ask for help Goal status: Met 04/14/23    LONG TERM GOALS: Target date: 06/18/23  Pt to meet stated Foto Goal of 34% Baseline: 28% Goal status:MET - 03/25/23  2.  Pt will improve strength in all areas listed by  1 grade (MMT, plan to measure using HHD when tolerated)  to demonstrate improved overall physical function  Baseline: see chart Goal status: In Progress 05/05/23  3.  Pt will improve on Berg balance test to >/=  50/56 to demonstrate a decrease in fall risk. Baseline: 22/56 at eval; (32/56 on 04/23/23) Goal status: In progress   4.  Pt will tolerate walking to and from setting and engaging in aquatic therapy session without excessive fatigue or increase in pain to demonstrate improved toleration to activity.  Baseline: pain and multiple rest periods Goal status: Met 04/23/23  5.  Pt will be able to tolerate walking through grocery store without increase in pain and without seated rest period to demonstrate improved toleration to activity Baseline: unable (personal goal)-eval;  walked 15 min in [employer] with increase in pain 05/05/23 Goal status: ongoing   6.  Pt will have an overall decrease in max pain to </= 4/10 for improved QOL Baseline: 8/10 Goal status: In progress 04/23/23  PLAN:  PT FREQUENCY: 1-2x/week  PT DURATION: 8 Lewis   PLANNED INTERVENTIONS: Therapeutic exercises, Therapeutic activity, Neuromuscular re-education, Balance training, Gait training, Patient/Family education, Self Care, Joint mobilization, Stair training, Orthotic/Fit training, DME instructions, Aquatic Therapy, Dry Needling, Cryotherapy, Moist heat, Taping, Ionotophoresis 4mg /ml Dexamethasone, Manual therapy, and Re-evaluation.  PLAN FOR NEXT SESSION:  aquatic intervention for general strengthening and mobility, stretching/ROM, pain reduction, posture correction, gait cervical through lumbar spine. Land: DN as approp upper traps; core/le strengthening; stair climbing; posture correction   06/08/23 1:22 PM [facility] [address] [place], [place], [address] Phone: [phone]   Fax:  [phone]

## 2023-06-11 ENCOUNTER — Ambulatory Visit (HOSPITAL_BASED_OUTPATIENT_CLINIC_OR_DEPARTMENT_OTHER): Payer: BC Managed Care – PPO | Admitting: Physical Therapy

## 2023-06-17 ENCOUNTER — Ambulatory Visit (HOSPITAL_BASED_OUTPATIENT_CLINIC_OR_DEPARTMENT_OTHER): Payer: No Typology Code available for payment source | Admitting: Physical Therapy

## 2023-06-17 ENCOUNTER — Encounter (HOSPITAL_BASED_OUTPATIENT_CLINIC_OR_DEPARTMENT_OTHER): Payer: Self-pay | Admitting: Physical Therapy

## 2023-06-17 DIAGNOSIS — R293 Abnormal posture: Secondary | ICD-10-CM

## 2023-06-17 DIAGNOSIS — M6281 Muscle weakness (generalized): Secondary | ICD-10-CM

## 2023-06-17 DIAGNOSIS — M549 Dorsalgia, unspecified: Secondary | ICD-10-CM

## 2023-06-17 NOTE — Therapy (Signed)
OUTPATIENT PHYSICAL THERAPY THORACOLUMBAR TREATMENT/discharge   Department of [facility]  [id number] 15 visits approved from 12/18/22-04/17/23 [facility] Contact: Ardelia Mems [phone]   Patient Name: Toni Lewis MRN: [id number] DOB:10-02-1966, 57 y.o., female Today's Date: 06/17/2023  END OF SESSION:  PT End of Session - 06/17/23 0900     Visit Number 28    Number of Visits 28    Date for PT Re-Evaluation 06/18/23    Authorization Type VA (for first 15); BCBS and [employer] (for visit 16 and beyond)    PT Start Time 0845    PT Stop Time 0927    PT Time Calculation (min) 42 min    Activity Tolerance Patient tolerated treatment well    Behavior During Therapy [facility] for tasks assessed/performed                  Past Medical History:  Diagnosis Date   Anal fissure    Anxiety    Depression    Endometriosis    Headache    Hemorrhoids    Hypertension    Peptic ulcer disease    Past Surgical History:  Procedure Laterality Date   COLONOSCOPY  2022   ENDOMETRIAL ABLATION     LUMBAR LAMINECTOMY/ DECOMPRESSION WITH MET-RX Right 10/30/2021   Procedure: Right Lumbar Three-Four Lumbar Four-Five Laminotomy, Discectomy with metrx;  Surgeon: Barnett Abu, MD;  Location: [facility] OR;  Service: Neurosurgery;  Laterality: Right;   POSTERIOR CERVICAL LAMINECTOMY WITH MET- RX Bilateral 10/30/2021   Procedure: Bilateral Cervical Five-Six Foraminotomy w/Metrx;  Surgeon: Barnett Abu, MD;  Location: [facility] OR;  Service: Neurosurgery;  Laterality: Bilateral;   TONSILLECTOMY     Patient Active Problem List   Diagnosis Date Noted   Cervical disc disorder with radiculopathy of cervical region 10/30/2021   Benign essential HTN 02/01/2014   Chest pain 02/01/2014   Syncope, vasovagal 02/01/2014   Palpitations 02/01/2014    PCP: Greta Doom, MD   REFERRING PROVIDER: Greta Doom, MD   REFERRING DIAG:  M54.9 (ICD-10-CM) - Dorsalgia, unspecified   Rationale for Evaluation and  Treatment: Rehabilitation  THERAPY DIAG:  Dorsalgia of multiple sites in spine  Abnormal posture  Muscle weakness-general  ONSET DATE: >10 yrs  SUBJECTIVE:                                                                                                                                                                                           SUBJECTIVE STATEMENT: The patient has started at the gym. She tolerated the exercises well. She has been sore but overall moving more. Her MD wants her to do more water  but we advised her she has reached an independent point with water.  PERTINENT HISTORY:  10/30/21: Surgeon Barnett Abu     LUMBAR LAMINECTOMY/ DECOMPRESSION       POSTERIOR CERVICAL LAMINECTOMY  PAIN:  Are you having pain? Yes: NPRS scale: current 8/10; Pain location: generalized Pain description: N&T arms; right foot numbness, mid back to neck sharp Aggravating factors: walking, standing, showering, toileting Relieving factors: nothing right now; meds; ice packs  PRECAUTIONS: Cervical and Back  WEIGHT BEARING RESTRICTIONS: No  FALLS:  Has patient fallen in last 6 months? No  LIVING ENVIRONMENT: Lives with: lives with their family Lives in: House/apartment Stairs: No Has following equipment at home: Single point cane  OCCUPATION: disabled  PLOF: Needs assistance with ADLs  PATIENT GOALS: decrease pain, improve QOL, walk through grocery store  NEXT MD VISIT: TBA  OBJECTIVE:     PATIENT SURVEYS:  FOTO Primary score 28% with goal of 34% FOTO 03/25/23: 38%   SCREENING FOR RED FLAGS: No  COGNITION: Overall cognitive status: Within functional limits for tasks assessed     SENSATION: P&N throughout ue to hands and le through feet  MUSCLE LENGTH: Hamstrings: full knee extension in sitting   POSTURE: rounded, elevated shoulders, forward head, and flexed trunk   PALPATION: MS tightness and TTP throughout traps, post cervical spine, erector sp through  lumbar spine   Cervical ROM: extension -10d; flex 40d; rotation R/L ~20d 04/27/23:  cervical rotation:  L 38, R 31;  Lateral flexion L 15, R 20   LUMBAR ROM:   AROM eval 04/23/23  Flexion 25% full  Extension -20d -5d  Right lateral flexion 25%   Left lateral flexion 25%   Right rotation  ~20d  Left rotation  ~20d   (Blank rows = not tested)  LOWER Extremity  [facility]  LOWER EXTREMITY MMT:      Strength testing limited by pain MMT Right eval Left eval Right / Left 04/23/23 Right / Left 05/05/23  Hip flexion 3 3 3+/5 3+ -  4  Hip extension      Hip abduction 3 3 4/5 4/5  Hip adduction      Hip internal rotation      Hip external rotation      Knee flexion      Knee extension 3 3 4/5 4+/5  Ankle dorsiflexion      Ankle plantarflexion      Ankle inversion      Ankle eversion       (Blank rows = not tested)    FUNCTIONAL TESTS:  5 times sit to stand: unable to tolerate Timed up and go (TUG): 21.61 Berg Balance Scale: 22/56  04/02/23:  TUG 20.72s  04/23/23: Sharlene Motts: 32/56   5 x STS:26.24 with use of arms      05/05/23: TUG:19.41    05/14/23: TUG 16.06 sec;     5x STS (with use of arms) 24.26      GAIT: Distance walked: 200 Assistive device utilized: None Level of assistance: Modified independence Comments: Forward flex positioning, short step length, limited hip/knee flex with heel strike, slowed cadence, guarded  TODAY'S TREATMENT:  8/29 Nu-step 5 min  Ex ball stretch fwd and lateral x10 each 5 sec hold   Ball press x20   Reviewed use and ft of the exericse bike   Hamstring stretch 2x20 sec hold  Piriformis stretch 2x20 sec hold.   Reviewed complete HEP ; reviewed how to update HEP    8/20 Nu-step 5 min    Standing cable row 10 lbs 3x10  Standing cable extension 10lbs 3x10   All with breathing for TA activation    Narrow base  eyes open 3x30 sec hold CGA  NB EC 1x20 sec hold min a   Tandem stance 2x30 sec bilateral  Heel/toe rock with UE support 2x10    Updated HEP.    05/25/23 Pt seen for aquatic therapy today.  Treatment took place in water 3.5-4.75 ft in depth at the [facility] pool. Temp of water was 91.  Pt entered/exited the pool via stairs in step-to pattern with bilat rail.    Exercises - Walking   - Side Stepping - Drawing Bow and arrow - Standing Shoulder Horizontal Abduction with Resistance   - triceps press - Noodle press  - Forward Backward Tandem Walking   - Single Leg Stance  - Seated Straddle on Flotation Forward Breast Stroke Arms and Bicycle Legs   Pt requires the buoyancy and hydrostatic pressure of water for support, and to offload joints by unweighting joint load by at least 50 % in navel deep water and by at least 75-80% in chest to neck deep water.  Viscosity of the water is needed for resistance of strengthening. Water current perturbations provides challenge to standing balance requiring increased core activation. Last session: 8/12 Nu-step 5 min cuing for a slow stretch   LF Knee extension machine 3x10 10 lbs  LF hip abduction 25 3x10  lbs  LF Row 3x10 20 lbs  LF Triceps 3x15 25lbs   Reviewed set up of all machines and how to use core breathing. We reviewed machines she should avoind. She was given a sheet with her exercises.      PATIENT EDUCATION:  Education details: posture awareness, aquatic therapy exercise modifications Person educated: Patient Education method: Explanation Education comprehension: verbalized understanding  HOME EXERCISE PROGRAM: Access Code: [email or web address] URL: [email or web address] Date: 03/16/2023 Prepared by: Geni Bers  Exercises - Seated Cervical Rotation AROM  - 1 x daily - 7 x weekly - 3 sets - 10 reps - Seated Cervical Extension AROM  - 1 x daily - 7 x weekly - 3 sets - 10 reps - Seated Passive Cervical  Retraction  - 1 x daily - 7 x weekly - 3 sets - 10 reps - Seated Scapular Retraction  - 1 x daily - 7 x weekly - 3 sets - 10 reps   [id number]/Land Illene Labrador  ASSESSMENT:  CLINICAL IMPRESSION: The patient has started at the gym. She has a full exercise program. She also has a complete HEP in the pool. She was advised she needs to get in a consistent routine with each program whatever that may be. We updated her stretches. We reviewed exercise progression. We also reviewed how to grade her exercises. See below for goal specific progress.   Awaiting approval from [place] for additional visits; utilizing BCBS for visits after #15.      OBJECTIVE IMPAIRMENTS: Abnormal gait, decreased activity tolerance, decreased balance, decreased endurance, decreased mobility, difficulty walking, decreased ROM, decreased strength, impaired sensation, improper body mechanics, postural dysfunction, obesity, and pain.  ACTIVITY LIMITATIONS: carrying, lifting, bending, sitting, standing, squatting, sleeping, stairs, transfers, bed mobility, bathing, toileting, reach over head, locomotion level, and caring for others  PARTICIPATION LIMITATIONS: meal prep, cleaning, laundry, driving, shopping, community activity, occupation, and yard work  PERSONAL FACTORS: Fitness, Past/current experiences, and Time since onset of injury/illness/exacerbation are also affecting patient's functional outcome.   REHAB POTENTIAL: Good  CLINICAL DECISION MAKING: Evolving/moderate complexity  EVALUATION COMPLEXITY: Moderate   GOALS: Goals reviewed with patient? Yes  SHORT TERM GOALS: Target date: 04/16/23  Pt will tolerate full aquatic sessions consistently without increase in pain and with improving function to demonstrate good toleration and effectiveness of intervention.   Baseline: Goal status: MET - 04/02/23  2.  Pt will improve on Tug test to <or=  15s to demonstrate improvement in lower extremity function, mobility and  decreased fall risk.  Baseline:21.61  Goal status: IN PROGRESS 04/14/23; 19.41 05/05/23 Ongoing  3.  Pt will obtain DME's as needed to improve safety with ADL's and amb Baseline: received shower chair, reacher, and rollator  Goal status: met-  05/10/23  4.  Pt will have a decrease in her minimal pain to </= 4/10 Baseline: 8/10 at eval;  4/10 while in water  Goal status: Met 05/18/23  5.  Pt will report completing showering without assist and apprehensiveness of falling Baseline: afraid of falling/not wanting to ask for help Goal status: Met 04/14/23    LONG TERM GOALS: Target date: 06/18/23  Pt to meet stated Foto Goal of 34% Baseline: 28% Goal status:MET - 03/25/23  2.  Pt will improve strength in all areas listed by  1 grade (MMT, plan to measure using HHD when tolerated)  to demonstrate improved overall physical function  Baseline: see chart Goal status: In Progress 05/05/23  3.  Pt will improve on Berg balance test to >/=  50/56 to demonstrate a decrease in fall risk. Baseline: 22/56 at eval; (32/56 on 04/23/23) Goal status: In progress   4.  Pt will tolerate walking to and from setting and engaging in aquatic therapy session without excessive fatigue or increase in pain to demonstrate improved toleration to activity.  Baseline: pain and multiple rest periods Goal status: Met 04/23/23  5.  Pt will be able to tolerate walking through grocery store without increase in pain and without seated rest period to demonstrate improved toleration to activity Baseline: unable (personal goal)-eval;  walked 15 min in [employer] with increase in pain 05/05/23 Goal status: progressing but not met 8/29  6.  Pt will have an overall decrease in max pain to </= 4/10 for improved QOL Baseline: 8/10 Goal status: In progress 04/23/23  PLAN:  PT FREQUENCY: 1-2x/week  PT DURATION: 8 weeks   PLANNED INTERVENTIONS: Therapeutic exercises, Therapeutic activity, Neuromuscular re-education, Balance training, Gait  training, Patient/Family education, Self Care, Joint mobilization, Stair training, Orthotic/Fit training, DME instructions, Aquatic Therapy, Dry Needling, Cryotherapy, Moist heat, Taping, Ionotophoresis 4mg /ml Dexamethasone, Manual therapy, and Re-evaluation.  PLAN FOR NEXT SESSION: aquatic intervention for general strengthening and mobility, stretching/ROM, pain reduction, posture correction, gait cervical through lumbar spine. Land: DN as approp upper traps; core/le strengthening; stair climbing; posture correction   06/17/23 9:02 AM [facility] [address] [place], [place], [address] Phone: [phone]   Fax:  [phone]

## 2023-11-14 IMAGING — CT CT L SPINE W/ CM
3 of 12 series · 9 of 33 positions shown, 11 images · non-contrast
Comparison: none

CLINICAL DATA: Low back and right leg pain. Neck pain and bilateral
arm pain worse on the right than the left.
TECHNIQUE: Contiguous axial images were obtained through the Cervical,
Thoracic, and Lumbar spine after the intrathecal infusion of
infusion. Coronal and sagittal reconstructions were obtained of the
axial image sets.

[Series 5: l spine st thin (person_name) · axial · 0.28mm/px · z∈[-564,-564]mm · 1 of 374 slices shown, 2 images]
[im 187/374  soft-tissue]
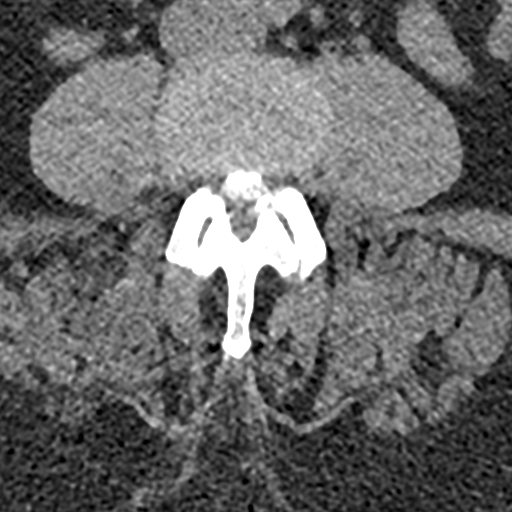
[im 187/374  bone]
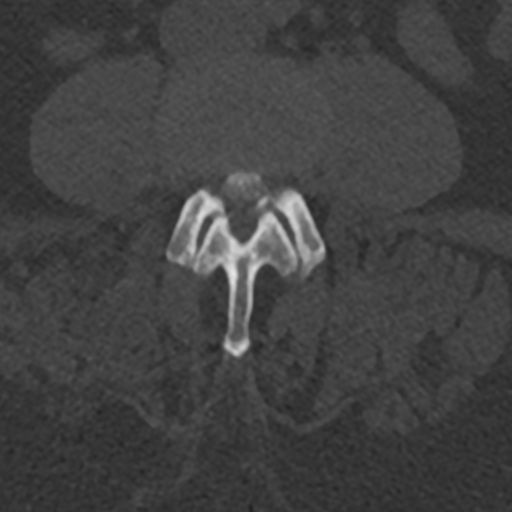

[Series 7: l spine (person_name) · coronal · 0.28mm/px · 3 of 72 slices shown]
[im 15/72  bone]
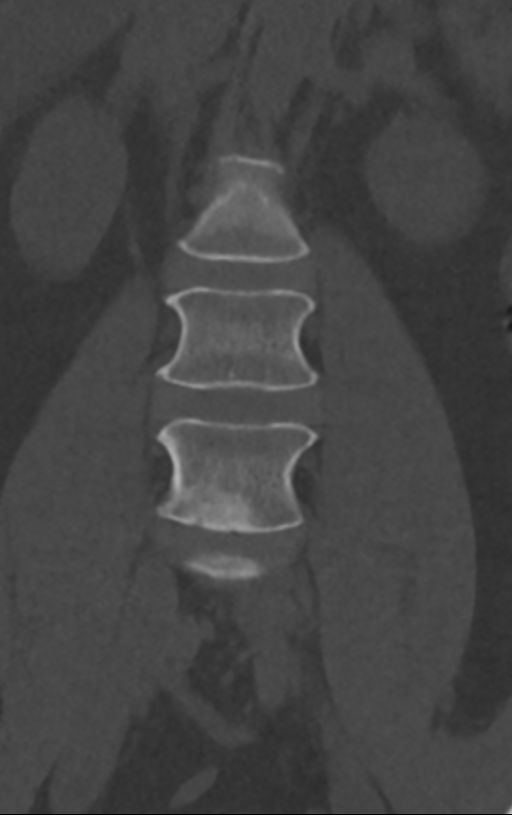
[im 29/72  bone]
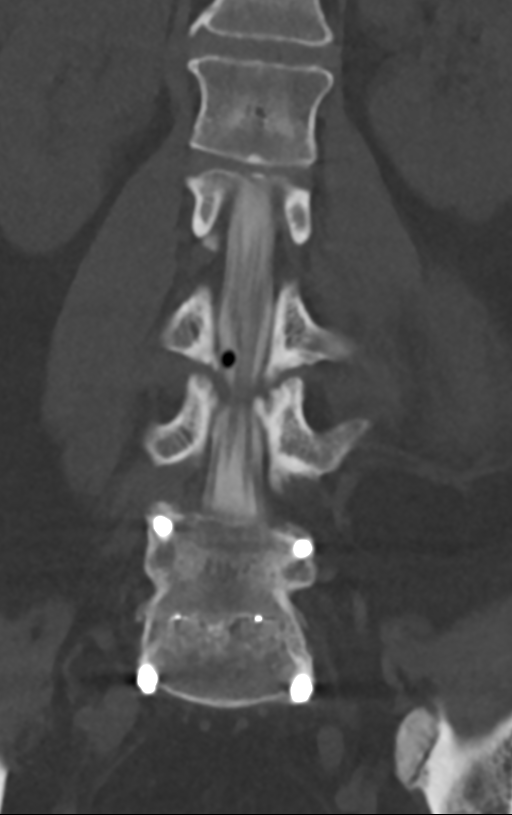
[im 43/72  bone]
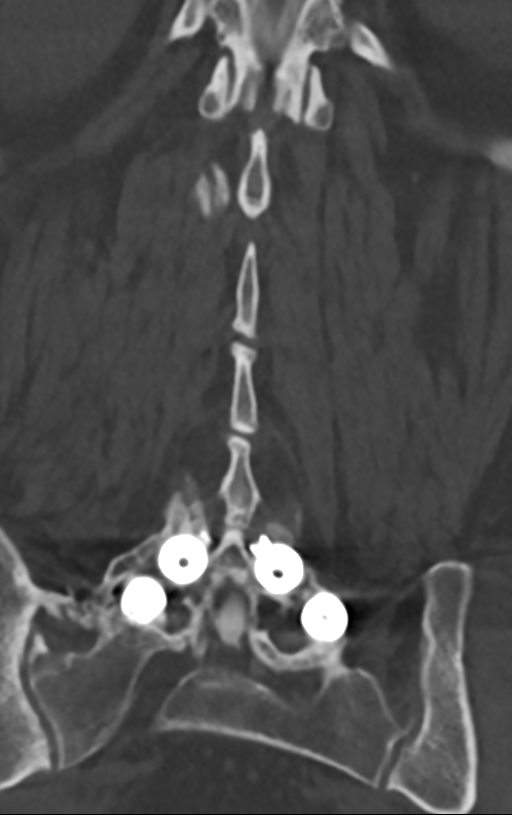

[Series 8: l spine bone sag · sagittal · 0.28mm/px · 5 of 72 slices shown, 6 images]
[im 24/72  bone]
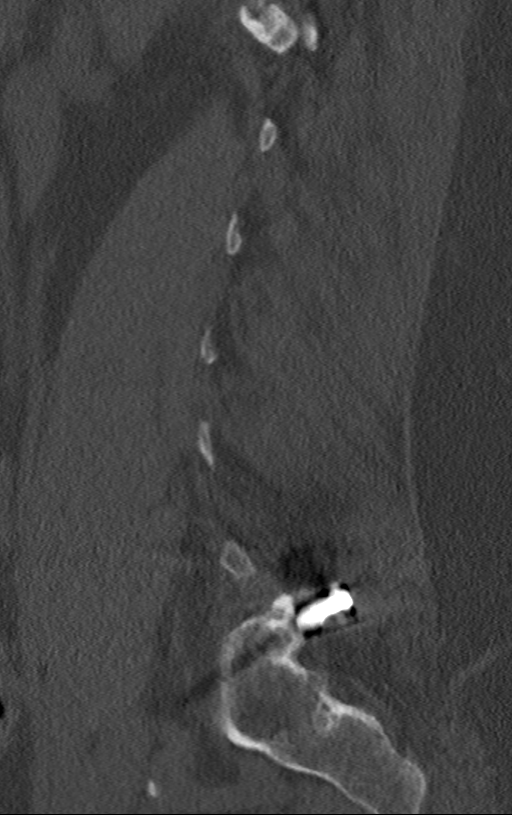
[im 30/72  bone]
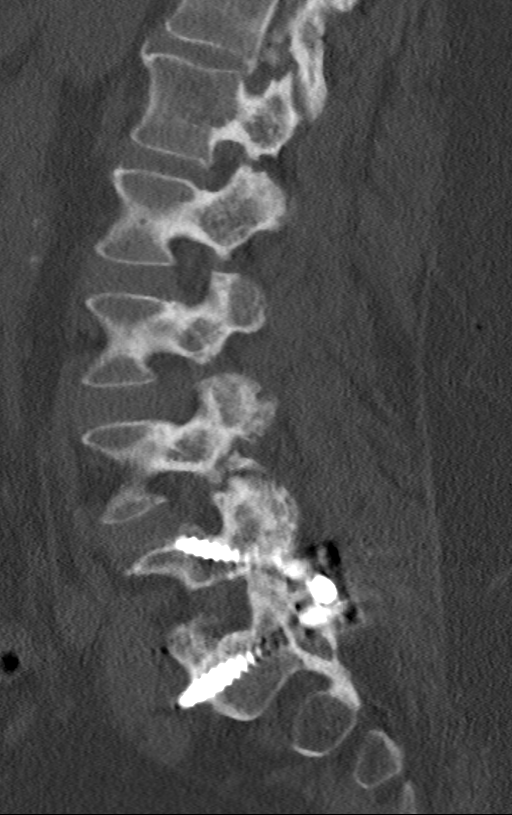
[im 36/72  soft-tissue]
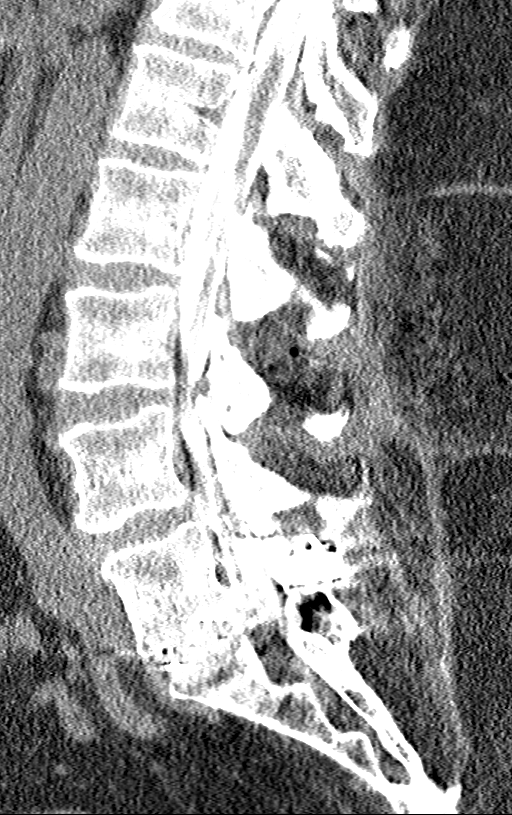
[im 36/72  bone]
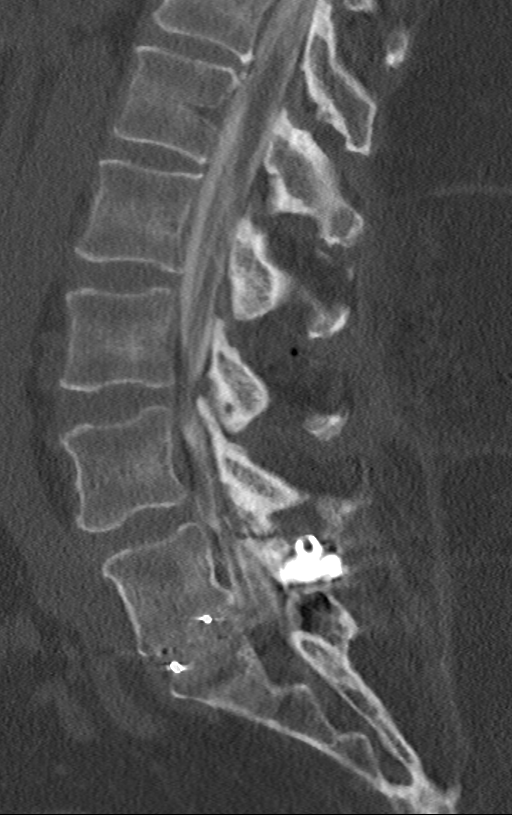
[im 42/72  bone]
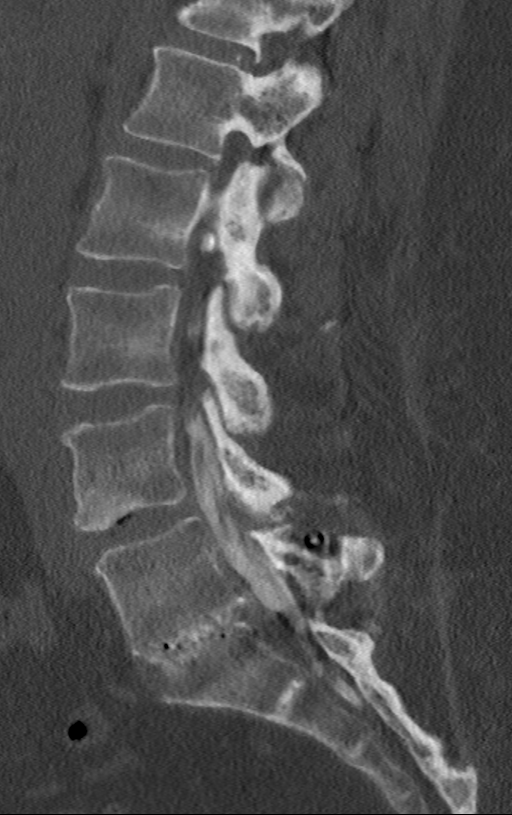
[im 48/72  bone]
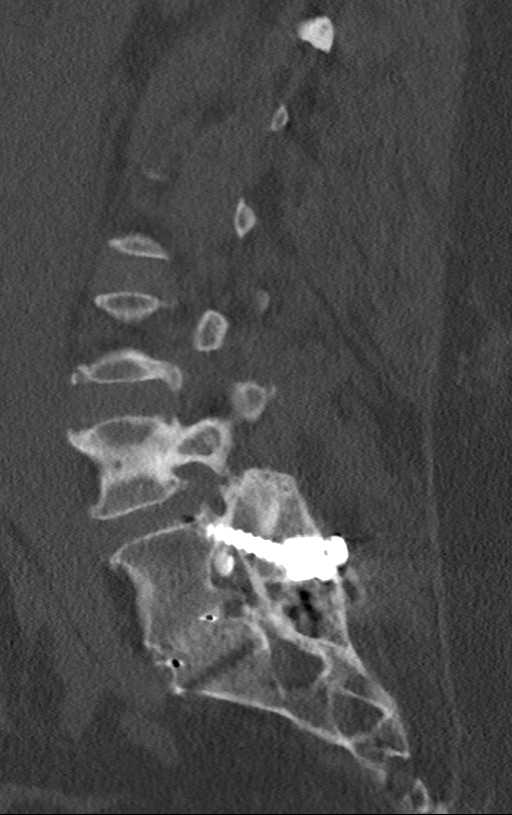

[9 of 33 positions shown; findings below may reference images not displayed]

FLUOROSCOPY:
2 minutes 2 seconds.  1381.02 micro gray meter squared

PROCEDURE:
LUMBAR PUNCTURE FOR CERVICAL LUMBAR AND THORACIC MYELOGRAM

CERVICAL AND LUMBAR AND THORACIC MYELOGRAM

CT CERVICAL MYELOGRAM

CT LUMBAR MYELOGRAM

CT THORACIC MYELOGRAM

Lumbar puncture and contrast injection was performed by Dr. Anuell.

I personally performed the  acquisition of the myelogram images.
FINDINGS: CERVICAL, THORACIC AND LUMBAR MYELOGRAM FINDINGS:

In the lumbar region, the patient has had previous discectomy and
fusion at L5-S1. The central spinal canal appears widely patent.
There is mild bilateral lateral recess narrowing at L3-4. No other
narrowing is seen. No sign of lumbar nerve compression. No hardware
complication evident at L5-S1. See results of CT study.

In the thoracic region, there is no block to the passage of
contrast. No evidence of extradural defect.

In the cervical region, the patient is head disc arthroplasty at
C4-5 and C5-6. No apparent stenosis of the canal. No visible neural
compression.

CT CERVICAL MYELOGRAM FINDINGS:

Foramen magnum is widely patent. Ordinary mild osteoarthritis at
C1-2 without encroachment upon the neural structures.

C2-3: Minimal uncovertebral prominence on the left. No canal or
foraminal stenosis.

C3-4: Minimal endplate osteophytes and bulging of the disc. No canal
or foraminal stenosis.

C4-5: Previous disc arthroplasty. Wide patency of the canal and
foramina.

C5-6: Previous disc arthroplasty. Wide patency of the canal and
foramina.

C6-7: Solid bridging anterior osteophytes. Wide patency of the canal
and foramina.

C7-T1: Solid bridging anterior osteophytes. Wide patency of the
canal and foramina.

CT LUMBAR MYELOGRAM FINDINGS:

L1-2: No disc abnormality.  Mild facet osteoarthritis.  No stenosis.

L2-3: No disc abnormality.  No canal or foraminal stenosis.

L3-4: Mild bulging of the disc. Facet and ligamentous hypertrophy.
Mild narrowing of the lateral recesses but without likely neural
compression. Osteophytic encroachment in the right lateral foraminal
to extraforaminal region could possibly affect the right L3 nerve.

L4-5: Bilateral facet arthropathy. Mild bulging of the disc.
Abnormal appearance of the right foraminal to extraforaminal region
which could be due to disc herniation. This would seemingly be
likely to affect the right L4 nerve. The facet arthropathy could
certainly be a cause of back pain or referred facet syndrome pain.
Note that the pedicle screws at L5 breech or nearly breach the
superior endplate of L5 but do not encounter the neural structures.
No sign of loosening or motion of the screws.

L5-S1: Previous discectomy and fusion. Fusion appears solid.
Sufficient patency of the canal and foramina.

Bilateral sacroiliac arthritis is present.

CT THORACIC MYELOGRAM FINDINGS:

No malalignment. Solid bridging anterior osteophytes from C7 through
T3. No definite solid bridging anteriorly at T4-5. Anterior bridging
from T5 through T9. There is facet fusion on the left from T7
through T8.

No disc herniation. No compressive stenosis of the canal or
foramina.

There chronic calcified disc protrusions on the right at T9-10 and
on the left at T7-8. Neither appears to cause neural compression.

At T11-12, there is disc degeneration and facet osteoarthritis, but
no compressive stenosis of the canal or foramina.

There is arthropathy of the costovertebral articulations throughout,
with solid ankylosis/bridging on the left at T5, bilateral at T7, on
the left at T8, bilateral at T9, on the left at T10 and on the right
at T12. Findings suggest some sort of spondyloarthropathy, possibly
diffuse idiopathic skeletal hyperostosis.
IMPRESSION: Cervical region: Good appearance of the disc arthroplasties at C4-5
and C5-6. No sign of stenosis or neural compression. No abnormality
seen which would be expected to be painful.

Thoracic region: No canal stenosis or compressive foraminal
stenosis. Pattern of bridging osteophytes and costovertebral
ankylosis suggesting spondyloarthropathy, possibly diffuse
idiopathic skeletal hyperostosis. Degenerative disc disease and
degenerative facet disease at the T11-12 level could be a cause of
local/regional pain, but does not appear to cause neural
compression.

Lumbar region: Solid union at the discectomy and fusion level of
L5-S1. Wide patency of the canal and foramina at that level.

Adjacent segment degenerative disease at L4-5. Advanced bilateral
facet arthropathy which could be painful. Right foraminal pathology
presumed to represent foraminal disc herniation likely to compress
the right L4 nerve.

Mild lateral recess narrowing at L3-4 but without visible neural
compression in the canal. Some encroachment upon the right lateral
foraminal to extraforaminal region by osteophyte and bulging disc
material could possibly affect the right L3 nerve.

Bilateral sacroiliac arthropathy with solid bridging osteophytes on
the left.

## 2023-11-14 IMAGING — CT CT CERVICAL SPINE W/ CM
3 of 6 series · 11 of 33 positions shown, 13 images · non-contrast
Comparison: none

CLINICAL DATA: Low back and right leg pain. Neck pain and bilateral
arm pain worse on the right than the left.
TECHNIQUE: Contiguous axial images were obtained through the Cervical,
Thoracic, and Lumbar spine after the intrathecal infusion of
infusion. Coronal and sagittal reconstructions were obtained of the
axial image sets.

[Series 6: c spine bone thin · axial · 0.28mm/px · z∈[-196,-115]mm · 3 of 271 slices shown, 4 images]
[im 68/271  soft-tissue]
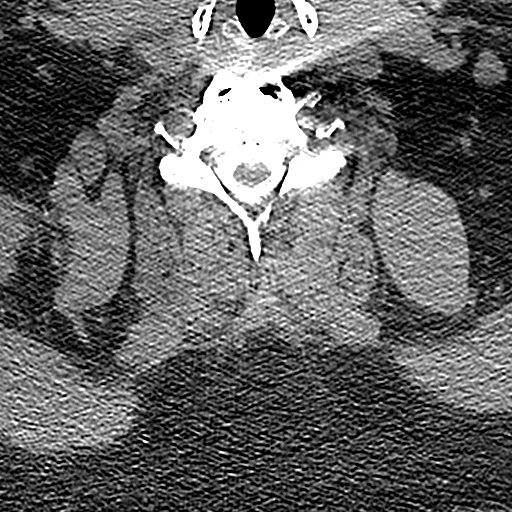
[im 68/271  bone]
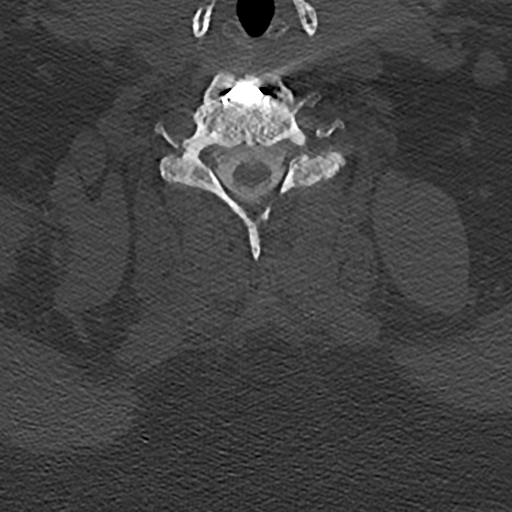
[im 136/271  bone]
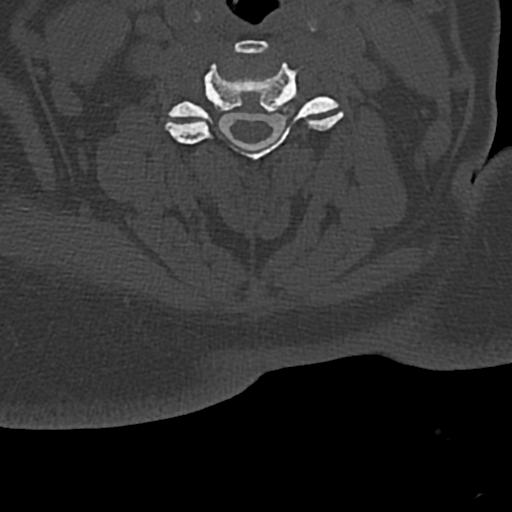
[im 203/271  bone]
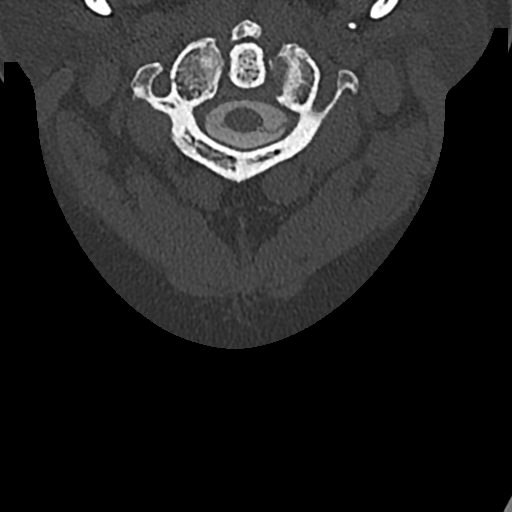

[Series 8: c spine bone cor · coronal · 0.28mm/px · 3 of 61 slices shown]
[im 13/61  bone]
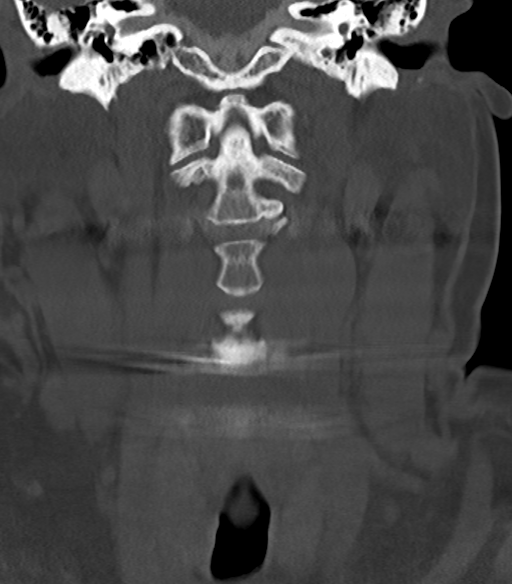
[im 25/61  bone]
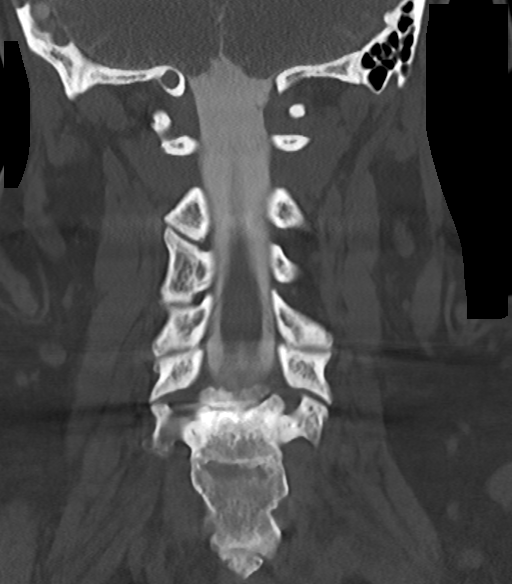
[im 37/61  bone]
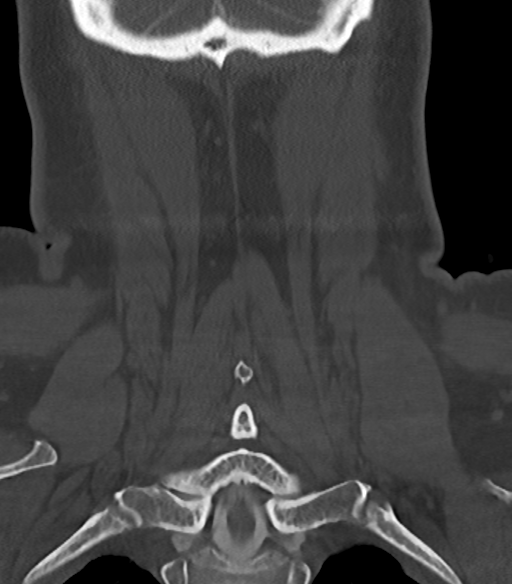

[Series 9: c spine bone sag · sagittal · 0.24mm/px · 5 of 72 slices shown, 6 images]
[im 24/72  bone]
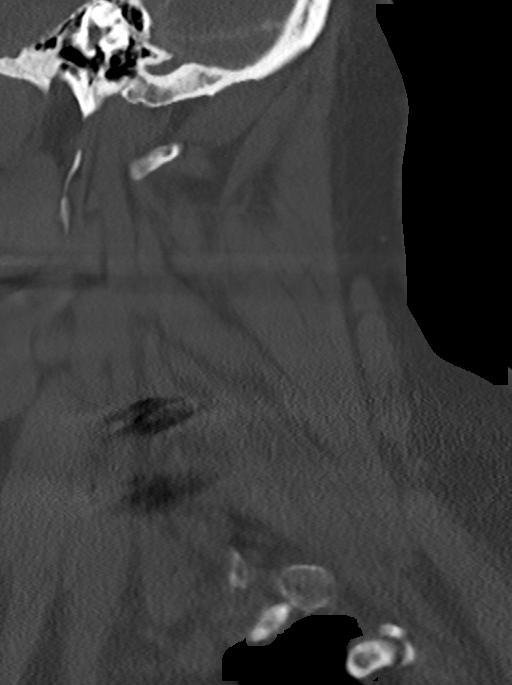
[im 30/72  bone]
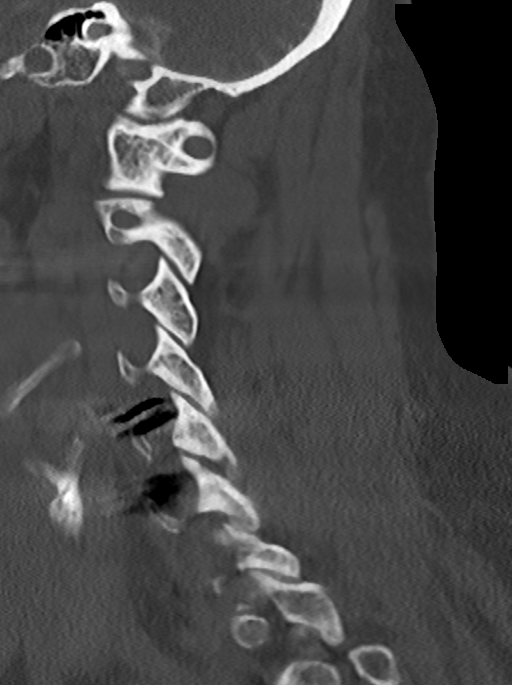
[im 36/72  soft-tissue]
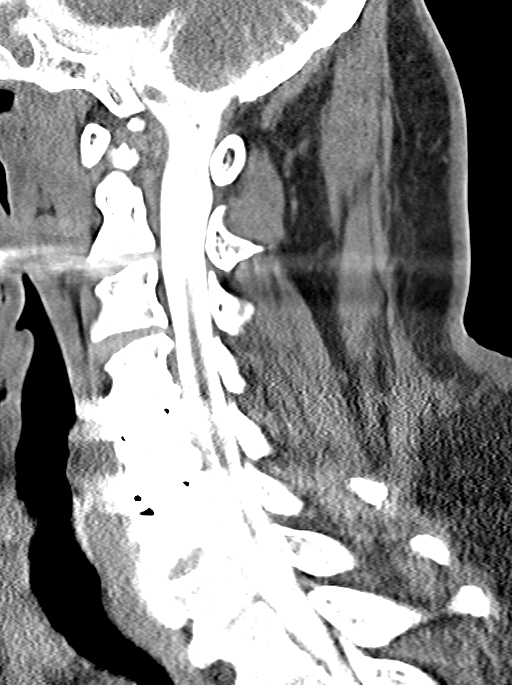
[im 36/72  bone]
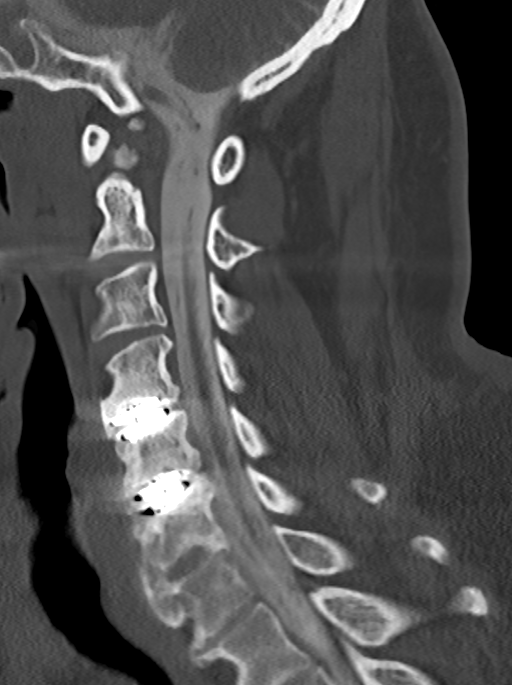
[im 42/72  bone]
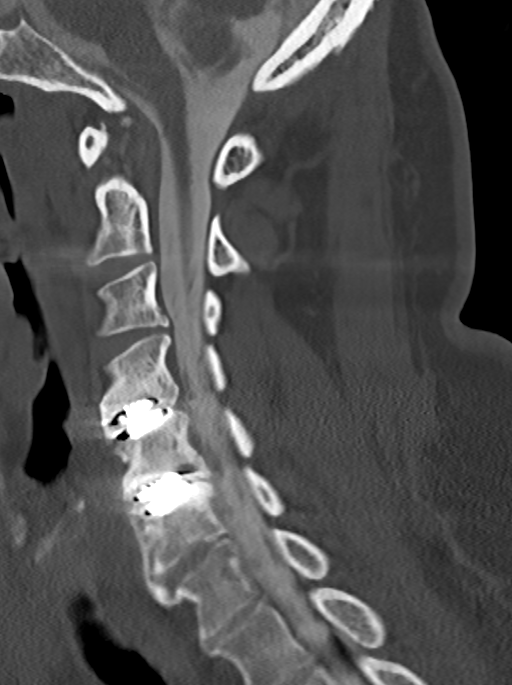
[im 48/72  bone]
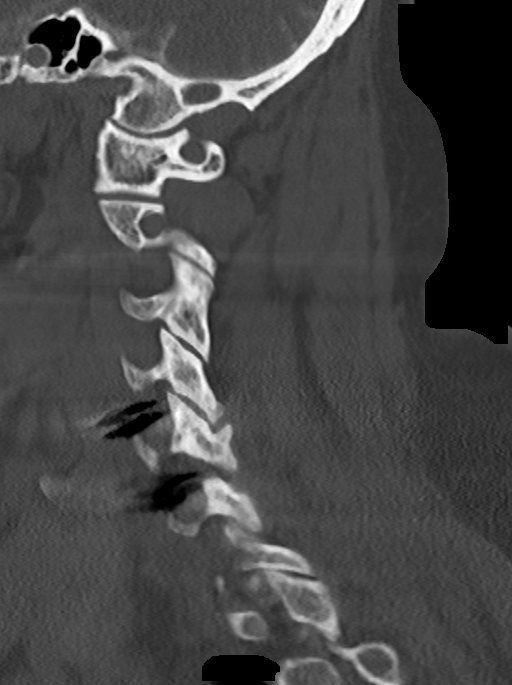

[11 of 33 positions shown; findings below may reference images not displayed]

FLUOROSCOPY:
2 minutes 2 seconds.  1381.02 micro gray meter squared

PROCEDURE:
LUMBAR PUNCTURE FOR CERVICAL LUMBAR AND THORACIC MYELOGRAM

CERVICAL AND LUMBAR AND THORACIC MYELOGRAM

CT CERVICAL MYELOGRAM

CT LUMBAR MYELOGRAM

CT THORACIC MYELOGRAM

Lumbar puncture and contrast injection was performed by Dr. Anuell.

I personally performed the  acquisition of the myelogram images.
FINDINGS: CERVICAL, THORACIC AND LUMBAR MYELOGRAM FINDINGS:

In the lumbar region, the patient has had previous discectomy and
fusion at L5-S1. The central spinal canal appears widely patent.
There is mild bilateral lateral recess narrowing at L3-4. No other
narrowing is seen. No sign of lumbar nerve compression. No hardware
complication evident at L5-S1. See results of CT study.

In the thoracic region, there is no block to the passage of
contrast. No evidence of extradural defect.

In the cervical region, the patient is head disc arthroplasty at
C4-5 and C5-6. No apparent stenosis of the canal. No visible neural
compression.

CT CERVICAL MYELOGRAM FINDINGS:

Foramen magnum is widely patent. Ordinary mild osteoarthritis at
C1-2 without encroachment upon the neural structures.

C2-3: Minimal uncovertebral prominence on the left. No canal or
foraminal stenosis.

C3-4: Minimal endplate osteophytes and bulging of the disc. No canal
or foraminal stenosis.

C4-5: Previous disc arthroplasty. Wide patency of the canal and
foramina.

C5-6: Previous disc arthroplasty. Wide patency of the canal and
foramina.

C6-7: Solid bridging anterior osteophytes. Wide patency of the canal
and foramina.

C7-T1: Solid bridging anterior osteophytes. Wide patency of the
canal and foramina.

CT LUMBAR MYELOGRAM FINDINGS:

L1-2: No disc abnormality.  Mild facet osteoarthritis.  No stenosis.

L2-3: No disc abnormality.  No canal or foraminal stenosis.

L3-4: Mild bulging of the disc. Facet and ligamentous hypertrophy.
Mild narrowing of the lateral recesses but without likely neural
compression. Osteophytic encroachment in the right lateral foraminal
to extraforaminal region could possibly affect the right L3 nerve.

L4-5: Bilateral facet arthropathy. Mild bulging of the disc.
Abnormal appearance of the right foraminal to extraforaminal region
which could be due to disc herniation. This would seemingly be
likely to affect the right L4 nerve. The facet arthropathy could
certainly be a cause of back pain or referred facet syndrome pain.
Note that the pedicle screws at L5 breech or nearly breach the
superior endplate of L5 but do not encounter the neural structures.
No sign of loosening or motion of the screws.

L5-S1: Previous discectomy and fusion. Fusion appears solid.
Sufficient patency of the canal and foramina.

Bilateral sacroiliac arthritis is present.

CT THORACIC MYELOGRAM FINDINGS:

No malalignment. Solid bridging anterior osteophytes from C7 through
T3. No definite solid bridging anteriorly at T4-5. Anterior bridging
from T5 through T9. There is facet fusion on the left from T7
through T8.

No disc herniation. No compressive stenosis of the canal or
foramina.

There chronic calcified disc protrusions on the right at T9-10 and
on the left at T7-8. Neither appears to cause neural compression.

At T11-12, there is disc degeneration and facet osteoarthritis, but
no compressive stenosis of the canal or foramina.

There is arthropathy of the costovertebral articulations throughout,
with solid ankylosis/bridging on the left at T5, bilateral at T7, on
the left at T8, bilateral at T9, on the left at T10 and on the right
at T12. Findings suggest some sort of spondyloarthropathy, possibly
diffuse idiopathic skeletal hyperostosis.
IMPRESSION: Cervical region: Good appearance of the disc arthroplasties at C4-5
and C5-6. No sign of stenosis or neural compression. No abnormality
seen which would be expected to be painful.

Thoracic region: No canal stenosis or compressive foraminal
stenosis. Pattern of bridging osteophytes and costovertebral
ankylosis suggesting spondyloarthropathy, possibly diffuse
idiopathic skeletal hyperostosis. Degenerative disc disease and
degenerative facet disease at the T11-12 level could be a cause of
local/regional pain, but does not appear to cause neural
compression.

Lumbar region: Solid union at the discectomy and fusion level of
L5-S1. Wide patency of the canal and foramina at that level.

Adjacent segment degenerative disease at L4-5. Advanced bilateral
facet arthropathy which could be painful. Right foraminal pathology
presumed to represent foraminal disc herniation likely to compress
the right L4 nerve.

Mild lateral recess narrowing at L3-4 but without visible neural
compression in the canal. Some encroachment upon the right lateral
foraminal to extraforaminal region by osteophyte and bulging disc
material could possibly affect the right L3 nerve.

Bilateral sacroiliac arthropathy with solid bridging osteophytes on
the left.

## 2024-06-16 ENCOUNTER — Other Ambulatory Visit: Payer: Self-pay | Admitting: "Endocrinology

## 2024-06-16 DIAGNOSIS — E042 Nontoxic multinodular goiter: Secondary | ICD-10-CM

## 2024-06-23 ENCOUNTER — Other Ambulatory Visit (HOSPITAL_COMMUNITY): Payer: Self-pay | Admitting: "Endocrinology

## 2024-06-23 DIAGNOSIS — E042 Nontoxic multinodular goiter: Secondary | ICD-10-CM

## 2024-07-12 ENCOUNTER — Ambulatory Visit (HOSPITAL_COMMUNITY)
Admission: RE | Admit: 2024-07-12 | Discharge: 2024-07-12 | Disposition: A | Discharge status: Home / Self Care | Payer: PRIVATE HEALTH INSURANCE | Source: Ambulatory Visit | Attending: "Endocrinology | Admitting: "Endocrinology

## 2024-07-12 DIAGNOSIS — E042 Nontoxic multinodular goiter: Secondary | ICD-10-CM | POA: Insufficient documentation

## 2024-07-12 MED ORDER — LIDOCAINE HCL (PF) 1 % IJ SOLN: 10.0000 mL | Freq: Once | INTRAMUSCULAR | Status: DC

## 2024-07-18 LAB — CYTOLOGY - NON PAP

## 2024-07-20 ENCOUNTER — Encounter (HOSPITAL_COMMUNITY): Payer: Self-pay

## 2024-07-21 ENCOUNTER — Other Ambulatory Visit (HOSPITAL_COMMUNITY): Payer: Self-pay | Admitting: "Endocrinology

## 2024-07-21 DIAGNOSIS — E042 Nontoxic multinodular goiter: Secondary | ICD-10-CM

## 2024-08-24 ENCOUNTER — Ambulatory Visit (HOSPITAL_COMMUNITY)
Admission: RE | Admit: 2024-08-24 | Discharge: 2024-08-24 | Disposition: A | Discharge status: Home / Self Care | Payer: PRIVATE HEALTH INSURANCE | Source: Ambulatory Visit | Attending: "Endocrinology | Admitting: "Endocrinology

## 2024-08-24 ENCOUNTER — Other Ambulatory Visit: Payer: Self-pay | Admitting: Family Medicine

## 2024-08-24 DIAGNOSIS — R928 Other abnormal and inconclusive findings on diagnostic imaging of breast: Secondary | ICD-10-CM

## 2024-08-24 NOTE — Progress Notes (Signed)
 Patient scheduled today for an image-guided thyroid  nodule biopsy however the IR team does not have access to the thyroid  ultrasound report and we are unsure which thyroid  nodule we need to biopsy. The order placed by Dr. Mady states left thyroid  nodules. The patient has several left-sided thyroid  nodules.   We do have the ultrasound images and these were reviewed by Dr. Jennefer. Per Dr. Jennefer there are no nodules that meet criteria for biopsy.   I called the patient and explained this information to her. She will call Dr. McClain for further discussion. She knows she can call our office back with any questions/concerns.   The procedure order will be deleted and the appointment will be cancelled.   Warren Dais, AGACNP-BC 08/24/2024, 11:04 AM

## 2024-08-29 ENCOUNTER — Other Ambulatory Visit: Payer: Self-pay | Admitting: Family Medicine

## 2024-08-29 DIAGNOSIS — R928 Other abnormal and inconclusive findings on diagnostic imaging of breast: Secondary | ICD-10-CM

## 2024-08-31 ENCOUNTER — Ambulatory Visit
Admission: RE | Admit: 2024-08-31 | Discharge: 2024-08-31 | Disposition: A | Discharge status: Home / Self Care | Payer: PRIVATE HEALTH INSURANCE | Source: Ambulatory Visit | Attending: Family Medicine | Admitting: Family Medicine

## 2024-08-31 ENCOUNTER — Ambulatory Visit: Payer: PRIVATE HEALTH INSURANCE

## 2024-08-31 DIAGNOSIS — R928 Other abnormal and inconclusive findings on diagnostic imaging of breast: Secondary | ICD-10-CM

## 2024-09-05 ENCOUNTER — Other Ambulatory Visit: Payer: PRIVATE HEALTH INSURANCE
